# Patient Record
Sex: Female | Born: 1960 | Race: White | Hispanic: No | Marital: Married | Smoking: Never smoker
Health system: Southern US, Community
[De-identification: ages and names within clinical notes are randomized; demographics above are authoritative.]

## PROBLEM LIST (undated history)

## (undated) DIAGNOSIS — R51 Headache: Secondary | ICD-10-CM

## (undated) DIAGNOSIS — R112 Nausea with vomiting, unspecified: Secondary | ICD-10-CM

## (undated) DIAGNOSIS — I951 Orthostatic hypotension: Secondary | ICD-10-CM

## (undated) DIAGNOSIS — M359 Systemic involvement of connective tissue, unspecified: Secondary | ICD-10-CM

## (undated) DIAGNOSIS — R000 Tachycardia, unspecified: Secondary | ICD-10-CM

## (undated) DIAGNOSIS — Z85528 Personal history of other malignant neoplasm of kidney: Secondary | ICD-10-CM

## (undated) DIAGNOSIS — C801 Malignant (primary) neoplasm, unspecified: Secondary | ICD-10-CM

## (undated) DIAGNOSIS — I10 Essential (primary) hypertension: Secondary | ICD-10-CM

## (undated) DIAGNOSIS — R5382 Chronic fatigue, unspecified: Secondary | ICD-10-CM

## (undated) DIAGNOSIS — R519 Headache, unspecified: Secondary | ICD-10-CM

## (undated) DIAGNOSIS — J45909 Unspecified asthma, uncomplicated: Secondary | ICD-10-CM

## (undated) DIAGNOSIS — J069 Acute upper respiratory infection, unspecified: Secondary | ICD-10-CM

## (undated) DIAGNOSIS — M797 Fibromyalgia: Secondary | ICD-10-CM

## (undated) DIAGNOSIS — N3010 Interstitial cystitis (chronic) without hematuria: Secondary | ICD-10-CM

## (undated) DIAGNOSIS — G90A Postural orthostatic tachycardia syndrome (POTS): Secondary | ICD-10-CM

## (undated) DIAGNOSIS — A0811 Acute gastroenteropathy due to Norwalk agent: Secondary | ICD-10-CM

## (undated) DIAGNOSIS — J189 Pneumonia, unspecified organism: Secondary | ICD-10-CM

## (undated) DIAGNOSIS — E039 Hypothyroidism, unspecified: Secondary | ICD-10-CM

## (undated) DIAGNOSIS — M1990 Unspecified osteoarthritis, unspecified site: Secondary | ICD-10-CM

## (undated) DIAGNOSIS — L509 Urticaria, unspecified: Secondary | ICD-10-CM

## (undated) DIAGNOSIS — G709 Myoneural disorder, unspecified: Secondary | ICD-10-CM

## (undated) DIAGNOSIS — K219 Gastro-esophageal reflux disease without esophagitis: Secondary | ICD-10-CM

## (undated) DIAGNOSIS — Z9889 Other specified postprocedural states: Secondary | ICD-10-CM

## (undated) DIAGNOSIS — E079 Disorder of thyroid, unspecified: Secondary | ICD-10-CM

## (undated) DIAGNOSIS — I498 Other specified cardiac arrhythmias: Secondary | ICD-10-CM

## (undated) HISTORY — PX: TONSILLECTOMY: SUR1361

## (undated) HISTORY — PX: BREAST BIOPSY: SHX20

## (undated) HISTORY — DX: Unspecified osteoarthritis, unspecified site: M19.90

## (undated) HISTORY — PX: CYSTOSCOPY: SUR368

## (undated) HISTORY — DX: Disorder of thyroid, unspecified: E07.9

## (undated) HISTORY — DX: Acute upper respiratory infection, unspecified: J06.9

## (undated) HISTORY — DX: Urticaria, unspecified: L50.9

## (undated) HISTORY — DX: Malignant (primary) neoplasm, unspecified: C80.1

## (undated) HISTORY — PX: OTHER SURGICAL HISTORY: SHX169

## (undated) HISTORY — PX: COLONOSCOPY: SHX174

## (undated) HISTORY — PX: GASTRECTOMY: SHX58

## (undated) HISTORY — DX: Systemic involvement of connective tissue, unspecified: M35.9

## (undated) HISTORY — DX: Unspecified asthma, uncomplicated: J45.909

---

## 1997-02-14 HISTORY — PX: ABDOMINAL HYSTERECTOMY: SHX81

## 1997-04-21 ENCOUNTER — Inpatient Hospital Stay (HOSPITAL_COMMUNITY): Admission: RE | Admit: 1997-04-21 | Discharge: 1997-04-23 | Payer: Self-pay | Admitting: Obstetrics and Gynecology

## 1998-09-18 ENCOUNTER — Other Ambulatory Visit: Admission: RE | Admit: 1998-09-18 | Discharge: 1998-09-18 | Payer: Self-pay | Admitting: Obstetrics and Gynecology

## 2000-01-21 ENCOUNTER — Encounter: Payer: Self-pay | Admitting: Internal Medicine

## 2000-01-21 ENCOUNTER — Encounter: Admission: RE | Admit: 2000-01-21 | Discharge: 2000-01-21 | Payer: Self-pay | Admitting: Internal Medicine

## 2001-06-11 ENCOUNTER — Encounter: Payer: Self-pay | Admitting: Emergency Medicine

## 2001-06-11 ENCOUNTER — Emergency Department (HOSPITAL_COMMUNITY): Admission: EM | Admit: 2001-06-11 | Discharge: 2001-06-11 | Payer: Self-pay | Admitting: *Deleted

## 2002-05-06 ENCOUNTER — Emergency Department (HOSPITAL_COMMUNITY): Admission: EM | Admit: 2002-05-06 | Discharge: 2002-05-06 | Payer: Self-pay | Admitting: Emergency Medicine

## 2002-06-13 ENCOUNTER — Other Ambulatory Visit: Admission: RE | Admit: 2002-06-13 | Discharge: 2002-06-13 | Payer: Self-pay | Admitting: Obstetrics and Gynecology

## 2003-03-19 ENCOUNTER — Encounter: Admission: RE | Admit: 2003-03-19 | Discharge: 2003-03-19 | Payer: Self-pay | Admitting: Internal Medicine

## 2003-07-10 ENCOUNTER — Other Ambulatory Visit: Admission: RE | Admit: 2003-07-10 | Discharge: 2003-07-10 | Payer: Self-pay | Admitting: Obstetrics and Gynecology

## 2004-05-27 ENCOUNTER — Other Ambulatory Visit: Admission: RE | Admit: 2004-05-27 | Discharge: 2004-05-27 | Payer: Self-pay | Admitting: Obstetrics and Gynecology

## 2004-05-31 ENCOUNTER — Encounter: Admission: RE | Admit: 2004-05-31 | Discharge: 2004-05-31 | Payer: Self-pay | Admitting: Internal Medicine

## 2004-10-11 ENCOUNTER — Encounter: Admission: RE | Admit: 2004-10-11 | Discharge: 2004-10-11 | Payer: Self-pay | Admitting: Internal Medicine

## 2005-03-31 ENCOUNTER — Other Ambulatory Visit: Admission: RE | Admit: 2005-03-31 | Discharge: 2005-03-31 | Payer: Self-pay | Admitting: Obstetrics and Gynecology

## 2005-04-22 ENCOUNTER — Encounter: Admission: RE | Admit: 2005-04-22 | Discharge: 2005-04-22 | Payer: Self-pay | Admitting: Internal Medicine

## 2005-08-30 ENCOUNTER — Encounter: Admission: RE | Admit: 2005-08-30 | Discharge: 2005-08-30 | Payer: Self-pay | Admitting: Otolaryngology

## 2006-05-10 ENCOUNTER — Emergency Department (HOSPITAL_COMMUNITY): Admission: EM | Admit: 2006-05-10 | Discharge: 2006-05-11 | Payer: Self-pay | Admitting: Emergency Medicine

## 2006-11-13 ENCOUNTER — Encounter: Admission: RE | Admit: 2006-11-13 | Discharge: 2006-11-13 | Payer: Self-pay | Admitting: Gastroenterology

## 2008-03-18 ENCOUNTER — Ambulatory Visit: Payer: Self-pay | Admitting: Pulmonary Disease

## 2008-03-18 DIAGNOSIS — G43909 Migraine, unspecified, not intractable, without status migrainosus: Secondary | ICD-10-CM | POA: Insufficient documentation

## 2008-03-18 DIAGNOSIS — R059 Cough, unspecified: Secondary | ICD-10-CM | POA: Insufficient documentation

## 2008-03-18 DIAGNOSIS — R05 Cough: Secondary | ICD-10-CM

## 2008-03-18 DIAGNOSIS — I10 Essential (primary) hypertension: Secondary | ICD-10-CM | POA: Insufficient documentation

## 2008-03-28 ENCOUNTER — Ambulatory Visit (HOSPITAL_BASED_OUTPATIENT_CLINIC_OR_DEPARTMENT_OTHER): Admission: RE | Admit: 2008-03-28 | Discharge: 2008-03-28 | Payer: Self-pay | Admitting: Pulmonary Disease

## 2008-03-28 ENCOUNTER — Ambulatory Visit: Payer: Self-pay | Admitting: Diagnostic Radiology

## 2008-03-28 ENCOUNTER — Telehealth (INDEPENDENT_AMBULATORY_CARE_PROVIDER_SITE_OTHER): Payer: Self-pay | Admitting: *Deleted

## 2009-01-21 ENCOUNTER — Encounter: Admission: RE | Admit: 2009-01-21 | Discharge: 2009-01-21 | Payer: Self-pay | Admitting: Internal Medicine

## 2009-02-14 HISTORY — PX: KIDNEY SURGERY: SHX687

## 2009-03-05 ENCOUNTER — Ambulatory Visit: Payer: Self-pay | Admitting: Oncology

## 2009-03-16 ENCOUNTER — Emergency Department (HOSPITAL_BASED_OUTPATIENT_CLINIC_OR_DEPARTMENT_OTHER): Admission: EM | Admit: 2009-03-16 | Discharge: 2009-03-16 | Payer: Self-pay | Admitting: Emergency Medicine

## 2009-03-20 LAB — BASIC METABOLIC PANEL
CO2: 27 mEq/L (ref 19–32)
Calcium: 9 mg/dL (ref 8.4–10.5)
Glucose, Bld: 120 mg/dL — ABNORMAL HIGH (ref 70–99)
Potassium: 3.3 mEq/L — ABNORMAL LOW (ref 3.5–5.3)
Sodium: 139 mEq/L (ref 135–145)

## 2009-03-23 ENCOUNTER — Ambulatory Visit (HOSPITAL_COMMUNITY): Admission: RE | Admit: 2009-03-23 | Discharge: 2009-03-23 | Payer: Self-pay | Admitting: Oncology

## 2009-03-26 LAB — BASIC METABOLIC PANEL
BUN: 21 mg/dL (ref 6–23)
CO2: 20 mEq/L (ref 19–32)
Calcium: 9.3 mg/dL (ref 8.4–10.5)
Creatinine, Ser: 1.09 mg/dL (ref 0.40–1.20)
Glucose, Bld: 94 mg/dL (ref 70–99)
Potassium: 4.2 mEq/L (ref 3.5–5.3)

## 2009-08-12 ENCOUNTER — Ambulatory Visit: Payer: Self-pay | Admitting: Oncology

## 2009-09-15 ENCOUNTER — Ambulatory Visit: Payer: Self-pay | Admitting: Oncology

## 2009-11-09 ENCOUNTER — Encounter: Admission: RE | Admit: 2009-11-09 | Discharge: 2009-11-09 | Payer: Self-pay | Admitting: Orthopedic Surgery

## 2010-03-07 ENCOUNTER — Encounter: Payer: Self-pay | Admitting: Otolaryngology

## 2010-03-07 ENCOUNTER — Encounter: Payer: Self-pay | Admitting: Oncology

## 2010-05-02 LAB — BASIC METABOLIC PANEL
BUN: 12 mg/dL (ref 6–23)
Calcium: 9 mg/dL (ref 8.4–10.5)
Creatinine, Ser: 0.8 mg/dL (ref 0.4–1.2)
GFR calc non Af Amer: >60 mL/min (ref >60)
Glucose, Bld: 90 mg/dL (ref 70–99)

## 2010-05-02 LAB — CBC
HCT: 39.6 % (ref 36.0–46.0)
MCHC: 34.5 g/dL (ref 30.0–36.0)
MCV: 86.4 fL (ref 78.0–100.0)
RBC: 4.58 MIL/uL (ref 3.87–5.11)

## 2010-05-02 LAB — DIFFERENTIAL
Basophils Absolute: 0 10*3/uL (ref 0.0–0.1)
Eosinophils Absolute: 0.2 10*3/uL (ref 0.0–0.7)
Lymphs Abs: 2.7 10*3/uL (ref 0.7–4.0)
Monocytes Relative: 10 % (ref 3–12)
Neutro Abs: 4.3 10*3/uL (ref 1.7–7.7)

## 2010-05-02 LAB — WOUND CULTURE: Gram Stain: NONE SEEN

## 2010-08-30 ENCOUNTER — Ambulatory Visit (HOSPITAL_BASED_OUTPATIENT_CLINIC_OR_DEPARTMENT_OTHER): Payer: 59 | Attending: Rheumatology

## 2010-08-30 DIAGNOSIS — G4710 Hypersomnia, unspecified: Secondary | ICD-10-CM | POA: Insufficient documentation

## 2010-09-04 DIAGNOSIS — G4730 Sleep apnea, unspecified: Secondary | ICD-10-CM

## 2010-09-04 DIAGNOSIS — G4710 Hypersomnia, unspecified: Secondary | ICD-10-CM

## 2010-09-04 NOTE — Procedures (Signed)
Angel Sellers, Angel Sellers                ACCOUNT NO.:  [id number]  MEDICAL RECORD NO.:  [id number]          PATIENT TYPE:  OUT  LOCATION:  SLEEP CENTER                 FACILITY:  [facility]  PHYSICIAN:  Dillon Mcreynolds D. Maple Hudson, MD, FCCP, FACPDATE OF BIRTH:  07-28-60  DATE OF STUDY:  08/30/2010                           NOCTURNAL POLYSOMNOGRAM  REFERRING PHYSICIAN:  Pollyann Savoy, M.D.  INDICATION FOR STUDY:  Hypersomnia with sleep apnea.  EPWORTH SLEEPINESS SCORE:  18/24.  BMI of 24.5.  Weight 147 pounds, height 65 inches.  Neck 13 inches.  MEDICATIONS:  Home medications are charted and reviewed.  SLEEP ARCHITECTURE:  Total sleep time 359.5 minutes with sleep efficiency 89.5%.  Stage I was 3.6%, stage II 70.2%, stage III 20.9%, REM 5.3% of total sleep time.  Sleep latency 19.5 minutes, REM latency 246 minutes, awake after sleep onset 22.5 minutes, arousal index 18.5.  BEDTIME MEDICATION:  None.  RESPIRATORY DATA:  Apnea/hypopnea index (AHI) 0.7 per hour.  A total of four events was scored including two central apneas and two hypopneas. Events were not positional.  OXYGEN DATA:  Moderate snoring with oxygen desaturation to a nadir of 90% and a mean oxygen saturation through the study of 95.1% on room air.  CARDIAC DATA:  Normal sinus rhythm.  MOVEMENT-PARASOMNIA:  No significant movement disturbance.  Bathroom x1.  IMPRESSIONS-RECOMMENDATIONS: 1. Sleep architecture was noteworthy for relatively high percentage of     time spent in stage III and low percentage of time spent in REM;     however, recognized this is a single night in an unfamiliar     environment, so these proportions may be unrepresentative. 2. Occasional respiratory event with sleep disturbance, within normal     limits, AHI 0.7 per hour (normal range 0-5 per hour).  Moderate     snoring with oxygen desaturation to a nadir of 90% and a mean     oxygen saturation     through the study of 95.1% on room air. 3. No specific  sleep disorder or therapeutic intervention is indicated     by the study.     Jmichael Gille D. Maple Hudson, MD, FCCP, FACP Diplomate, Biomedical engineer of Sleep Medicine Electronically Signed    CDY/MEDQ  D:  09/04/2010 13:50:00  T:  09/04/2010 22:45:18  Job:  [id number]

## 2011-05-04 ENCOUNTER — Other Ambulatory Visit: Payer: Self-pay

## 2011-05-04 ENCOUNTER — Emergency Department (HOSPITAL_BASED_OUTPATIENT_CLINIC_OR_DEPARTMENT_OTHER)
Admission: EM | Admit: 2011-05-04 | Discharge: 2011-05-04 | Disposition: A | Discharge status: Home / Self Care | Payer: 59 | Attending: Emergency Medicine | Admitting: Emergency Medicine

## 2011-05-04 ENCOUNTER — Encounter (HOSPITAL_BASED_OUTPATIENT_CLINIC_OR_DEPARTMENT_OTHER): Payer: Self-pay | Admitting: Student

## 2011-05-04 DIAGNOSIS — R55 Syncope and collapse: Secondary | ICD-10-CM | POA: Insufficient documentation

## 2011-05-04 DIAGNOSIS — Z85528 Personal history of other malignant neoplasm of kidney: Secondary | ICD-10-CM | POA: Insufficient documentation

## 2011-05-04 DIAGNOSIS — Z79899 Other long term (current) drug therapy: Secondary | ICD-10-CM | POA: Insufficient documentation

## 2011-05-04 DIAGNOSIS — R197 Diarrhea, unspecified: Secondary | ICD-10-CM | POA: Insufficient documentation

## 2011-05-04 DIAGNOSIS — R110 Nausea: Secondary | ICD-10-CM | POA: Insufficient documentation

## 2011-05-04 HISTORY — DX: Fibromyalgia: M79.7

## 2011-05-04 HISTORY — DX: Other specified cardiac arrhythmias: I49.8

## 2011-05-04 HISTORY — DX: Personal history of other malignant neoplasm of kidney: Z85.528

## 2011-05-04 HISTORY — DX: Orthostatic hypotension: I95.1

## 2011-05-04 HISTORY — DX: Postural orthostatic tachycardia syndrome (POTS): G90.A

## 2011-05-04 HISTORY — DX: Tachycardia, unspecified: R00.0

## 2011-05-04 LAB — COMPREHENSIVE METABOLIC PANEL
ALT: 14 U/L (ref 0–35)
AST: 18 U/L (ref 0–37)
Albumin: 4.5 g/dL (ref 3.5–5.2)
Alkaline Phosphatase: 57 U/L (ref 39–117)
BUN: 16 mg/dL (ref 6–23)
Creatinine, Ser: 0.9 mg/dL (ref 0.50–1.10)
GFR calc Af Amer: 85 mL/min — ABNORMAL LOW (ref >90)
Sodium: 137 mEq/L (ref 135–145)
Total Protein: 7.9 g/dL (ref 6.0–8.3)

## 2011-05-04 LAB — URINALYSIS, ROUTINE W REFLEX MICROSCOPIC
Hgb urine dipstick: NEGATIVE
Ketones, ur: NEGATIVE mg/dL
Nitrite: NEGATIVE
Protein, ur: NEGATIVE mg/dL

## 2011-05-04 LAB — CBC
MCV: 84 fL (ref 78.0–100.0)
Platelets: 177 10*3/uL (ref 150–400)
RBC: 5.01 MIL/uL (ref 3.87–5.11)
RDW: 13.3 % (ref 11.5–15.5)

## 2011-05-04 MED ORDER — DIPHENOXYLATE-ATROPINE 2.5-0.025 MG PO TABS
2.0000 | ORAL_TABLET | Freq: Once | ORAL | Status: AC
  Administered 2011-05-04: 2 via ORAL
  Filled 2011-05-04: qty 2

## 2011-05-04 MED ORDER — SODIUM CHLORIDE 0.9 % IV BOLUS (SEPSIS)
1000.0000 mL | Freq: Once | INTRAVENOUS | Status: AC
  Administered 2011-05-04: 1000 mL via INTRAVENOUS

## 2011-05-04 MED ORDER — ONDANSETRON HCL 4 MG/2ML IJ SOLN
4.0000 mg | Freq: Once | INTRAMUSCULAR | Status: AC
  Administered 2011-05-04: 4 mg via INTRAVENOUS
  Filled 2011-05-04: qty 2

## 2011-05-04 MED ORDER — SODIUM CHLORIDE 0.9 % IV BOLUS (SEPSIS)
2000.0000 mL | Freq: Once | INTRAVENOUS | Status: AC
  Administered 2011-05-04: 1000 mL via INTRAVENOUS

## 2011-05-04 NOTE — ED Provider Notes (Signed)
History     CSN: [id number]  Arrival date & time 05/04/11  1654   First MD Initiated Contact with Patient 05/04/11 1716      Chief Complaint  Patient presents with  . Loss of Consciousness  . Diarrhea    (Consider location/radiation/quality/duration/timing/severity/associated sxs/prior treatment) HPI Comments: Pt states that for the last 2 days she has had watery diarrhea to many episodes to count:pt states some discomfort prior to each diarrhea:pt states that she was scheduled for a tilt test today because of her history of orthostatic vitals and she passed out while in the office:pt states that she stood up here after diarrhea and had another syncopal episode  Patient is a 51 y.o. female presenting with diarrhea and syncope. The history is provided by the patient. No language interpreter was used.  Diarrhea The primary symptoms include nausea and diarrhea. Primary symptoms do not include fever or vomiting. The illness began 2 days ago. The onset was sudden. The problem has not changed since onset. The illness does not include constipation or back pain.  Loss of Consciousness This is a new problem. The current episode started today. The problem occurs 2 to 4 times per day. The problem has been unchanged. Associated symptoms include nausea. Pertinent negatives include no fever or vomiting. The symptoms are aggravated by standing. She has tried nothing for the symptoms.    Past Medical History  Diagnosis Date  . History of kidney cancer   . Fibromyalgia   . POTS (postural orthostatic tachycardia syndrome)     Past Surgical History  Procedure Date  . Kidney surgery     History reviewed. No pertinent family history.  History  Substance Use Topics  . Smoking status: Never Smoker   . Smokeless tobacco: Not on file  . Alcohol Use: No    OB History    Grav Para Term Preterm Abortions TAB SAB Ect Mult Living                  Review of Systems  Constitutional: Negative  for fever.  Cardiovascular: Positive for syncope.  Gastrointestinal: Positive for nausea and diarrhea. Negative for vomiting and constipation.  Musculoskeletal: Negative for back pain.  All other systems reviewed and are negative.    Allergies  Morphine  Home Medications   Current Outpatient Rx  Name Route Sig Dispense Refill  . VITAMIN B COMPLEX IJ Injection Inject 1 mL as directed 4 (four) times a week.    . DULOXETINE HCL 60 MG PO CPEP Oral Take 60 mg by mouth daily.    Marland Kitchen LEVOTHYROXINE SODIUM 75 MCG PO TABS Oral Take 75 mcg by mouth daily.    Marland Kitchen METHOCARBAMOL 500 MG PO TABS Oral Take 500 mg by mouth 4 (four) times daily. Patient uses this medication for pain.  She also states that she takes this medication prn.    . OMEPRAZOLE 40 MG PO CPDR Oral Take 40 mg by mouth daily.    Marland Kitchen PENTOSAN POLYSULFATE SODIUM 100 MG PO CAPS Oral Take 100 mg by mouth 3 (three) times daily before meals.    . STUDY MEDICATION Oral Take 1.5 mg by mouth daily.    . TOPIRAMATE 100 MG PO TABS Oral Take 100 mg by mouth 2 (two) times daily.      BP 142/75  Pulse 113  Temp(Src) 98.4 F (36.9 C) (Oral)  Resp 20  Ht 5\' 5"  (1.651 m)  Wt 165 lb (74.844 kg)  BMI 27.46 kg/m2  SpO2 100%  Physical Exam  Nursing note and vitals reviewed. Constitutional: She is oriented to person, place, and time. She appears well-developed and well-nourished.  HENT:  Head: Atraumatic.  Eyes: Conjunctivae and EOM are normal.  Neck: Neck supple.  Cardiovascular: Normal rate and regular rhythm.   Pulmonary/Chest: Effort normal and breath sounds normal.  Abdominal: Soft. There is no tenderness.  Musculoskeletal: Normal range of motion.  Neurological: She is alert and oriented to person, place, and time.  Skin: Skin is warm and dry.  Psychiatric: She has a normal mood and affect.    ED Course  Procedures (including critical care time)  Labs Reviewed  COMPREHENSIVE METABOLIC PANEL - Abnormal; Notable for the following:     Glucose, Bld 129 (*)    GFR calc non Af Amer 73 (*)    GFR calc Af Amer 85 (*)    All other components within normal limits  URINALYSIS, ROUTINE W REFLEX MICROSCOPIC  CBC   No results found.  Date: 05/04/2011  Rate: 94  Rhythm: normal sinus rhythm  QRS Axis: normal  Intervals: normal  ST/T Wave abnormalities: nonspecific T wave changes  Conduction Disutrbances:none  Narrative Interpretation:   Old EKG Reviewed: none available    1. Diarrhea   2. Syncope       MDM  Pt is feeling better at this time:pt has not had diarrhea since being here today:pt is able to ambulate without any symptoms      Teressa Lower, NP 05/04/11 2030

## 2011-05-04 NOTE — ED Notes (Signed)
Pt. Still reporting some nausea but no vomiting noted.

## 2011-05-04 NOTE — ED Notes (Signed)
Pt. Has had no diarrhea in room.

## 2011-05-04 NOTE — ED Notes (Signed)
Family at bedside. 

## 2011-05-04 NOTE — ED Notes (Signed)
Attempted In and Out cath on Pt. With no success due to Pt. Is dehydrated.  Pt. Is being hydrated via IV NS

## 2011-05-04 NOTE — ED Notes (Signed)
Pt. Asking for crackers and soda

## 2011-05-04 NOTE — ED Notes (Signed)
Pt to restroom, advised to not ambulate and to pull alert string when finished. Pt non compliant and tried ambulating and had syncopal episode. Pt placed in bed and taken to room 8. Pt able to ambulate from floor to bed with assist x 1.

## 2011-05-04 NOTE — Discharge Instructions (Signed)
B.R.A.T. Diet Your doctor has recommended the B.R.A.T. diet for you or your child until the condition improves. This is often used to help control diarrhea and vomiting symptoms. If you or your child can tolerate clear liquids, you may have:  Bananas.   Rice.   Applesauce.   Toast (and other simple starches such as crackers, potatoes, noodles).  Be sure to avoid dairy products, meats, and fatty foods until symptoms are better. Fruit juices such as apple, grape, and prune juice can make diarrhea worse. Avoid these. Continue this diet for 2 days or as instructed by your caregiver. Document Released: 01/31/2005 Document Revised: 01/20/2011 Document Reviewed: 07/20/2006 [facility] Patient Information 2012 [place], [place].Diarrhea Infections caused by germs (bacterial) or a virus commonly cause diarrhea. Your caregiver has determined that with time, rest and fluids, the diarrhea should improve. In general, eat normally while drinking more water than usual. Although water may prevent dehydration, it does not contain salt and minerals (electrolytes). Broths, weak tea without caffeine and oral rehydration solutions (ORS) replace fluids and electrolytes. Small amounts of fluids should be taken frequently. Large amounts at one time may not be tolerated. Plain water may be harmful in infants and the elderly. Oral rehydrating solutions (ORS) are available at pharmacies and grocery stores. ORS replace water and important electrolytes in proper proportions. Sports drinks are not as effective as ORS and may be harmful due to sugars worsening diarrhea.  ORS is especially recommended for use in children with diarrhea. As a general guideline for children, replace any new fluid losses from diarrhea and/or vomiting with ORS as follows:   If your child weighs 22 pounds or under (10 kg or less), give 60-120 mL ( -  cup or 2 - 4 ounces) of ORS for each episode of diarrheal stool or vomiting episode.   If your child  weighs more than 22 pounds (more than 10 kgs), give 120-240 mL ( - 1 cup or 4 - 8 ounces) of ORS for each diarrheal stool or episode of vomiting.   While correcting for dehydration, children should eat normally. However, foods high in sugar should be avoided because this may worsen diarrhea. Large amounts of carbonated soft drinks, juice, gelatin desserts and other highly sugared drinks should be avoided.   After correction of dehydration, other liquids that are appealing to the child may be added. Children should drink small amounts of fluids frequently and fluids should be increased as tolerated. Children should drink enough fluids to keep urine clear or pale yellow.   Adults should eat normally while drinking more fluids than usual. Drink small amounts of fluids frequently and increase as tolerated. Drink enough fluids to keep urine clear or pale yellow. Broths, weak decaffeinated tea, lemon lime soft drinks (allowed to go flat) and ORS replace fluids and electrolytes.   Avoid:   Carbonated drinks.   Juice.   Extremely hot or cold fluids.   Caffeine drinks.   Fatty, greasy foods.   Alcohol.   Tobacco.   Too much intake of anything at one time.   Gelatin desserts.   Probiotics are active cultures of beneficial bacteria. They may lessen the amount and number of diarrheal stools in adults. Probiotics can be found in yogurt with active cultures and in supplements.   Wash hands well to avoid spreading bacteria and virus.   Anti-diarrheal medications are not recommended for infants and children.   Only take over-the-counter or prescription medicines for pain, discomfort or fever as directed by your  caregiver. Do not give aspirin to children because it may cause Reye's Syndrome.   For adults, ask your caregiver if you should continue all prescribed and over-the-counter medicines.   If your caregiver has given you a follow-up appointment, it is very important to keep that  appointment. Not keeping the appointment could result in a chronic or permanent injury, and disability. If there is any problem keeping the appointment, you must call back to this facility for assistance.  SEEK IMMEDIATE MEDICAL CARE IF:   You or your child is unable to keep fluids down or other symptoms or problems become worse in spite of treatment.   Vomiting or diarrhea develops and becomes persistent.   There is vomiting of blood or bile (green material).   There is blood in the stool or the stools are black and tarry.   There is no urine output in 6-8 hours or there is only a small amount of very dark urine.   Abdominal pain develops, increases or localizes.   You have a fever.   Your baby is older than 3 months with a rectal temperature of 102 F (38.9 C) or higher.   Your baby is 13 months old or younger with a rectal temperature of 100.4 F (38 C) or higher.   You or your child develops excessive weakness, dizziness, fainting or extreme thirst.   You or your child develops a rash, stiff neck, severe headache or become irritable or sleepy and difficult to awaken.  MAKE SURE YOU:   Understand these instructions.   Will watch your condition.   Will get help right away if you are not doing well or get worse.  Document Released: 01/21/2002 Document Revised: 01/20/2011 Document Reviewed: 12/08/2008 [facility] Patient Information 2012 [place], [place].Syncope You have had a fainting (syncopal) spell. A fainting episode is a sudden, short-lived loss of consciousness. It results in complete recovery. It occurs because there has been a temporary shortage of oxygen and/or sugar (glucose) to the brain. CAUSES   Blood pressure pills and other medications that may lower blood pressure below normal. Sudden changes in posture (sudden standing).   Over-medication. Take your medications as directed.   Standing too long. This can cause blood to pool in the legs.   Seizure disorders.    Low blood sugar (hypoglycemia) of diabetes. This more commonly causes coma.   Bearing down to go to the bathroom. This can cause your blood pressure to rise suddenly. Your body compensates by making the blood pressure too low when you stop bearing down.   Hardening of the arteries where the brain temporarily does not receive enough blood.   Irregular heart beat and circulatory problems.   Fear, emotional distress, injury, sight of blood, or illness.  Your caregiver will send you home if the syncope was from non-worrisome causes (benign). Depending on your age and health, you may stay to be monitored and observed. If you return home, have someone stay with you if your caregiver feels that is desirable. It is very important to keep all follow-up referrals and appointments in order to properly manage this condition. This is a serious problem which can lead to serious illness and death if not carefully managed.  WARNING: Do not drive or operate machinery until your caregiver feels that it is safe for you to do so. SEEK IMMEDIATE MEDICAL CARE IF:   You have another fainting episode or faint while lying or sitting down. DO NOT DRIVE YOURSELF. Call 911 if no other  help is available.   You have chest pain, are feeling sick to your stomach (nausea), vomiting or abdominal pain.   You have an irregular heartbeat or one that is very fast (pulse over 120 beats per minute).   You have a loss of feeling in some part of your body or lose movement in your arms or legs.   You have difficulty with speech, confusion, severe weakness, or visual problems.   You become sweaty and/or feel light headed.  Make sure you are rechecked as instructed. Document Released: 01/31/2005 Document Revised: 01/20/2011 Document Reviewed: 09/21/2006 [facility] Patient Information 2012 [place], [place].

## 2011-05-04 NOTE — ED Notes (Signed)
Pt in with c/o N D x 48 hrs with syncopal episode at PCP office.

## 2011-05-05 NOTE — ED Provider Notes (Signed)
Medical screening examination/treatment/procedure(s) were performed by non-physician practitioner and as supervising physician I was immediately available for consultation/collaboration.   Keilynn Marano, MD 05/05/11 1537 

## 2011-05-10 ENCOUNTER — Other Ambulatory Visit: Payer: Self-pay | Admitting: Neurology

## 2011-05-10 DIAGNOSIS — R413 Other amnesia: Secondary | ICD-10-CM

## 2011-05-13 ENCOUNTER — Ambulatory Visit
Admission: RE | Admit: 2011-05-13 | Discharge: 2011-05-13 | Disposition: A | Discharge status: Home / Self Care | Payer: 59 | Source: Ambulatory Visit | Attending: Neurology | Admitting: Neurology

## 2011-05-13 DIAGNOSIS — R413 Other amnesia: Secondary | ICD-10-CM

## 2011-09-07 DIAGNOSIS — R5382 Chronic fatigue, unspecified: Secondary | ICD-10-CM | POA: Insufficient documentation

## 2011-09-07 DIAGNOSIS — C641 Malignant neoplasm of right kidney, except renal pelvis: Secondary | ICD-10-CM | POA: Insufficient documentation

## 2011-09-07 DIAGNOSIS — IMO0002 Reserved for concepts with insufficient information to code with codable children: Secondary | ICD-10-CM | POA: Insufficient documentation

## 2011-09-07 DIAGNOSIS — G43709 Chronic migraine without aura, not intractable, without status migrainosus: Secondary | ICD-10-CM | POA: Insufficient documentation

## 2011-09-07 DIAGNOSIS — N3010 Interstitial cystitis (chronic) without hematuria: Secondary | ICD-10-CM | POA: Insufficient documentation

## 2011-09-07 DIAGNOSIS — K589 Irritable bowel syndrome without diarrhea: Secondary | ICD-10-CM | POA: Insufficient documentation

## 2012-01-23 ENCOUNTER — Other Ambulatory Visit: Payer: Self-pay | Admitting: Family Medicine

## 2012-01-23 DIAGNOSIS — M549 Dorsalgia, unspecified: Secondary | ICD-10-CM

## 2012-01-24 ENCOUNTER — Ambulatory Visit
Admission: RE | Admit: 2012-01-24 | Discharge: 2012-01-24 | Disposition: A | Discharge status: Home / Self Care | Payer: 59 | Source: Ambulatory Visit | Attending: Family Medicine | Admitting: Family Medicine

## 2012-01-24 DIAGNOSIS — M549 Dorsalgia, unspecified: Secondary | ICD-10-CM

## 2012-05-28 ENCOUNTER — Emergency Department (HOSPITAL_COMMUNITY)
Admission: EM | Admit: 2012-05-28 | Discharge: 2012-05-28 | Discharge status: Against Medical Advice | Payer: 59 | Attending: Emergency Medicine | Admitting: Emergency Medicine

## 2012-05-28 ENCOUNTER — Encounter (HOSPITAL_COMMUNITY): Payer: Self-pay | Admitting: *Deleted

## 2012-05-28 DIAGNOSIS — M797 Fibromyalgia: Secondary | ICD-10-CM | POA: Insufficient documentation

## 2012-05-28 DIAGNOSIS — R000 Tachycardia, unspecified: Secondary | ICD-10-CM | POA: Insufficient documentation

## 2012-05-28 DIAGNOSIS — I498 Other specified cardiac arrhythmias: Secondary | ICD-10-CM | POA: Insufficient documentation

## 2012-05-28 DIAGNOSIS — G8929 Other chronic pain: Secondary | ICD-10-CM | POA: Insufficient documentation

## 2012-05-28 NOTE — ED Notes (Signed)
Pt had injection in elbow this morning by Dr Lajoyce Corners; went to [facility] for fibromyalgia appointment; had heartrate up to 136 at appt; had episodes of dizziness; states has been between 120-135; denies chest pain

## 2012-11-18 ENCOUNTER — Encounter (HOSPITAL_COMMUNITY): Payer: Self-pay | Admitting: *Deleted

## 2012-11-18 ENCOUNTER — Emergency Department (HOSPITAL_COMMUNITY): Payer: PRIVATE HEALTH INSURANCE

## 2012-11-18 ENCOUNTER — Emergency Department (HOSPITAL_COMMUNITY)
Admission: EM | Admit: 2012-11-18 | Discharge: 2012-11-18 | Disposition: A | Discharge status: Home / Self Care | Payer: PRIVATE HEALTH INSURANCE | Attending: Emergency Medicine | Admitting: Emergency Medicine

## 2012-11-18 DIAGNOSIS — I10 Essential (primary) hypertension: Secondary | ICD-10-CM | POA: Insufficient documentation

## 2012-11-18 DIAGNOSIS — K219 Gastro-esophageal reflux disease without esophagitis: Secondary | ICD-10-CM | POA: Insufficient documentation

## 2012-11-18 DIAGNOSIS — Y9289 Other specified places as the place of occurrence of the external cause: Secondary | ICD-10-CM | POA: Insufficient documentation

## 2012-11-18 DIAGNOSIS — S0993XA Unspecified injury of face, initial encounter: Secondary | ICD-10-CM | POA: Insufficient documentation

## 2012-11-18 DIAGNOSIS — X500XXA Overexertion from strenuous movement or load, initial encounter: Secondary | ICD-10-CM | POA: Insufficient documentation

## 2012-11-18 DIAGNOSIS — M542 Cervicalgia: Secondary | ICD-10-CM

## 2012-11-18 DIAGNOSIS — Z8679 Personal history of other diseases of the circulatory system: Secondary | ICD-10-CM | POA: Insufficient documentation

## 2012-11-18 DIAGNOSIS — Z85528 Personal history of other malignant neoplasm of kidney: Secondary | ICD-10-CM | POA: Insufficient documentation

## 2012-11-18 DIAGNOSIS — Y9301 Activity, walking, marching and hiking: Secondary | ICD-10-CM | POA: Insufficient documentation

## 2012-11-18 DIAGNOSIS — Z79899 Other long term (current) drug therapy: Secondary | ICD-10-CM | POA: Insufficient documentation

## 2012-11-18 DIAGNOSIS — IMO0001 Reserved for inherently not codable concepts without codable children: Secondary | ICD-10-CM | POA: Insufficient documentation

## 2012-11-18 HISTORY — DX: Gastro-esophageal reflux disease without esophagitis: K21.9

## 2012-11-18 HISTORY — DX: Essential (primary) hypertension: I10

## 2012-11-18 MED ORDER — FENTANYL CITRATE 0.05 MG/ML IJ SOLN
100.0000 ug | Freq: Once | INTRAMUSCULAR | Status: AC
  Administered 2012-11-18: 100 ug via INTRAVENOUS
  Filled 2012-11-18: qty 2

## 2012-11-18 MED ORDER — ONDANSETRON HCL 4 MG/2ML IJ SOLN
4.0000 mg | Freq: Once | INTRAMUSCULAR | Status: AC
  Administered 2012-11-18: 4 mg via INTRAVENOUS
  Filled 2012-11-18: qty 2

## 2012-11-18 MED ORDER — KETOROLAC TROMETHAMINE 30 MG/ML IJ SOLN
30.0000 mg | Freq: Once | INTRAMUSCULAR | Status: AC
  Administered 2012-11-18: 30 mg via INTRAVENOUS
  Filled 2012-11-18: qty 1

## 2012-11-18 NOTE — ED Notes (Signed)
Pt was ambulating and felt a pop in her neck. She was given Fentanyl 53mcg/zofran 4 mg given en route.

## 2012-11-18 NOTE — ED Notes (Signed)
Patient refused to change into a gown. Patient stated that she did not want to move.

## 2012-11-18 NOTE — ED Provider Notes (Signed)
CSN: [id number]     Arrival date & time 11/18/12  1917 History   First MD Initiated Contact with Patient 11/18/12 1927     Chief Complaint  Patient presents with  . Neck Pain   (Consider location/radiation/quality/duration/timing/severity/associated sxs/prior Treatment) HPI Comments: Patient is a 52 year old female with history of kidney cancer, fibromyalgia, POTS, GERD, hypertension who presents today with sudden onset of neck pain. She reports she was simply walking out her door when all of a sudden she had an excruciating, sharp pain in her "hump". The pain was so bad it brought her to her knees. There was no trauma to her neck. She denies any radiation upward down her spine, into her arms, into her legs. She has no numbness, weakness, paresthesias. She reports only pain "her hump". She denies any recent illness, fever, cough, nausea, vomiting, abdominal pain. She has never had pain like this in the past. The pain is worse with movement and palpation. The pain is better when she lays flat in the bed. Currently her pain is improved with her knees bent in the bed, but she states there is no change in her pain with any movement of her legs. Her pain also improved after receiving fentanyl given by EMS.   The history is provided by the patient. No language interpreter was used.    Past Medical History  Diagnosis Date  . History of kidney cancer   . Fibromyalgia   . POTS (postural orthostatic tachycardia syndrome)   . GERD (gastroesophageal reflux disease)   . Hypertension    Past Surgical History  Procedure Laterality Date  . Kidney surgery    . Cesarean section    . Abdominal hysterectomy     No family history on file. History  Substance Use Topics  . Smoking status: Never Smoker   . Smokeless tobacco: Not on file  . Alcohol Use: No   OB History   Grav Para Term Preterm Abortions TAB SAB Ect Mult Living                 Review of Systems  Constitutional: Negative for fever and  chills.  HENT: Positive for neck pain.   Respiratory: Negative for shortness of breath.   Cardiovascular: Negative for chest pain.  Gastrointestinal: Negative for nausea, vomiting and abdominal pain.  Neurological: Negative for dizziness, light-headedness and headaches.  All other systems reviewed and are negative.    Allergies  Morphine  Home Medications   Current Outpatient Rx  Name  Route  Sig  Dispense  Refill  . amphetamine-dextroamphetamine (ADDERALL XR) 15 MG 24 hr capsule   Oral   Take 15 mg by mouth every morning.         . B Complex Vitamins (VITAMIN B COMPLEX IJ)   Injection   Inject 1 mL as directed 4 (four) times a week.         . DULoxetine (CYMBALTA) 60 MG capsule   Oral   Take 60 mg by mouth daily.         Marland Kitchen eletriptan (RELPAX) 40 MG tablet   Oral   One tablet by mouth at onset of headache. May repeat in 2 hours if headache persists or recurs. may repeat in 2 hours if necessary         . levothyroxine (SYNTHROID, LEVOTHROID) 75 MCG tablet   Oral   Take 75 mcg by mouth daily.         . methocarbamol (ROBAXIN) 500 MG tablet  Oral   Take 500 mg by mouth 4 (four) times daily. Patient uses this medication for pain.  She also states that she takes this medication prn.         . omeprazole (PRILOSEC) 40 MG capsule   Oral   Take 40 mg by mouth daily.         . pentosan polysulfate (ELMIRON) 100 MG capsule   Oral   Take 100 mg by mouth 3 (three) times daily before meals.         . topiramate (TOPAMAX) 100 MG tablet   Oral   Take 100 mg by mouth 2 (two) times daily.          BP 129/88  Pulse 96  Temp(Src) 98.7 F (37.1 C) (Oral)  Resp 16  SpO2 96% Physical Exam  Nursing note and vitals reviewed. Constitutional: She is oriented to person, place, and time. She appears well-developed and well-nourished. No distress.  HENT:  Head: Normocephalic and atraumatic.  Right Ear: External ear normal.  Left Ear: External ear normal.   Nose: Nose normal.  Mouth/Throat: Oropharynx is clear and moist.  Eyes: Conjunctivae and EOM are normal. Pupils are equal, round, and reactive to light.  Neck: Trachea normal, normal range of motion and phonation normal. No Brudzinski's sign and no Kernig's sign noted.  Tender to palpation over C7. No pain with knee flexion, straight leg raise bilaterally.   Cardiovascular: Normal rate, regular rhythm and normal heart sounds.   Pulmonary/Chest: Effort normal and breath sounds normal. No stridor. No respiratory distress. She has no wheezes. She has no rales.  Abdominal: Soft. She exhibits no distension.  Musculoskeletal: Normal range of motion.  Neurological: She is alert and oriented to person, place, and time. She has normal strength. No sensory deficit. She exhibits normal muscle tone. Coordination normal.  Skin: Skin is warm and dry. She is not diaphoretic. No erythema.  Psychiatric: She has a normal mood and affect. Her behavior is normal.    ED Course  Procedures (including critical care time) Labs Review Labs Reviewed - No data to display Imaging Review Ct Cervical Spine Wo Contrast  11/18/2012   CLINICAL DATA:  Low grade fever and neck pain.  EXAM: CT CERVICAL SPINE WITHOUT CONTRAST  TECHNIQUE: Multidetector CT imaging of the cervical spine was performed without intravenous contrast. Multiplanar CT image reconstructions were also generated.  COMPARISON:  None.  FINDINGS: Reversal of the normal cervical lordosis center around C5-C6. There is no cervical spine fracture. Atlantodental degenerative disease is present. Craniocervical junction appears normal. Shallow disc osteophyte complex is present at C5-C6 which produces a very mild central stenosis. Paraspinal soft tissues are normal. Lung apices appear within normal limits.  IMPRESSION: No acute abnormality. Mild cervical spondylosis with shallow disc osteophyte complex at C5-C6 and very mild central stenosis.   Electronically Signed    By: Andreas Newport M.D.   On: 11/18/2012 20:27    MDM   1. Neck pain    Patient presents with neck pain. Pain is isolated to C7. CT of cervical spine show no acute abnormality. Vital signs are all within normal limits. Initially in triage patient was mildly tachycardic. This is likely due to pain. Her pain was controlled in the emergency department with fentanyl and Toradol. Upon discharge her her rate was 96. No meningeal signs. Patient denies any sort of headache. No radiation of her neck pain. No numbness, weakness, paresthesias in any extremity. Strength 5 out of 5 in all  extremities. Strict return instructions given. She will follow up with her PCP. Vital sign stable for discharge. Discussed case with Dr. Fonnie Jarvis who agrees with plan. Patient / Family / Caregiver informed of clinical course, understand medical decision-making process, and agree with plan.     Mora Bellman, PA-C 11/18/12 2314

## 2012-11-18 NOTE — ED Notes (Signed)
Bed: WA02 Expected date:  Expected time:  Means of arrival:  Comments: Neck pain (fentanyl given)

## 2012-11-22 NOTE — ED Provider Notes (Signed)
Medical screening examination/treatment/procedure(s) were performed by non-physician practitioner and as supervising physician I was immediately available for consultation/collaboration.  Cambri Plourde M Shereese Bonnie, MD 11/22/12 2105 

## 2013-01-17 ENCOUNTER — Other Ambulatory Visit: Payer: Self-pay | Admitting: Obstetrics and Gynecology

## 2013-01-17 DIAGNOSIS — C649 Malignant neoplasm of unspecified kidney, except renal pelvis: Secondary | ICD-10-CM

## 2013-01-21 ENCOUNTER — Inpatient Hospital Stay
Admission: RE | Admit: 2013-01-21 | Discharge: 2013-01-21 | Disposition: A | Discharge status: Home / Self Care | Payer: 59 | Source: Ambulatory Visit | Attending: Obstetrics and Gynecology | Admitting: Obstetrics and Gynecology

## 2013-04-10 ENCOUNTER — Ambulatory Visit (INDEPENDENT_AMBULATORY_CARE_PROVIDER_SITE_OTHER): Payer: PRIVATE HEALTH INSURANCE | Admitting: General Surgery

## 2013-04-10 ENCOUNTER — Encounter (INDEPENDENT_AMBULATORY_CARE_PROVIDER_SITE_OTHER): Payer: Self-pay | Admitting: General Surgery

## 2013-04-10 VITALS — BP 124/88 | HR 88 | Temp 98.0°F | Resp 18 | Ht 65.0 in

## 2013-04-10 DIAGNOSIS — S6990XA Unspecified injury of unspecified wrist, hand and finger(s), initial encounter: Secondary | ICD-10-CM

## 2013-04-10 DIAGNOSIS — S6980XA Other specified injuries of unspecified wrist, hand and finger(s), initial encounter: Secondary | ICD-10-CM

## 2013-04-10 NOTE — Patient Instructions (Signed)
Dr. Lucie Leather office will call you regarding the timing of your appointment on Friday

## 2013-04-10 NOTE — Progress Notes (Signed)
Patient ID: Angel Sellers, female   DOB: 17-Mar-1960, 53 y.o.   MRN: [id number]  No chief complaint on file.   HPI Angel Sellers is a 53 y.o. female.  Chief complaint: Right thumbnail abnormality HPI Angel Sellers is the daughter of one of my long-term patients, Angel Sellers. Don noticed a line, dark in color, appear on her right thumbnail several weeks ago. She does not associate any trauma. She has no pain in the area. When she noticed it, it extended the length of her nail. She denies pain along the nail bed or elsewhere. She does have fibromyalgia and has significant pain in her hands at the baseline. Her mother is a melanoma patient and Angel Sellers is very concerned that this might represent a subungual melanoma. Past Medical History  Diagnosis Date  . History of kidney cancer   . Fibromyalgia   . POTS (postural orthostatic tachycardia syndrome)   . GERD (gastroesophageal reflux disease)   . Hypertension   . Arthritis   . Cancer   . Thyroid disease     Past Surgical History  Procedure Laterality Date  . Kidney surgery    . Cesarean section    . Abdominal hysterectomy      Family History  Problem Relation Age of Onset  . Melanoma Mother     Social History History  Substance Use Topics  . Smoking status: Never Smoker   . Smokeless tobacco: Not on file  . Alcohol Use: No    Allergies  Allergen Reactions  . Morphine Hives and Rash    Current Outpatient Prescriptions  Medication Sig Dispense Refill  . amphetamine-dextroamphetamine (ADDERALL) 10 MG tablet Take 10 mg by mouth daily with breakfast.      . DULoxetine (CYMBALTA) 60 MG capsule Take 60 mg by mouth daily.      Marland Kitchen eletriptan (RELPAX) 40 MG tablet One tablet by mouth at onset of headache. May repeat in 2 hours if headache persists or recurs. may repeat in 2 hours if necessary      . levothyroxine (SYNTHROID, LEVOTHROID) 100 MCG tablet Take 100 mcg by mouth daily before breakfast.      . methocarbamol (ROBAXIN) 500 MG tablet  Take 500 mg by mouth 4 (four) times daily. Patient uses this medication for pain.  She also states that she takes this medication prn.      . omeprazole (PRILOSEC) 40 MG capsule Take 40 mg by mouth daily.      Marland Kitchen OVER THE COUNTER MEDICATION       . tapentadol (NUCYNTA) 50 MG TABS tablet Take 50 mg by mouth daily as needed (as needed for flares).       No current facility-administered medications for this visit.    Review of Systems Review of Systems  Constitutional: Negative.   HENT: Negative.   Eyes: Negative.   Respiratory: Negative.   Cardiovascular: Negative.   Gastrointestinal: Negative.   Endocrine: Positive for cold intolerance.  Genitourinary:       Kidney cancer  Musculoskeletal:       Severe fibromyalgia with limitations to gait and muscular function  Allergic/Immunologic: Negative.   Neurological: Negative.   Hematological: Negative.   Psychiatric/Behavioral: Negative.     Blood pressure 124/88, pulse 88, temperature 98 F (36.7 C), resp. rate 18, height 5\' 5"  (1.651 m).  Physical Exam Physical Exam  Constitutional: She is oriented to person, place, and time. She appears well-developed and well-nourished. No distress.  HENT:  Head: Normocephalic and atraumatic.  Mouth/Throat: Oropharynx is clear and moist. No oropharyngeal exudate.  Eyes: EOM are normal. Pupils are equal, round, and reactive to light. No scleral icterus.  Neck: Normal range of motion. Neck supple. No tracheal deviation present.  Cardiovascular: Normal rate and normal heart sounds.   Pulmonary/Chest: Effort normal and breath sounds normal. No stridor. No respiratory distress. She has no wheezes. She has no rales. She exhibits no tenderness.  Abdominal: Soft. Bowel sounds are normal. She exhibits no distension. There is no tenderness. There is no rebound and no guarding.  Musculoskeletal:       Arms: Right thumb with dark brown to black linear line down the center, extends from the nailbed out to  tip, no swelling or mass at the nail bed, no visible growth beneath the nail, good range of motion and light touch sensation. Generalized tenderness to both hands is chronic due to her fibromyalgia according to her.  Neurological: She is alert and oriented to person, place, and time.  Gait limited by pain  Skin: Skin is warm.  Psychiatric: She has a normal mood and affect.  Lymphatic: No palpable bilateral axillary, bilateral cervical, nor bilateral supraclavicular adenopathy   Assessment & Plan    Linear right thumbnail abnormality. No obvious mass. Does not appear like a classic subungual melanoma, however, I will ask Dr. Daryll Brod from hand surgery to evaluate. I have arranged for an appointment this Friday. Followup plans will be pending his evaluation.          Angel Sellers E 04/10/2013, 12:36 PM

## 2013-04-12 ENCOUNTER — Other Ambulatory Visit: Payer: Self-pay | Admitting: Orthopedic Surgery

## 2013-04-19 ENCOUNTER — Other Ambulatory Visit: Payer: Self-pay | Admitting: Orthopedic Surgery

## 2013-04-26 ENCOUNTER — Encounter (HOSPITAL_BASED_OUTPATIENT_CLINIC_OR_DEPARTMENT_OTHER): Payer: Self-pay | Admitting: *Deleted

## 2013-04-26 NOTE — Progress Notes (Signed)
No labs needed

## 2013-05-01 ENCOUNTER — Ambulatory Visit (HOSPITAL_BASED_OUTPATIENT_CLINIC_OR_DEPARTMENT_OTHER)
Admission: RE | Admit: 2013-05-01 | Discharge: 2013-05-01 | Disposition: A | Discharge status: Home / Self Care | Payer: 59 | Source: Ambulatory Visit | Attending: Orthopedic Surgery | Admitting: Orthopedic Surgery

## 2013-05-01 ENCOUNTER — Ambulatory Visit (HOSPITAL_BASED_OUTPATIENT_CLINIC_OR_DEPARTMENT_OTHER): Payer: 59 | Admitting: Certified Registered"

## 2013-05-01 ENCOUNTER — Encounter (HOSPITAL_BASED_OUTPATIENT_CLINIC_OR_DEPARTMENT_OTHER): Payer: Self-pay | Admitting: Certified Registered"

## 2013-05-01 ENCOUNTER — Encounter (HOSPITAL_BASED_OUTPATIENT_CLINIC_OR_DEPARTMENT_OTHER)
Admission: RE | Disposition: A | Discharge status: Home / Self Care | Payer: Self-pay | Source: Ambulatory Visit | Attending: Orthopedic Surgery

## 2013-05-01 ENCOUNTER — Encounter (HOSPITAL_BASED_OUTPATIENT_CLINIC_OR_DEPARTMENT_OTHER): Payer: 59 | Admitting: Certified Registered"

## 2013-05-01 DIAGNOSIS — F3289 Other specified depressive episodes: Secondary | ICD-10-CM | POA: Insufficient documentation

## 2013-05-01 DIAGNOSIS — IMO0001 Reserved for inherently not codable concepts without codable children: Secondary | ICD-10-CM | POA: Insufficient documentation

## 2013-05-01 DIAGNOSIS — I498 Other specified cardiac arrhythmias: Secondary | ICD-10-CM | POA: Insufficient documentation

## 2013-05-01 DIAGNOSIS — F329 Major depressive disorder, single episode, unspecified: Secondary | ICD-10-CM | POA: Insufficient documentation

## 2013-05-01 DIAGNOSIS — I10 Essential (primary) hypertension: Secondary | ICD-10-CM | POA: Insufficient documentation

## 2013-05-01 DIAGNOSIS — E079 Disorder of thyroid, unspecified: Secondary | ICD-10-CM | POA: Insufficient documentation

## 2013-05-01 DIAGNOSIS — S6990XA Unspecified injury of unspecified wrist, hand and finger(s), initial encounter: Secondary | ICD-10-CM

## 2013-05-01 DIAGNOSIS — Z8553 Personal history of malignant neoplasm of renal pelvis: Secondary | ICD-10-CM | POA: Insufficient documentation

## 2013-05-01 DIAGNOSIS — Z808 Family history of malignant neoplasm of other organs or systems: Secondary | ICD-10-CM | POA: Insufficient documentation

## 2013-05-01 DIAGNOSIS — K219 Gastro-esophageal reflux disease without esophagitis: Secondary | ICD-10-CM | POA: Insufficient documentation

## 2013-05-01 DIAGNOSIS — L819 Disorder of pigmentation, unspecified: Secondary | ICD-10-CM | POA: Insufficient documentation

## 2013-05-01 HISTORY — DX: Interstitial cystitis (chronic) without hematuria: N30.10

## 2013-05-01 HISTORY — PX: BONE BIOPSY: SHX375

## 2013-05-01 HISTORY — DX: Other specified postprocedural states: Z98.890

## 2013-05-01 HISTORY — DX: Nausea with vomiting, unspecified: R11.2

## 2013-05-01 LAB — POCT HEMOGLOBIN-HEMACUE: HEMOGLOBIN: 13.9 g/dL (ref 12.0–15.0)

## 2013-05-01 SURGERY — BIOPSY, BONE
Anesthesia: General | Site: Finger | Laterality: Right

## 2013-05-01 MED ORDER — LIDOCAINE HCL (CARDIAC) 20 MG/ML IV SOLN
INTRAVENOUS | Status: DC | PRN
  Administered 2013-05-01: 50 mg via INTRAVENOUS

## 2013-05-01 MED ORDER — SCOPOLAMINE 1 MG/3DAYS TD PT72
MEDICATED_PATCH | TRANSDERMAL | Status: DC | PRN
  Administered 2013-05-01: 1 via TRANSDERMAL

## 2013-05-01 MED ORDER — CHLORHEXIDINE GLUCONATE 4 % EX LIQD: 60.0000 mL | Freq: Once | CUTANEOUS | Status: DC

## 2013-05-01 MED ORDER — BUPIVACAINE HCL (PF) 0.25 % IJ SOLN
INTRAMUSCULAR | Status: DC | PRN
Start: 2013-05-01 — End: 2013-05-01
  Administered 2013-05-01: 10 mL

## 2013-05-01 MED ORDER — FENTANYL CITRATE 0.05 MG/ML IJ SOLN
INTRAMUSCULAR | Status: AC
  Filled 2013-05-01: qty 2

## 2013-05-01 MED ORDER — MEPERIDINE HCL 25 MG/ML IJ SOLN: 6.2500 mg | INTRAMUSCULAR | Status: DC | PRN

## 2013-05-01 MED ORDER — CEFAZOLIN SODIUM-DEXTROSE 2-3 GM-% IV SOLR
INTRAVENOUS | Status: AC
  Filled 2013-05-01: qty 50

## 2013-05-01 MED ORDER — FENTANYL CITRATE 0.05 MG/ML IJ SOLN
INTRAMUSCULAR | Status: AC
  Filled 2013-05-01: qty 6

## 2013-05-01 MED ORDER — MIDAZOLAM HCL 5 MG/5ML IJ SOLN
INTRAMUSCULAR | Status: DC | PRN
  Administered 2013-05-01: 2 mg via INTRAVENOUS

## 2013-05-01 MED ORDER — CEFAZOLIN SODIUM-DEXTROSE 2-3 GM-% IV SOLR
2.0000 g | INTRAVENOUS | Status: AC
Start: 2013-05-01 — End: 2013-05-01
  Administered 2013-05-01: 2 g via INTRAVENOUS

## 2013-05-01 MED ORDER — HYDROMORPHONE HCL PF 1 MG/ML IJ SOLN: 0.2500 mg | INTRAMUSCULAR | Status: DC | PRN

## 2013-05-01 MED ORDER — CHLORHEXIDINE GLUCONATE 4 % EX LIQD
60.0000 mL | Freq: Once | CUTANEOUS | Status: DC
Start: 2013-05-01 — End: 2013-05-01

## 2013-05-01 MED ORDER — DEXAMETHASONE SODIUM PHOSPHATE 10 MG/ML IJ SOLN
INTRAMUSCULAR | Status: DC | PRN
  Administered 2013-05-01: 10 mg via INTRAVENOUS

## 2013-05-01 MED ORDER — OXYCODONE HCL 5 MG PO TABS: 5.0000 mg | ORAL_TABLET | Freq: Once | ORAL | Status: DC | PRN

## 2013-05-01 MED ORDER — BUPIVACAINE HCL (PF) 0.25 % IJ SOLN
INTRAMUSCULAR | Status: AC
  Filled 2013-05-01: qty 60

## 2013-05-01 MED ORDER — OXYCODONE HCL 5 MG/5ML PO SOLN: 5.0000 mg | Freq: Once | ORAL | Status: DC | PRN

## 2013-05-01 MED ORDER — PROPOFOL 10 MG/ML IV BOLUS
INTRAVENOUS | Status: DC | PRN
  Administered 2013-05-01: 150 mg via INTRAVENOUS

## 2013-05-01 MED ORDER — PROPOFOL 10 MG/ML IV EMUL
INTRAVENOUS | Status: AC
  Filled 2013-05-01: qty 100

## 2013-05-01 MED ORDER — FENTANYL CITRATE 0.05 MG/ML IJ SOLN
100.0000 ug | Freq: Once | INTRAMUSCULAR | Status: AC
  Administered 2013-05-01: 50 ug via INTRAVENOUS

## 2013-05-01 MED ORDER — MIDAZOLAM HCL 2 MG/2ML IJ SOLN: 1.0000 mg | INTRAMUSCULAR | Status: DC | PRN

## 2013-05-01 MED ORDER — FENTANYL CITRATE 0.05 MG/ML IJ SOLN
INTRAMUSCULAR | Status: DC | PRN
  Administered 2013-05-01: 100 ug via INTRAVENOUS

## 2013-05-01 MED ORDER — MIDAZOLAM HCL 2 MG/2ML IJ SOLN
INTRAMUSCULAR | Status: AC
  Filled 2013-05-01: qty 2

## 2013-05-01 MED ORDER — SCOPOLAMINE 1 MG/3DAYS TD PT72
MEDICATED_PATCH | TRANSDERMAL | Status: AC
  Filled 2013-05-01: qty 1

## 2013-05-01 MED ORDER — CEFAZOLIN SODIUM-DEXTROSE 2-3 GM-% IV SOLR: 2.0000 g | INTRAVENOUS | Status: DC

## 2013-05-01 MED ORDER — ONDANSETRON HCL 4 MG/2ML IJ SOLN: 4.0000 mg | Freq: Once | INTRAMUSCULAR | Status: DC | PRN

## 2013-05-01 MED ORDER — LACTATED RINGERS IV SOLN
INTRAVENOUS | Status: DC
  Administered 2013-05-01 (×2): via INTRAVENOUS

## 2013-05-01 MED ORDER — ONDANSETRON HCL 4 MG/2ML IJ SOLN
INTRAMUSCULAR | Status: DC | PRN
  Administered 2013-05-01: 4 mg via INTRAVENOUS

## 2013-05-01 MED ORDER — FENTANYL CITRATE 0.05 MG/ML IJ SOLN
50.0000 ug | INTRAMUSCULAR | Status: DC | PRN
  Administered 2013-05-01 (×2): 25 ug via INTRAVENOUS

## 2013-05-01 SURGICAL SUPPLY — 50 items
BLADE MINI RND TIP GREEN BEAV (BLADE) ×4 IMPLANT
BLADE SURG 15 STRL LF DISP TIS (BLADE) ×1 IMPLANT
BLADE SURG 15 STRL SS (BLADE) ×1
BNDG COHESIVE 3X5 TAN STRL LF (GAUZE/BANDAGES/DRESSINGS) ×2 IMPLANT
BNDG ESMARK 4X9 LF (GAUZE/BANDAGES/DRESSINGS) ×2 IMPLANT
BNDG GAUZE ELAST 4 BULKY (GAUZE/BANDAGES/DRESSINGS) IMPLANT
CHLORAPREP W/TINT 26ML (MISCELLANEOUS) ×4 IMPLANT
CORDS BIPOLAR (ELECTRODE) ×2 IMPLANT
COVER MAYO STAND STRL (DRAPES) ×4 IMPLANT
COVER TABLE BACK 60X90 (DRAPES) ×2 IMPLANT
CUFF TOURNIQUET SINGLE 18IN (TOURNIQUET CUFF) ×2 IMPLANT
DECANTER SPIKE VIAL GLASS SM (MISCELLANEOUS) IMPLANT
DRAPE EXTREMITY T 121X128X90 (DRAPE) ×4 IMPLANT
DRAPE OEC MINIVIEW 54X84 (DRAPES) IMPLANT
DRAPE SURG 17X23 STRL (DRAPES) ×2 IMPLANT
DRSG KUZMA FLUFF (GAUZE/BANDAGES/DRESSINGS) ×2 IMPLANT
GAUZE XEROFORM 1X8 LF (GAUZE/BANDAGES/DRESSINGS) ×2 IMPLANT
GLOVE BIOGEL PI IND STRL 7.0 (GLOVE) ×2 IMPLANT
GLOVE BIOGEL PI IND STRL 8.5 (GLOVE) ×1 IMPLANT
GLOVE BIOGEL PI INDICATOR 7.0 (GLOVE) ×2
GLOVE BIOGEL PI INDICATOR 8.5 (GLOVE) ×1
GLOVE ECLIPSE 6.5 STRL STRAW (GLOVE) ×4 IMPLANT
GLOVE SURG ORTHO 8.0 STRL STRW (GLOVE) ×2 IMPLANT
GOWN STRL REUS W/ TWL LRG LVL3 (GOWN DISPOSABLE) ×1 IMPLANT
GOWN STRL REUS W/TWL LRG LVL3 (GOWN DISPOSABLE) ×1
GOWN STRL REUS W/TWL XL LVL3 (GOWN DISPOSABLE) ×2 IMPLANT
NS IRRIG 1000ML POUR BTL (IV SOLUTION) ×2 IMPLANT
PACK BASIN DAY SURGERY FS (CUSTOM PROCEDURE TRAY) ×2 IMPLANT
PAD CAST 3X4 CTTN HI CHSV (CAST SUPPLIES) IMPLANT
PADDING CAST ABS 3INX4YD NS (CAST SUPPLIES)
PADDING CAST ABS 4INX4YD NS (CAST SUPPLIES)
PADDING CAST ABS COTTON 3X4 (CAST SUPPLIES) IMPLANT
PADDING CAST ABS COTTON 4X4 ST (CAST SUPPLIES) IMPLANT
PADDING CAST COTTON 3X4 STRL (CAST SUPPLIES)
SPLINT PLASTER CAST XFAST 3X15 (CAST SUPPLIES) IMPLANT
SPLINT PLASTER XTRA FASTSET 3X (CAST SUPPLIES)
SPONGE GAUZE 4X4 12PLY (GAUZE/BANDAGES/DRESSINGS) ×2 IMPLANT
STOCKINETTE 4X48 STRL (DRAPES) ×2 IMPLANT
SUT CHROMIC 5 0 P 3 (SUTURE) ×4 IMPLANT
SUT VIC AB 0 CT1 27 (SUTURE)
SUT VIC AB 0 CT1 27XBRD ANBCTR (SUTURE) IMPLANT
SUT VIC AB 2-0 SH 27 (SUTURE)
SUT VIC AB 2-0 SH 27XBRD (SUTURE) IMPLANT
SUT VIC AB 4-0 P2 18 (SUTURE) IMPLANT
SUT VICRYL 4-0 PS2 18IN ABS (SUTURE) IMPLANT
SUT VICRYL RAPID 5 0 P 3 (SUTURE) IMPLANT
SUT VICRYL RAPIDE 4/0 PS 2 (SUTURE) ×2 IMPLANT
SYR CONTROL 10ML LL (SYRINGE) IMPLANT
TOWEL OR 17X24 6PK STRL BLUE (TOWEL DISPOSABLE) ×2 IMPLANT
UNDERPAD 30X30 INCONTINENT (UNDERPADS AND DIAPERS) ×2 IMPLANT

## 2013-05-01 NOTE — Transfer of Care (Signed)
Immediate Anesthesia Transfer of Care Note  Patient: Angel Sellers  Procedure(s) Performed: Procedure(s): BIOPSY NAIL BED RIGHT THUMB, BIOPSY RIGHT GREAT TOE  (Right)  Patient Location: PACU  Anesthesia Type:General  Level of Consciousness: awake and patient cooperative  Airway & Oxygen Therapy: Patient Spontanous Breathing and Patient connected to face mask oxygen  Post-op Assessment: Report given to PACU RN and Post -op Vital signs reviewed and stable  Post vital signs: Reviewed and stable  Complications: No apparent anesthesia complications

## 2013-05-01 NOTE — Anesthesia Procedure Notes (Signed)
Procedure Name: LMA Insertion Date/Time: 05/01/2013 8:40 AM Performed by: Arlow Spiers Pre-anesthesia Checklist: Patient identified, Emergency Drugs available, Suction available and Patient being monitored Patient Re-evaluated:Patient Re-evaluated prior to inductionOxygen Delivery Method: Circle System Utilized Preoxygenation: Pre-oxygenation with 100% oxygen Intubation Type: IV induction Ventilation: Mask ventilation without difficulty LMA: LMA inserted LMA Size: 4.0 Number of attempts: 1 Airway Equipment and Method: bite block Placement Confirmation: positive ETCO2 Tube secured with: Tape Dental Injury: Teeth and Oropharynx as per pre-operative assessment

## 2013-05-01 NOTE — Brief Op Note (Signed)
05/01/2013  9:24 AM  PATIENT:  Angel Sellers  53 y.o. female  PRE-OPERATIVE DIAGNOSIS:  PIGMENTED LESION RIGHT THUMB  POST-OPERATIVE DIAGNOSIS:  PIGMENTED LESION RIGHTTHUMB  PROCEDURE:  Procedure(s): BIOPSY NAIL BED RIGHT THUMB, BIOPSY RIGHT GREAT TOE  (Right)  SURGEON:  Surgeon(s) and Role:    * Wynonia Sours, MD - Primary  PHYSICIAN ASSISTANT:   ASSISTANTS: none   ANESTHESIA:   local and general  EBL:  Total I/O In: 1000 [I.V.:1000] Out: -   BLOOD ADMINISTERED:none  DRAINS: none   LOCAL MEDICATIONS USED:  BUPIVICAINE   SPECIMEN:  Excision  DISPOSITION OF SPECIMEN:  PATHOLOGY  COUNTS:  YES  TOURNIQUET:   Total Tourniquet Time Documented: Upper Arm (Right) - 9 minutes Upper Arm (Right) - 9 minutes Total: Upper Arm (Right) - 18 minutes   DICTATION: .Other Dictation: Dictation Number [id number]  PLAN OF CARE: Discharge to home after PACU  PATIENT DISPOSITION:  PACU - hemodynamically stable.

## 2013-05-01 NOTE — H&P (Signed)
Angel Sellers is a 53 year old right hand dominant female referred by Dr. Georganna Skeans for a consultation with respect to a melanotic streak in her right thumb nail. This has been present for approximately 6 weeks. She is not complaining of pain or discomfort. She has a strong family history of melanoma in her mother. She has a history of a renal carcinoma with partial kidney resection. She has no history of injury to her hand. She has a history of high BP, migraines, GERD, irritable colon, myalgia and chronic fatigue. She has no history of diabetes. She is not complaining of any pain or discomfort but is very concerned about the possibility of this being a melanoma. She has noted one on herright  great toe and would like it to be biopsied also  PAST MEDICAL HISTORY: She is allergic to Morphine. She is on Levothyroxine, Robaxin, Cymbalta, Adderall, omeprazole, Cepta Q, RetroX and Phenergan. She has had C-sections, partial robotic nephrectomy, and abdominal hysterectomy.   FAMILY H ISTORY: Positive for heart disease and high BP.  SOCIAL HISTORY: She does not smoke or drink. She is married.  REVIEW OF SYSTEMS: Positive for renal cell carcinoma, glasses, ringing in her ears, high BP, migraine headaches, and depression otherwise negative. Angel Sellers is an 53 y.o. female.   Chief Complaint: melanotic streaks rt thumb rt great toe HPI: see above  Past Medical History  Diagnosis Date  . History of kidney cancer   . Fibromyalgia   . POTS (postural orthostatic tachycardia syndrome)   . GERD (gastroesophageal reflux disease)   . Hypertension   . Arthritis   . Thyroid disease   . PONV (postoperative nausea and vomiting)     need scope patch  . Interstitial cystitis   . Cancer     renal  . Depression     Past Surgical History  Procedure Laterality Date  . Kidney surgery  2011    rt partial nephrectomy-[facility]  . Cesarean section    . Abdominal hysterectomy  1999  . Tonsillectomy    .  Colonoscopy    . Cystoscopy      Family History  Problem Relation Age of Onset  . Melanoma Mother    Social History:  reports that she has never smoked. She does not have any smokeless tobacco history on file. She reports that she does not drink alcohol. Her drug history is not on file.  Allergies:  Allergies  Allergen Reactions  . Morphine Hives and Rash    Medications Prior to Admission  Medication Sig Dispense Refill  . amphetamine-dextroamphetamine (ADDERALL) 10 MG tablet Take 10 mg by mouth daily with breakfast.      . DULoxetine (CYMBALTA) 60 MG capsule Take 60 mg by mouth daily.      Marland Kitchen eletriptan (RELPAX) 40 MG tablet One tablet by mouth at onset of headache. May repeat in 2 hours if headache persists or recurs. may repeat in 2 hours if necessary      . levothyroxine (SYNTHROID, LEVOTHROID) 100 MCG tablet Take 100 mcg by mouth daily before breakfast.      . methocarbamol (ROBAXIN) 500 MG tablet Take 500 mg by mouth 4 (four) times daily. Patient uses this medication for pain.  She also states that she takes this medication prn.      . omeprazole (PRILOSEC) 40 MG capsule Take 40 mg by mouth daily.      . tapentadol (NUCYNTA) 50 MG TABS tablet Take 50 mg by mouth daily as  needed (as needed for flares).      Marland Kitchen OVER THE COUNTER MEDICATION [id number]        No results found for this or any previous visit (from the past 48 hour(s)).  No results found.   Pertinent items are noted in HPI.  Height 5\' 5"  (1.651 m), weight 190 lb (86.183 kg).  General appearance: alert, cooperative and appears stated age Head: Normocephalic, without obvious abnormality Neck: no JVD Resp: clear to auscultation bilaterally Cardio: regular rate and rhythm, S1, S2 normal, no murmur, click, rub or gallop GI: soft, non-tender; bowel sounds normal; no masses,  no organomegaly Extremities: melanotic streaks rt thumb and great toe Pulses: 2+ and symmetric Skin: Skin color, texture, turgor normal. No  rashes or lesions Neurologic: Alert and oriented X 3, normal strength and tone. Normal symmetric reflexes. Normal coordination and gait Incisnaion/Wound: na  Assessment/Plan Diagnosis: Melanotic streak  We would recommend biopsy of this with her especially with family history of melanoma. She is in agreement with this. This will be scheduled as an outpatient under regional anesthesia. She is aware of the potential for nail abnormalities following this, possibility of further intervention should this be positive for melanoma.  Ranay Ketter R 05/01/2013, 7:42 AM

## 2013-05-01 NOTE — Anesthesia Postprocedure Evaluation (Signed)
Anesthesia Post Note  Patient: Angel Sellers  Procedure(s) Performed: Procedure(s) (LRB): BIOPSY NAIL BED RIGHT THUMB, BIOPSY RIGHT GREAT TOE  (Right)  Anesthesia type: general  Patient location: PACU  Post pain: Pain level controlled  Post assessment: Patient's Cardiovascular Status Stable  Last Vitals:  Filed Vitals:   05/01/13 1100  BP: 142/88  Pulse:   Temp: 36.8 C  Resp: 16    Post vital signs: Reviewed and stable  Level of consciousness: sedated  Complications: No apparent anesthesia complications

## 2013-05-01 NOTE — Op Note (Signed)
Other Dictation: Dictation Number [id number]

## 2013-05-01 NOTE — Anesthesia Preprocedure Evaluation (Addendum)
Anesthesia Evaluation  Patient identified by MRN, date of birth, ID band Patient awake    Reviewed: Allergy & Precautions, H&P , NPO status   History of Anesthesia Complications (+) PONV  Airway Mallampati: I TM Distance: >3 FB Neck ROM: Full    Dental   Pulmonary          Cardiovascular hypertension, Pt. on medications     Neuro/Psych  Headaches, PSYCHIATRIC DISORDERS Depression  Neuromuscular disease    GI/Hepatic GERD-  Medicated and Controlled,  Endo/Other    Renal/GU      Musculoskeletal  (+) Fibromyalgia -  Abdominal   Peds  Hematology   Anesthesia Other Findings   Reproductive/Obstetrics                         Anesthesia Physical Anesthesia Plan  ASA: II  Anesthesia Plan: General   Post-op Pain Management:    Induction: Intravenous  Airway Management Planned: LMA  Additional Equipment:   Intra-op Plan:   Post-operative Plan: Extubation in OR  Informed Consent: I have reviewed the patients History and Physical, chart, labs and discussed the procedure including the risks, benefits and alternatives for the proposed anesthesia with the patient or authorized representative who has indicated his/her understanding and acceptance.     Plan Discussed with: CRNA and Surgeon  Anesthesia Plan Comments:         Anesthesia Quick Evaluation

## 2013-05-01 NOTE — Discharge Instructions (Addendum)

## 2013-05-02 ENCOUNTER — Encounter (HOSPITAL_BASED_OUTPATIENT_CLINIC_OR_DEPARTMENT_OTHER): Payer: Self-pay | Admitting: Orthopedic Surgery

## 2013-05-02 NOTE — Op Note (Signed)
Angel Sellers, Angel Sellers                ACCOUNT NO.:  [id number]  MEDICAL RECORD NO.:  [id number]  LOCATION:                                 FACILITY:  PHYSICIAN:  Daryll Brod, M.D.       DATE OF BIRTH:  1960-06-18  DATE OF PROCEDURE:  05/01/2013 DATE OF DISCHARGE:  11/18/2012                              OPERATIVE REPORT   PREOPERATIVE DIAGNOSIS:  Melanotic strip right thumb, right great toe.  POSTOPERATIVE DIAGNOSIS:  Melanotic strip right thumb, right great toe.  OPERATION:  Nail bed biopsy, right thumb; nail bed biopsy, right great toe.  SURGEON:  Daryll Brod, M.D.  ANESTHESIA:  General with metacarpal and metatarsal block right thumb, right great toe.  ANESTHESIOLOGIST:  Crissie Sickles. Conrad [place], M.D.  HISTORY:  The patient is a 53 year old female with a history of melanotic strips to the right thumb, right great toe.  She is very concerned with this and that she has a family history of melanoma.  She is desirous having these biopsied, be certain that these are not melanoma.  Pre, peri, and postoperative course has been discussed along with risks and complications.  She is aware that there is no guarantee with surgery, possibility of infection, recurrence of injury to arteries, nerves, tendons, incomplete relief of symptoms, dystrophy, the possibility of further surgery being necessary should these be positive. She is aware of nail deformities.  In the preoperative area, the patient is seen, the extremity marked by both the patient and surgeon. Antibiotic given.  PROCEDURE IN DETAIL:  The patient was brought to the operating room, where general anesthetic was carried out without difficulty.  She was prepped using ChloraPrep, supine position with the right arm, right leg free.  A 3 minute dry time was allowed each.  Time-out taken confirming the patient and procedure.  The arm was attended to first.  The limb exsanguinated with an Esmarch bandage.  Tourniquet placed on the upper arm  inflated to 250 mmHg.  A periosteal elevator was then used to elevate the nail plate off the nail bed.  The area of darkened strep was immediately apparent in the nail bed.  This was excised and elliptical incision undermined and closed after irrigation with interrupted 5-0 chromic sutures.  Specimen was sent to Pathology and nonadherent gauze was placed in the nail fold. A metacarpal block was given with 0.25% marcaine plain . 5cc was used. Sterile compressive dressing applied.  On deflation of the tourniquet, remaining fingers pinked.  The foot was attended to next after being prepped and draped.  This was exsanguinated with an Esmarch bandage.  Tourniquet placed on the upper arm and leg was inflated 300 mmHg.  The nail plate was removed with new instruments. The nail bed was then biopsied.  This did not have a darkened discoloration as was seen on the thumb.  The specimen was sent to Pathology.  The area undermined and closed with interrupted 5-0 chromic sutures.  Nonadherent gauze was  placed in the nail fold.  A sterile compressive dressing was applied. A metatarsal block was given with 0.25% marcaine. On deflation of the tourniquet, the remaining toes pinked.  She was  taken to the recovery room for observation in satisfactory condition. She will be discharged home to return to the [facility] in 1 week on Nucynta.          ______________________________ Daryll Brod, M.D.     GK/MEDQ  D:  05/01/2013  T:  05/01/2013  Job:  [id number]

## 2013-08-09 DIAGNOSIS — M542 Cervicalgia: Secondary | ICD-10-CM | POA: Insufficient documentation

## 2013-09-27 DIAGNOSIS — N62 Hypertrophy of breast: Secondary | ICD-10-CM | POA: Diagnosis not present

## 2013-10-03 DIAGNOSIS — D485 Neoplasm of uncertain behavior of skin: Secondary | ICD-10-CM | POA: Diagnosis not present

## 2013-10-03 DIAGNOSIS — D235 Other benign neoplasm of skin of trunk: Secondary | ICD-10-CM | POA: Diagnosis not present

## 2013-10-09 DIAGNOSIS — M76899 Other specified enthesopathies of unspecified lower limb, excluding foot: Secondary | ICD-10-CM | POA: Diagnosis not present

## 2013-10-19 DIAGNOSIS — M25559 Pain in unspecified hip: Secondary | ICD-10-CM | POA: Diagnosis not present

## 2013-10-23 DIAGNOSIS — M76899 Other specified enthesopathies of unspecified lower limb, excluding foot: Secondary | ICD-10-CM | POA: Diagnosis not present

## 2013-11-21 DIAGNOSIS — M797 Fibromyalgia: Secondary | ICD-10-CM | POA: Diagnosis not present

## 2013-11-21 DIAGNOSIS — G894 Chronic pain syndrome: Secondary | ICD-10-CM | POA: Diagnosis not present

## 2013-11-21 DIAGNOSIS — R5382 Chronic fatigue, unspecified: Secondary | ICD-10-CM | POA: Diagnosis not present

## 2013-11-21 DIAGNOSIS — Z79899 Other long term (current) drug therapy: Secondary | ICD-10-CM | POA: Diagnosis not present

## 2013-12-23 DIAGNOSIS — N390 Urinary tract infection, site not specified: Secondary | ICD-10-CM | POA: Diagnosis not present

## 2014-01-03 DIAGNOSIS — M797 Fibromyalgia: Secondary | ICD-10-CM | POA: Diagnosis not present

## 2014-01-03 DIAGNOSIS — E039 Hypothyroidism, unspecified: Secondary | ICD-10-CM | POA: Diagnosis not present

## 2014-01-03 DIAGNOSIS — I10 Essential (primary) hypertension: Secondary | ICD-10-CM | POA: Diagnosis not present

## 2014-01-15 DIAGNOSIS — R5381 Other malaise: Secondary | ICD-10-CM | POA: Diagnosis not present

## 2014-01-15 DIAGNOSIS — R000 Tachycardia, unspecified: Secondary | ICD-10-CM | POA: Diagnosis not present

## 2014-01-15 DIAGNOSIS — N3010 Interstitial cystitis (chronic) without hematuria: Secondary | ICD-10-CM | POA: Diagnosis not present

## 2014-01-15 DIAGNOSIS — R5382 Chronic fatigue, unspecified: Secondary | ICD-10-CM | POA: Diagnosis not present

## 2014-01-15 DIAGNOSIS — C641 Malignant neoplasm of right kidney, except renal pelvis: Secondary | ICD-10-CM | POA: Diagnosis not present

## 2014-01-15 DIAGNOSIS — E039 Hypothyroidism, unspecified: Secondary | ICD-10-CM | POA: Diagnosis not present

## 2014-01-15 DIAGNOSIS — I10 Essential (primary) hypertension: Secondary | ICD-10-CM | POA: Diagnosis not present

## 2014-01-15 DIAGNOSIS — I951 Orthostatic hypotension: Secondary | ICD-10-CM | POA: Diagnosis not present

## 2014-01-15 DIAGNOSIS — N62 Hypertrophy of breast: Secondary | ICD-10-CM | POA: Diagnosis not present

## 2014-01-15 DIAGNOSIS — M542 Cervicalgia: Secondary | ICD-10-CM | POA: Diagnosis not present

## 2014-01-15 DIAGNOSIS — M797 Fibromyalgia: Secondary | ICD-10-CM | POA: Diagnosis not present

## 2014-01-27 DIAGNOSIS — R110 Nausea: Secondary | ICD-10-CM | POA: Diagnosis not present

## 2014-01-27 DIAGNOSIS — N939 Abnormal uterine and vaginal bleeding, unspecified: Secondary | ICD-10-CM | POA: Diagnosis present

## 2014-01-27 DIAGNOSIS — N62 Hypertrophy of breast: Secondary | ICD-10-CM | POA: Diagnosis not present

## 2014-01-27 DIAGNOSIS — G8929 Other chronic pain: Secondary | ICD-10-CM | POA: Diagnosis present

## 2014-01-27 DIAGNOSIS — G8918 Other acute postprocedural pain: Secondary | ICD-10-CM | POA: Diagnosis not present

## 2014-01-27 DIAGNOSIS — N6032 Fibrosclerosis of left breast: Secondary | ICD-10-CM | POA: Diagnosis not present

## 2014-01-27 DIAGNOSIS — I10 Essential (primary) hypertension: Secondary | ICD-10-CM | POA: Diagnosis present

## 2014-01-27 DIAGNOSIS — Z6828 Body mass index (BMI) 28.0-28.9, adult: Secondary | ICD-10-CM | POA: Diagnosis not present

## 2014-01-27 DIAGNOSIS — N6031 Fibrosclerosis of right breast: Secondary | ICD-10-CM | POA: Diagnosis not present

## 2014-01-27 DIAGNOSIS — M797 Fibromyalgia: Secondary | ICD-10-CM | POA: Diagnosis not present

## 2014-01-27 DIAGNOSIS — Z905 Acquired absence of kidney: Secondary | ICD-10-CM | POA: Diagnosis present

## 2014-01-27 DIAGNOSIS — K219 Gastro-esophageal reflux disease without esophagitis: Secondary | ICD-10-CM | POA: Diagnosis present

## 2014-01-27 DIAGNOSIS — G43909 Migraine, unspecified, not intractable, without status migrainosus: Secondary | ICD-10-CM | POA: Diagnosis present

## 2014-01-27 DIAGNOSIS — Z9089 Acquired absence of other organs: Secondary | ICD-10-CM | POA: Diagnosis present

## 2014-01-27 DIAGNOSIS — G894 Chronic pain syndrome: Secondary | ICD-10-CM | POA: Diagnosis not present

## 2014-01-27 DIAGNOSIS — Z79899 Other long term (current) drug therapy: Secondary | ICD-10-CM | POA: Diagnosis not present

## 2014-01-27 DIAGNOSIS — K589 Irritable bowel syndrome without diarrhea: Secondary | ICD-10-CM | POA: Diagnosis present

## 2014-01-27 DIAGNOSIS — F329 Major depressive disorder, single episode, unspecified: Secondary | ICD-10-CM | POA: Diagnosis present

## 2014-01-27 DIAGNOSIS — Z90710 Acquired absence of both cervix and uterus: Secondary | ICD-10-CM | POA: Diagnosis not present

## 2014-01-27 DIAGNOSIS — E669 Obesity, unspecified: Secondary | ICD-10-CM | POA: Diagnosis present

## 2014-01-27 DIAGNOSIS — Z85528 Personal history of other malignant neoplasm of kidney: Secondary | ICD-10-CM | POA: Diagnosis not present

## 2014-02-04 DIAGNOSIS — N62 Hypertrophy of breast: Secondary | ICD-10-CM | POA: Diagnosis not present

## 2014-02-10 DIAGNOSIS — D225 Melanocytic nevi of trunk: Secondary | ICD-10-CM | POA: Diagnosis not present

## 2014-02-14 HISTORY — PX: REDUCTION MAMMAPLASTY: SUR839

## 2014-03-14 DIAGNOSIS — E039 Hypothyroidism, unspecified: Secondary | ICD-10-CM | POA: Diagnosis not present

## 2014-03-19 DIAGNOSIS — N62 Hypertrophy of breast: Secondary | ICD-10-CM | POA: Diagnosis not present

## 2014-04-16 DIAGNOSIS — Z79899 Other long term (current) drug therapy: Secondary | ICD-10-CM | POA: Diagnosis not present

## 2014-04-16 DIAGNOSIS — R5382 Chronic fatigue, unspecified: Secondary | ICD-10-CM | POA: Diagnosis not present

## 2014-04-16 DIAGNOSIS — Z9889 Other specified postprocedural states: Secondary | ICD-10-CM | POA: Insufficient documentation

## 2014-04-16 DIAGNOSIS — M2550 Pain in unspecified joint: Secondary | ICD-10-CM | POA: Diagnosis not present

## 2014-04-22 DIAGNOSIS — E559 Vitamin D deficiency, unspecified: Secondary | ICD-10-CM | POA: Diagnosis not present

## 2014-04-22 DIAGNOSIS — I10 Essential (primary) hypertension: Secondary | ICD-10-CM | POA: Diagnosis not present

## 2014-04-22 DIAGNOSIS — R000 Tachycardia, unspecified: Secondary | ICD-10-CM | POA: Diagnosis not present

## 2014-04-22 DIAGNOSIS — E039 Hypothyroidism, unspecified: Secondary | ICD-10-CM | POA: Diagnosis not present

## 2014-06-25 DIAGNOSIS — I781 Nevus, non-neoplastic: Secondary | ICD-10-CM | POA: Diagnosis not present

## 2014-06-30 DIAGNOSIS — M542 Cervicalgia: Secondary | ICD-10-CM | POA: Diagnosis not present

## 2014-06-30 DIAGNOSIS — M25511 Pain in right shoulder: Secondary | ICD-10-CM | POA: Diagnosis not present

## 2014-07-10 DIAGNOSIS — E611 Iron deficiency: Secondary | ICD-10-CM | POA: Diagnosis not present

## 2014-07-10 DIAGNOSIS — I10 Essential (primary) hypertension: Secondary | ICD-10-CM | POA: Diagnosis not present

## 2014-07-10 DIAGNOSIS — M797 Fibromyalgia: Secondary | ICD-10-CM | POA: Diagnosis not present

## 2014-07-10 DIAGNOSIS — K219 Gastro-esophageal reflux disease without esophagitis: Secondary | ICD-10-CM | POA: Diagnosis not present

## 2014-07-10 DIAGNOSIS — G2581 Restless legs syndrome: Secondary | ICD-10-CM | POA: Diagnosis not present

## 2014-09-16 DIAGNOSIS — L739 Follicular disorder, unspecified: Secondary | ICD-10-CM | POA: Diagnosis not present

## 2014-09-16 DIAGNOSIS — L723 Sebaceous cyst: Secondary | ICD-10-CM | POA: Diagnosis not present

## 2014-09-26 ENCOUNTER — Other Ambulatory Visit: Payer: Self-pay | Admitting: Physician Assistant

## 2014-09-26 DIAGNOSIS — M25511 Pain in right shoulder: Secondary | ICD-10-CM

## 2014-09-29 ENCOUNTER — Other Ambulatory Visit: Payer: Self-pay | Admitting: Internal Medicine

## 2014-09-29 ENCOUNTER — Ambulatory Visit
Admission: RE | Admit: 2014-09-29 | Discharge: 2014-09-29 | Disposition: A | Discharge status: Home / Self Care | Payer: 59 | Source: Ambulatory Visit | Attending: Internal Medicine | Admitting: Internal Medicine

## 2014-09-29 DIAGNOSIS — R109 Unspecified abdominal pain: Secondary | ICD-10-CM

## 2014-09-29 DIAGNOSIS — R140 Abdominal distension (gaseous): Secondary | ICD-10-CM | POA: Diagnosis not present

## 2014-09-29 DIAGNOSIS — R1013 Epigastric pain: Secondary | ICD-10-CM | POA: Diagnosis not present

## 2014-09-29 DIAGNOSIS — R110 Nausea: Secondary | ICD-10-CM | POA: Diagnosis not present

## 2014-09-29 DIAGNOSIS — R1032 Left lower quadrant pain: Secondary | ICD-10-CM | POA: Diagnosis not present

## 2014-09-29 MED ORDER — IOPAMIDOL (ISOVUE-300) INJECTION 61%
100.0000 mL | Freq: Once | INTRAVENOUS | Status: AC | PRN
  Administered 2014-09-29: 100 mL via INTRAVENOUS

## 2014-10-02 ENCOUNTER — Other Ambulatory Visit: Payer: 59

## 2014-10-10 DIAGNOSIS — E611 Iron deficiency: Secondary | ICD-10-CM | POA: Diagnosis not present

## 2014-10-28 DIAGNOSIS — L739 Follicular disorder, unspecified: Secondary | ICD-10-CM | POA: Diagnosis not present

## 2014-10-28 DIAGNOSIS — L309 Dermatitis, unspecified: Secondary | ICD-10-CM | POA: Diagnosis not present

## 2014-11-06 DIAGNOSIS — N289 Disorder of kidney and ureter, unspecified: Secondary | ICD-10-CM | POA: Diagnosis not present

## 2014-11-27 DIAGNOSIS — R5382 Chronic fatigue, unspecified: Secondary | ICD-10-CM | POA: Diagnosis not present

## 2014-11-27 DIAGNOSIS — G894 Chronic pain syndrome: Secondary | ICD-10-CM | POA: Diagnosis not present

## 2014-11-27 DIAGNOSIS — Z79899 Other long term (current) drug therapy: Secondary | ICD-10-CM | POA: Diagnosis not present

## 2015-01-06 DIAGNOSIS — Z23 Encounter for immunization: Secondary | ICD-10-CM | POA: Diagnosis not present

## 2015-01-06 DIAGNOSIS — R739 Hyperglycemia, unspecified: Secondary | ICD-10-CM | POA: Diagnosis not present

## 2015-01-06 DIAGNOSIS — E559 Vitamin D deficiency, unspecified: Secondary | ICD-10-CM | POA: Diagnosis not present

## 2015-01-06 DIAGNOSIS — E039 Hypothyroidism, unspecified: Secondary | ICD-10-CM | POA: Diagnosis not present

## 2015-01-06 DIAGNOSIS — Z0000 Encounter for general adult medical examination without abnormal findings: Secondary | ICD-10-CM | POA: Diagnosis not present

## 2015-01-06 DIAGNOSIS — M797 Fibromyalgia: Secondary | ICD-10-CM | POA: Diagnosis not present

## 2015-01-06 DIAGNOSIS — I10 Essential (primary) hypertension: Secondary | ICD-10-CM | POA: Diagnosis not present

## 2015-02-11 DIAGNOSIS — R509 Fever, unspecified: Secondary | ICD-10-CM | POA: Diagnosis not present

## 2015-02-11 DIAGNOSIS — M549 Dorsalgia, unspecified: Secondary | ICD-10-CM | POA: Diagnosis not present

## 2015-02-11 DIAGNOSIS — J069 Acute upper respiratory infection, unspecified: Secondary | ICD-10-CM | POA: Diagnosis not present

## 2015-02-14 ENCOUNTER — Emergency Department (HOSPITAL_COMMUNITY)
Admission: EM | Admit: 2015-02-14 | Discharge: 2015-02-14 | Disposition: A | Discharge status: Home / Self Care | Payer: 59 | Attending: Emergency Medicine | Admitting: Emergency Medicine

## 2015-02-14 ENCOUNTER — Emergency Department (HOSPITAL_COMMUNITY): Payer: 59

## 2015-02-14 ENCOUNTER — Encounter (HOSPITAL_COMMUNITY): Payer: Self-pay | Admitting: Nurse Practitioner

## 2015-02-14 DIAGNOSIS — Y9289 Other specified places as the place of occurrence of the external cause: Secondary | ICD-10-CM | POA: Insufficient documentation

## 2015-02-14 DIAGNOSIS — W01198A Fall on same level from slipping, tripping and stumbling with subsequent striking against other object, initial encounter: Secondary | ICD-10-CM | POA: Insufficient documentation

## 2015-02-14 DIAGNOSIS — Z87448 Personal history of other diseases of urinary system: Secondary | ICD-10-CM | POA: Insufficient documentation

## 2015-02-14 DIAGNOSIS — Z792 Long term (current) use of antibiotics: Secondary | ICD-10-CM | POA: Diagnosis not present

## 2015-02-14 DIAGNOSIS — Y9389 Activity, other specified: Secondary | ICD-10-CM | POA: Insufficient documentation

## 2015-02-14 DIAGNOSIS — Z79899 Other long term (current) drug therapy: Secondary | ICD-10-CM | POA: Diagnosis not present

## 2015-02-14 DIAGNOSIS — I10 Essential (primary) hypertension: Secondary | ICD-10-CM | POA: Insufficient documentation

## 2015-02-14 DIAGNOSIS — Y998 Other external cause status: Secondary | ICD-10-CM | POA: Insufficient documentation

## 2015-02-14 DIAGNOSIS — S62612A Displaced fracture of proximal phalanx of right middle finger, initial encounter for closed fracture: Secondary | ICD-10-CM | POA: Insufficient documentation

## 2015-02-14 DIAGNOSIS — Z85528 Personal history of other malignant neoplasm of kidney: Secondary | ICD-10-CM | POA: Insufficient documentation

## 2015-02-14 DIAGNOSIS — K219 Gastro-esophageal reflux disease without esophagitis: Secondary | ICD-10-CM | POA: Insufficient documentation

## 2015-02-14 DIAGNOSIS — M1990 Unspecified osteoarthritis, unspecified site: Secondary | ICD-10-CM | POA: Insufficient documentation

## 2015-02-14 DIAGNOSIS — S62629A Displaced fracture of medial phalanx of unspecified finger, initial encounter for closed fracture: Secondary | ICD-10-CM

## 2015-02-14 DIAGNOSIS — T148 Other injury of unspecified body region: Secondary | ICD-10-CM | POA: Diagnosis not present

## 2015-02-14 DIAGNOSIS — F329 Major depressive disorder, single episode, unspecified: Secondary | ICD-10-CM | POA: Insufficient documentation

## 2015-02-14 DIAGNOSIS — E079 Disorder of thyroid, unspecified: Secondary | ICD-10-CM | POA: Insufficient documentation

## 2015-02-14 DIAGNOSIS — M79644 Pain in right finger(s): Secondary | ICD-10-CM | POA: Diagnosis not present

## 2015-02-14 DIAGNOSIS — R0781 Pleurodynia: Secondary | ICD-10-CM | POA: Diagnosis not present

## 2015-02-14 DIAGNOSIS — S299XXA Unspecified injury of thorax, initial encounter: Secondary | ICD-10-CM | POA: Diagnosis present

## 2015-02-14 DIAGNOSIS — S20211A Contusion of right front wall of thorax, initial encounter: Secondary | ICD-10-CM | POA: Insufficient documentation

## 2015-02-14 MED ORDER — KETOROLAC TROMETHAMINE 30 MG/ML IJ SOLN
30.0000 mg | Freq: Once | INTRAMUSCULAR | Status: AC
  Administered 2015-02-14: 30 mg via INTRAVENOUS
  Filled 2015-02-14: qty 1

## 2015-02-14 MED ORDER — METHOCARBAMOL 500 MG PO TABS
500.0000 mg | ORAL_TABLET | Freq: Once | ORAL | Status: AC
Start: 2015-02-14 — End: 2015-02-14
  Administered 2015-02-14: 500 mg via ORAL
  Filled 2015-02-14: qty 1

## 2015-02-14 NOTE — ED Notes (Signed)
Patient suffered a mechanical fall this morning at 10:30 while ambulating in the bathroom. She struck the toilet with her back, slightly right of spine, and complains of right posterior rib pain. Denies spinal pain. Patient wearing fentanyl patch upon arrival.

## 2015-02-14 NOTE — Discharge Instructions (Signed)
Chest Contusion A chest contusion is a deep bruise on your chest area. Contusions are the result of an injury that caused bleeding under the skin. A chest contusion may involve bruising of the skin, muscles, or ribs. The contusion may turn blue, purple, or yellow. Minor injuries will give you a painless contusion, but more severe contusions may stay painful and swollen for a few weeks. CAUSES  A contusion is usually caused by a blow, trauma, or direct force to an area of the body. SYMPTOMS   Swelling and redness of the injured area.  Discoloration of the injured area.  Tenderness and soreness of the injured area.  Pain. DIAGNOSIS  The diagnosis can be made by taking a history and performing a physical exam. An X-ray, CT scan, or MRI may be needed to determine if there were any associated injuries, such as broken bones (fractures) or internal injuries. TREATMENT  Often, the best treatment for a chest contusion is resting, icing, and applying cold compresses to the injured area. Deep breathing exercises may be recommended to reduce the risk of pneumonia. Over-the-counter medicines may also be recommended for pain control. HOME CARE INSTRUCTIONS   Put ice on the injured area.  Put ice in a plastic bag.  Place a towel between your skin and the bag.  Leave the ice on for 15-20 minutes, 03-04 times a day.  Only take over-the-counter or prescription medicines as directed by your caregiver. Your caregiver may recommend avoiding anti-inflammatory medicines (aspirin, ibuprofen, and naproxen) for 48 hours because these medicines may increase bruising.  Rest the injured area.  Perform deep-breathing exercises as directed by your caregiver.  Stop smoking if you smoke.  Do not lift objects over 5 pounds (2.3 kg) for 3 days or longer if recommended by your caregiver. SEEK IMMEDIATE MEDICAL CARE IF:   You have increased bruising or swelling.  You have pain that is getting worse.  You have  difficulty breathing.  You have dizziness, weakness, or fainting.  You have blood in your urine or stool.  You cough up or vomit blood.  Your swelling or pain is not relieved with medicines. MAKE SURE YOU:   Understand these instructions.  Will watch your condition.  Will get help right away if you are not doing well or get worse.   This information is not intended to replace advice given to you by your health care provider. Make sure you discuss any questions you have with your health care provider.   Document Released: 10/26/2000 Document Revised: 10/26/2011 Document Reviewed: 07/25/2011 Elsevier Interactive Patient Education 2016 [employer]  

## 2015-02-14 NOTE — ED Notes (Signed)
Patient transported to X-ray 

## 2015-02-14 NOTE — ED Provider Notes (Signed)
CSN: [id number]     Arrival date & time 02/14/15  1122 History   First MD Initiated Contact with Patient 02/14/15 1144     Chief Complaint  Patient presents with  . Fall    right posterior rib     (Consider location/radiation/quality/duration/timing/severity/associated sxs/prior Treatment) HPI Comments: 54 year old female with past medical history including fibromyalgia, GERD, hypertension, renal cancer who presents with pain in her right back after a fall. At 10:30 this morning, the patient was in her bathroom when she slipped and fell backwards, striking her right posterior ribs on the toilet. She bumped her head but did not lose consciousness. She has had severe, constant pain in one focal area of her right posterior ribs. She also jammed her right middle finger and it is mildly painful. She denies any difficulty breathing, abdominal pain, or other injuries. She had a fentanyl patch at home for chest pain related to pneumonia and this has been helping the pain since the fall.  Patient is a 54 y.o. female presenting with fall. The history is provided by the patient.  Fall    Past Medical History  Diagnosis Date  . History of kidney cancer   . Fibromyalgia   . POTS (postural orthostatic tachycardia syndrome)   . GERD (gastroesophageal reflux disease)   . Hypertension   . Arthritis   . Thyroid disease   . PONV (postoperative nausea and vomiting)     need scope patch  . Interstitial cystitis   . Cancer [facility])     renal  . Depression    Past Surgical History  Procedure Laterality Date  . Kidney surgery  2011    rt partial nephrectomy-[facility]  . Cesarean section    . Abdominal hysterectomy  1999  . Tonsillectomy    . Colonoscopy    . Cystoscopy    . Bone biopsy Right 05/01/2013    Procedure: BIOPSY NAIL BED RIGHT THUMB, BIOPSY RIGHT GREAT TOE ;  Surgeon: Wynonia Sours, MD;  Location: [place];  Service: Orthopedics;  Laterality: Right;   Family History  Problem  Relation Age of Onset  . Melanoma Mother    Social History  Substance Use Topics  . Smoking status: Never Smoker   . Smokeless tobacco: None  . Alcohol Use: No   OB History    No data available     Review of Systems  10 Systems reviewed and are negative for acute change except as noted in the HPI.   Allergies  Hydromorphone; Meperidine; and Morphine  Home Medications   Prior to Admission medications   Medication Sig Start Date End Date Taking? Authorizing Provider  amphetamine-dextroamphetamine (ADDERALL XR) 20 MG 24 hr capsule Take 20 mg by mouth daily.   Yes Historical Provider, MD  azithromycin (ZITHROMAX) 250 MG tablet Take 250-500 mg by mouth daily.   Yes Historical Provider, MD  Cholecalciferol (VITAMIN D PO) Take 2,000 Units by mouth daily.   Yes Historical Provider, MD  DULoxetine (CYMBALTA) 30 MG capsule Take 30 mg by mouth daily.   Yes Historical Provider, MD  DULoxetine (CYMBALTA) 60 MG capsule Take 60 mg by mouth daily.   Yes Historical Provider, MD  eletriptan (RELPAX) 40 MG tablet One tablet by mouth at onset of headache. May repeat in 2 hours if headache persists or recurs. may repeat in 2 hours if necessary   Yes Historical Provider, MD  ferrous sulfate 325 (65 FE) MG tablet Take 325 mg by mouth daily with  breakfast.   Yes Historical Provider, MD  levothyroxine (SYNTHROID, LEVOTHROID) 100 MCG tablet Take 100 mcg by mouth daily before breakfast.   Yes Historical Provider, MD  losartan-hydrochlorothiazide (HYZAAR) 50-12.5 MG tablet Take 1 tablet by mouth daily.   Yes Historical Provider, MD  methocarbamol (ROBAXIN) 500 MG tablet Take 500 mg by mouth 4 (four) times daily as needed for muscle spasms (mostly takes three times daily, may take a fourth time if needed for pain.spasms). Patient uses this medication for pain.  She also states that she takes this medication prn.   Yes Historical Provider, MD  omeprazole (PRILOSEC) 40 MG capsule Take 40 mg by mouth daily.   Yes  Historical Provider, MD  OVER THE COUNTER MEDICATION Take 1 tablet by mouth daily. [id number]   Yes Historical Provider, MD  promethazine (PHENERGAN) 25 MG tablet Take 25 mg by mouth every 6 (six) hours as needed. n/v 02/06/12  Yes Historical Provider, MD  tapentadol (NUCYNTA) 50 MG TABS tablet Take 50 mg by mouth daily as needed (as needed for flares).   Yes Historical Provider, MD   BP 126/68 mmHg  Pulse 75  Temp(Src) 99 F (37.2 C) (Oral)  Resp 22  SpO2 98% Physical Exam  Constitutional: She is oriented to person, place, and time. She appears well-developed and well-nourished.  In mild distress due to pain  HENT:  Head: Normocephalic and atraumatic.  Moist mucous membranes  Eyes: Conjunctivae are normal. Pupils are equal, round, and reactive to light.  Neck: Neck supple.  Cardiovascular: Normal rate, regular rhythm and normal heart sounds.   No murmur heard. Pulmonary/Chest: Effort normal and breath sounds normal.  Abdominal: Soft. Bowel sounds are normal. She exhibits no distension. There is no tenderness.  Musculoskeletal: She exhibits no edema.  TTP R mid thoracic lateral back on ribs w/ no crepitus, no midline spinal tenderness; 5/5 strength and normal sensation throughout  Neurological: She is alert and oriented to person, place, and time.  Fluent speech  Skin: Skin is warm and dry.     Psychiatric: Judgment normal.  Distressed  Nursing note and vitals reviewed.   ED Course  Procedures (including critical care time) Labs Review Labs Reviewed - No data to display  Imaging Review Dg Ribs Unilateral W/chest Right  02/14/2015  CLINICAL DATA:  Acute right rib pain after fall today. EXAM: RIGHT RIBS AND CHEST - 3+ VIEW COMPARISON:  August 31, 2010. FINDINGS: No fracture or other bone lesions are seen involving the ribs. There is no evidence of pneumothorax or pleural effusion. Both lungs are clear. Heart size and mediastinal contours are within normal limits. IMPRESSION:  Normal right ribs.  No acute cardiopulmonary abnormality seen. Electronically Signed   By: Marijo Conception, M.D.   On: 02/14/2015 12:54   Dg Finger Middle Right  02/14/2015  CLINICAL DATA:  Acute right middle finger pain after fall today. EXAM: RIGHT MIDDLE FINGER 2+V COMPARISON:  None. FINDINGS: Possible minimally displaced fracture is seen involving the volar aspect of the proximal base of third middle phalanx. This appears to be closed and posttraumatic. No soft tissue abnormalities noted. IMPRESSION: Possible minimally displaced fracture involving the proximal base of the third middle phalanx. Electronically Signed   By: Marijo Conception, M.D.   On: 02/14/2015 12:59     EKG Interpretation None     Medications  ketorolac (TORADOL) 30 MG/ML injection 30 mg (30 mg Intravenous Given 02/14/15 1307)  methocarbamol (ROBAXIN) tablet 500 mg (500 mg Oral Given  02/14/15 1252)  \ MDM   Final diagnoses:  Chest wall contusion, right, initial encounter  Fracture of finger, middle phalanx, right, closed, initial encounter    Patient with right lateral thoracic back pain over her ribs after a fall from standing this morning. Patient was in moderate distress on exam and stated that she has a lot of back spasms chronically. She was neurologically intact. No loss of consciousness. Obtained plain films of ribs and right finger and gave the patient her home dose of Robaxin as well as Toradol; patient states her kidney function is normal and she denies chronic NSAID use.  No evidence of rib fracture or pneumothorax on chest x-ray. Possible proximal base of third middle phalanx fracture. Placed pt in finger splint w/ instructions on supportive care. Regarding posterior chest wall contusion, the patient has improved with the above medications and is breathing comfortably on room air. She already has pain management medications to use at home. Return precautions reviewed and patient instructed to follow-up with PCP  as needed. Patient discharged in satisfactory condition.  Sharlett Iles, MD 02/14/15 [phone]

## 2015-02-14 NOTE — ED Notes (Signed)
Bed: WA03 Expected date: 02/14/15 Expected time: 11:28 AM Means of arrival: Ambulance Comments: Fall, rib pain

## 2015-04-24 DIAGNOSIS — Z6834 Body mass index (BMI) 34.0-34.9, adult: Secondary | ICD-10-CM | POA: Diagnosis not present

## 2015-04-24 DIAGNOSIS — E039 Hypothyroidism, unspecified: Secondary | ICD-10-CM | POA: Diagnosis not present

## 2015-04-24 DIAGNOSIS — I10 Essential (primary) hypertension: Secondary | ICD-10-CM | POA: Diagnosis not present

## 2015-04-24 DIAGNOSIS — E669 Obesity, unspecified: Secondary | ICD-10-CM | POA: Diagnosis not present

## 2015-04-24 DIAGNOSIS — Z79899 Other long term (current) drug therapy: Secondary | ICD-10-CM | POA: Diagnosis not present

## 2015-05-12 ENCOUNTER — Other Ambulatory Visit: Payer: Self-pay | Admitting: Physician Assistant

## 2015-05-12 DIAGNOSIS — L280 Lichen simplex chronicus: Secondary | ICD-10-CM | POA: Diagnosis not present

## 2015-05-12 DIAGNOSIS — D239 Other benign neoplasm of skin, unspecified: Secondary | ICD-10-CM | POA: Diagnosis not present

## 2015-05-12 DIAGNOSIS — L821 Other seborrheic keratosis: Secondary | ICD-10-CM | POA: Diagnosis not present

## 2015-05-12 DIAGNOSIS — D485 Neoplasm of uncertain behavior of skin: Secondary | ICD-10-CM | POA: Diagnosis not present

## 2015-05-14 DIAGNOSIS — Z6834 Body mass index (BMI) 34.0-34.9, adult: Secondary | ICD-10-CM | POA: Diagnosis not present

## 2015-05-14 DIAGNOSIS — R7301 Impaired fasting glucose: Secondary | ICD-10-CM | POA: Diagnosis not present

## 2015-06-10 DIAGNOSIS — E039 Hypothyroidism, unspecified: Secondary | ICD-10-CM | POA: Diagnosis not present

## 2015-08-10 DIAGNOSIS — E039 Hypothyroidism, unspecified: Secondary | ICD-10-CM | POA: Diagnosis not present

## 2015-08-17 DIAGNOSIS — G43009 Migraine without aura, not intractable, without status migrainosus: Secondary | ICD-10-CM | POA: Diagnosis not present

## 2015-08-17 DIAGNOSIS — R509 Fever, unspecified: Secondary | ICD-10-CM | POA: Diagnosis not present

## 2015-08-31 DIAGNOSIS — M26629 Arthralgia of temporomandibular joint, unspecified side: Secondary | ICD-10-CM | POA: Diagnosis not present

## 2015-08-31 DIAGNOSIS — G43709 Chronic migraine without aura, not intractable, without status migrainosus: Secondary | ICD-10-CM | POA: Diagnosis not present

## 2015-08-31 DIAGNOSIS — N3010 Interstitial cystitis (chronic) without hematuria: Secondary | ICD-10-CM | POA: Diagnosis not present

## 2015-08-31 DIAGNOSIS — M797 Fibromyalgia: Secondary | ICD-10-CM | POA: Diagnosis not present

## 2015-09-01 DIAGNOSIS — G44201 Tension-type headache, unspecified, intractable: Secondary | ICD-10-CM | POA: Diagnosis not present

## 2015-09-01 DIAGNOSIS — I10 Essential (primary) hypertension: Secondary | ICD-10-CM | POA: Diagnosis not present

## 2015-09-17 DIAGNOSIS — B028 Zoster with other complications: Secondary | ICD-10-CM | POA: Diagnosis not present

## 2015-09-17 DIAGNOSIS — R590 Localized enlarged lymph nodes: Secondary | ICD-10-CM | POA: Diagnosis not present

## 2015-09-30 DIAGNOSIS — E669 Obesity, unspecified: Secondary | ICD-10-CM | POA: Diagnosis not present

## 2015-09-30 DIAGNOSIS — Z6831 Body mass index (BMI) 31.0-31.9, adult: Secondary | ICD-10-CM | POA: Diagnosis not present

## 2015-09-30 DIAGNOSIS — I10 Essential (primary) hypertension: Secondary | ICD-10-CM | POA: Diagnosis not present

## 2015-10-05 DIAGNOSIS — Z1231 Encounter for screening mammogram for malignant neoplasm of breast: Secondary | ICD-10-CM | POA: Diagnosis not present

## 2015-11-25 ENCOUNTER — Other Ambulatory Visit: Payer: Self-pay | Admitting: Physician Assistant

## 2015-11-25 DIAGNOSIS — B078 Other viral warts: Secondary | ICD-10-CM | POA: Diagnosis not present

## 2015-11-26 DIAGNOSIS — D485 Neoplasm of uncertain behavior of skin: Secondary | ICD-10-CM | POA: Diagnosis not present

## 2015-12-03 ENCOUNTER — Ambulatory Visit (INDEPENDENT_AMBULATORY_CARE_PROVIDER_SITE_OTHER): Payer: 59 | Admitting: Sports Medicine

## 2015-12-03 DIAGNOSIS — M797 Fibromyalgia: Secondary | ICD-10-CM | POA: Diagnosis not present

## 2015-12-03 DIAGNOSIS — S92502A Displaced unspecified fracture of left lesser toe(s), initial encounter for closed fracture: Secondary | ICD-10-CM

## 2015-12-03 DIAGNOSIS — S92902S Unspecified fracture of left foot, sequela: Secondary | ICD-10-CM | POA: Diagnosis not present

## 2015-12-03 DIAGNOSIS — Z23 Encounter for immunization: Secondary | ICD-10-CM | POA: Diagnosis not present

## 2015-12-03 DIAGNOSIS — G894 Chronic pain syndrome: Secondary | ICD-10-CM | POA: Diagnosis not present

## 2015-12-03 DIAGNOSIS — M8589 Other specified disorders of bone density and structure, multiple sites: Secondary | ICD-10-CM | POA: Diagnosis not present

## 2015-12-29 ENCOUNTER — Other Ambulatory Visit: Payer: Self-pay | Admitting: Internal Medicine

## 2015-12-29 DIAGNOSIS — S92902A Unspecified fracture of left foot, initial encounter for closed fracture: Secondary | ICD-10-CM

## 2015-12-29 DIAGNOSIS — M8580 Other specified disorders of bone density and structure, unspecified site: Secondary | ICD-10-CM

## 2015-12-31 ENCOUNTER — Ambulatory Visit (INDEPENDENT_AMBULATORY_CARE_PROVIDER_SITE_OTHER): Payer: 59 | Admitting: Sports Medicine

## 2015-12-31 ENCOUNTER — Ambulatory Visit (INDEPENDENT_AMBULATORY_CARE_PROVIDER_SITE_OTHER): Payer: 59

## 2015-12-31 ENCOUNTER — Encounter (INDEPENDENT_AMBULATORY_CARE_PROVIDER_SITE_OTHER): Payer: Self-pay | Admitting: Sports Medicine

## 2015-12-31 VITALS — BP 137/84 | HR 106 | Temp 100.2°F | Ht 65.0 in | Wt 185.0 lb

## 2015-12-31 DIAGNOSIS — M79672 Pain in left foot: Secondary | ICD-10-CM

## 2015-12-31 DIAGNOSIS — S92502G Displaced unspecified fracture of left lesser toe(s), subsequent encounter for fracture with delayed healing: Secondary | ICD-10-CM | POA: Diagnosis not present

## 2015-12-31 MED ORDER — VITAMIN D3 1.25 MG (50000 UT) PO CAPS
1.0000 | ORAL_CAPSULE | ORAL | 0 refills | Status: DC
Start: 2015-12-31 — End: 2016-02-27

## 2015-12-31 NOTE — Patient Instructions (Signed)
Viactiv calcium supplement Take 1 twice daily

## 2015-12-31 NOTE — Progress Notes (Signed)
Angel Sellers - 55 y.o. female MRN [id number]  Date of birth: 1960/10/31  Office Visit Note: Visit Date: 12/31/2015 PCP: Irven Shelling, MD Referred by: Lavone Orn, MD  Subjective: Chief Complaint  Patient presents with  . Left Foot - Follow-up  . left pinky toe    Patient states left foot still hurts and she can't wear any shoes, and can't walk.  States swelling in left pinky toe.   HPI: Having consistent pain. Unable to walk with regular shoes on but day-to-day activities & Croc sandals has been tolerable. She is having worsening somatic complaints secondary to her fibromyalgia given the inability to walk for exercise. She has not coming DEXA scan for osteoporosis evaluation given prior fractures that she has had. She has had a long-term issue with thyroid dysfunction & is currently on fluctuating doses of Synthroid area reportedly physiologic TSH at last visit. ROS: Otherwise per HPI.   Clinical History: No specialty comments available.  She reports that she has never smoked. She does not have any smokeless tobacco history on file.  No results for input(s): HGBA1C, LABURIC in the last 8760 hours.  Assessment & Plan: Visit Diagnoses:  1. Closed fracture of phalanx of left fifth toe with delayed healing, subsequent encounter   2. Pain in left foot    Plan: Slow healing fracture. Will try to optimize vitamin D & calcium intake. She has not coming DEXA scan next week. Until this is healed, I would recommend against acute treatment with bisphosphonates. Could consider Forteo.  If any persistent symptoms for additional weeks could consider fusion of the joint however this obviously has complications & exhausting nonoperative interventions as appropriate.  Follow-up: Return in about 4 weeks (around 01/28/2016).  Meds:  Meds ordered this encounter  Medications  . Cholecalciferol (VITAMIN D3) 50000 units CAPS    Sig: Take 1 capsule by mouth once a week.    Dispense:  8 capsule      Refill:  0    Do not substitute for D2   Procedures: No notes on file   Objective:  VS:  HT:5\' 5"  (165.1 cm)   WT:185 lb (83.9 kg)  BMI:30.8    BP:137/84  HR:(!) 106bpm  TEMP:100.2 F (37.9 C)( )  RESP:  Physical Exam: Adult female. No acute distress. Alert & appropriate.  Left fifth toe overall well aligned with marked tenderness to palpation in hyperesthesia over the IP joint. Nail is dystrophic on all 5 toes. DP & PT pulses 2+/4. No significant pain straight leg raise although she does have a cramp that quickly resolves in doing so that is localized to her lateral thigh. Sensation is otherwise intact to light touch. Imaging: Xr Toe 5th Left  Result Date: 12/31/2015 Findings: 2V left fifth toe. Transverse fracture involving approximately 50% of the articular surface of the IP joint. There is only mild displacement that is stable from prior films, overall well aligned Impression: Mildly displaced intra-articular fracture of the left fifth distal proximal phalanx     Past Medical/Family/Surgical/Social History: Medications & Allergies reviewed per EMR Patient Active Problem List   Diagnosis Date Noted  . Thumbnail injury 04/10/2013  . MIGRAINE, CHRONIC 03/18/2008  . HYPERTENSION 03/18/2008  . COUGH 03/18/2008   Past Medical History:  Diagnosis Date  . Arthritis   . Cancer ([place])    renal  . Depression   . Fibromyalgia   . GERD (gastroesophageal reflux disease)   . History of kidney cancer   . Hypertension   .  Interstitial cystitis   . PONV (postoperative nausea and vomiting)    need scope patch  . POTS (postural orthostatic tachycardia syndrome)   . Thyroid disease    Family History  Problem Relation Age of Onset  . Melanoma Mother    Past Surgical History:  Procedure Laterality Date  . ABDOMINAL HYSTERECTOMY  1999  . BONE BIOPSY Right 05/01/2013   Procedure: BIOPSY NAIL BED RIGHT THUMB, BIOPSY RIGHT GREAT TOE ;  Surgeon: Wynonia Sours, MD;  Location: [place];  Service: Orthopedics;  Laterality: Right;  . CESAREAN SECTION    . COLONOSCOPY    . CYSTOSCOPY    . KIDNEY SURGERY  2011   rt partial nephrectomy-[facility]  . TONSILLECTOMY     Social History   Occupational History  . Not on file.   Social History Main Topics  . Smoking status: Never Smoker  . Smokeless tobacco: Not on file  . Alcohol use No  . Drug use: Unknown  . Sexual activity: Not on file

## 2016-01-04 ENCOUNTER — Ambulatory Visit
Admission: RE | Admit: 2016-01-04 | Discharge: 2016-01-04 | Disposition: A | Discharge status: Home / Self Care | Payer: 59 | Source: Ambulatory Visit | Attending: Internal Medicine | Admitting: Internal Medicine

## 2016-01-04 DIAGNOSIS — M8589 Other specified disorders of bone density and structure, multiple sites: Secondary | ICD-10-CM | POA: Diagnosis not present

## 2016-01-04 DIAGNOSIS — S92902A Unspecified fracture of left foot, initial encounter for closed fracture: Secondary | ICD-10-CM

## 2016-01-04 DIAGNOSIS — M8580 Other specified disorders of bone density and structure, unspecified site: Secondary | ICD-10-CM

## 2016-01-04 DIAGNOSIS — Z780 Asymptomatic menopausal state: Secondary | ICD-10-CM | POA: Diagnosis not present

## 2016-01-13 DIAGNOSIS — I10 Essential (primary) hypertension: Secondary | ICD-10-CM | POA: Diagnosis not present

## 2016-01-13 DIAGNOSIS — K219 Gastro-esophageal reflux disease without esophagitis: Secondary | ICD-10-CM | POA: Diagnosis not present

## 2016-01-13 DIAGNOSIS — R7301 Impaired fasting glucose: Secondary | ICD-10-CM | POA: Diagnosis not present

## 2016-01-13 DIAGNOSIS — Z0000 Encounter for general adult medical examination without abnormal findings: Secondary | ICD-10-CM | POA: Diagnosis not present

## 2016-01-20 DIAGNOSIS — K432 Incisional hernia without obstruction or gangrene: Secondary | ICD-10-CM | POA: Diagnosis not present

## 2016-01-25 ENCOUNTER — Ambulatory Visit: Payer: Self-pay | Admitting: General Surgery

## 2016-01-28 ENCOUNTER — Encounter (INDEPENDENT_AMBULATORY_CARE_PROVIDER_SITE_OTHER): Payer: Self-pay | Admitting: Sports Medicine

## 2016-01-28 ENCOUNTER — Ambulatory Visit (INDEPENDENT_AMBULATORY_CARE_PROVIDER_SITE_OTHER): Payer: 59

## 2016-01-28 ENCOUNTER — Ambulatory Visit (INDEPENDENT_AMBULATORY_CARE_PROVIDER_SITE_OTHER): Payer: 59 | Admitting: Sports Medicine

## 2016-01-28 VITALS — BP 137/90 | HR 109 | Temp 100.3°F | Ht 65.0 in | Wt 185.0 lb

## 2016-01-28 DIAGNOSIS — S92502G Displaced unspecified fracture of left lesser toe(s), subsequent encounter for fracture with delayed healing: Secondary | ICD-10-CM | POA: Diagnosis not present

## 2016-01-28 DIAGNOSIS — M79672 Pain in left foot: Secondary | ICD-10-CM

## 2016-01-28 NOTE — Progress Notes (Signed)
Angel Sellers - 55 y.o. female MRN [id number]  Date of birth: [birth year]  Office Visit Note: Visit Date: 01/28/2016 PCP: Irven Shelling, MD Referred by: Lavone Orn, MD  Subjective: Chief Complaint  Patient presents with  . Left Foot - Follow-up  . Left Pinky Toe    Patient states left pinky toe is the same.  States still swollen, pink, and hurts.   HPI: Patient continues to have redness, swelling & pain of her left fifth toe in spite of shoewear that she uses. She has previously tried buddy taping without significant improvement.  She is otherwise feeling well although her fibromyalgia does give her a moderate amount of pain & limitation throughout the day on a daily basis. This does seem to be significantly different than her apical fibromyalgia pain. ROS: Otherwise per HPI.   Clinical History: No specialty comments available.  She reports that she has never smoked. She does not have any smokeless tobacco history on file.  No results for input(s): HGBA1C, LABURIC in the last 8760 hours.  Assessment & Plan: Visit Diagnoses:    ICD-9-CM ICD-10-CM   1. Pain in left foot 729.5 M79.672 XR Toe 5th Left  2. Closed fracture of phalanx of left fifth toe with delayed healing, subsequent encounter V54.16 S92.502G XR Toe 5th Left    Plan: Given the findings on x-ray today & persistent symptoms of nonhealing while optimized on vitamin D & calcium intake surgical fusion is indicated.  She has an upcoming abdominal hernia repair scheduled & she would like to try to coordinate surgery so that both can be performed in proximity to each other so that her overall downtime is lessened. She is tachycardic & hyperthermic at baseline but afebrile. Her recent bone density test was reviewed today as well & did show osteopenia without evidence of osteoporosis. No further medical therapy indicated although she should continue with weightbearing activities & appropriate calcium & vitamin D intake. Dr.  Sharol Given did see the patient today in will plan to schedule her for a fusion of the left fifth toe. Risks benefits & expected outcomes were discussed with the patient & she voices understanding.  Follow-up: Return in about 2 weeks (around 02/11/2016) for Postop.  Meds: No orders of the defined types were placed in this encounter.  Procedures: No notes on file   Objective:  VS:  HT:5\' 5"  (165.1 cm)   WT:185 lb (83.9 kg)  BMI:30.8    BP:137/90  HR:(!) 109bpm  TEMP:100.3 F (37.9 C)( )  RESP:  Physical Exam:  Adult female. No acute distress. Alert & appropriate.  Left fifth toe overall well aligned with marked tenderness to palpation in hyperesthesia over the IP joint. Nail is dystrophic on all 5 toes. DP & PT pulses 2+/4.  Slight erythema is present. Sensation is otherwise intact to light touch. Imaging: Dg Bone Density (dxa)  Result Date: 01/04/2016 EXAM: DUAL X-RAY ABSORPTIOMETRY (DXA) FOR BONE MINERAL DENSITY IMPRESSION: Referring Physician:  Candee Furbish PATIENT: Name: Angel, Sellers Patient ID: [id number] Birth Date: [birth year] Height: 65.0 in. Sex: Female Measured: 01/04/2016 Weight: 190.0 lbs. Indications: Caucasian, Estrogen Deficient, Hypothyroid, Hysterectomy, Low Calcium Intake (269.3), Postmenopausal, Synthroid, History of Fracture (Adult) (V15.51) Fractures: Finger, Rib, Toe Treatments: Vitamin D (E933.5) ASSESSMENT: The BMD measured at Femur Neck Right is 0.890 g/cm2 with a T-score of -1.1. This patient is considered osteopenic according to [facility]) criteria. Lumbar spine was not utilized due to advanced degenerative changes. Site Region Measured Date Measured  Age YA BMD Significant CHANGE T-score DualFemur Neck Right 01/04/2016 55.3 -1.1 0.890 g/cm2 Left Forearm Radius 33% 01/04/2016 55.3 -1.1 0.789 g/cm2 World Health Organization [facility]) criteria for post-menopausal, Caucasian Women: Normal       T-score at or above -1 SD Osteopenia   T-score between -1 and -2.5  SD Osteoporosis T-score at or below -2.5 SD RECOMMENDATION: [place] recommends that FDA-approved medical therapies be considered in postmenopausal women and men age 31 or older with a: 1. Hip or vertebral (clinical or morphometric) fracture. 2. T-score of <-2.5 at the spine or hip. 3. Ten-year fracture probability by FRAX of 3% or greater for hip fracture or 20% or greater for major osteoporotic fracture. All treatment decisions require clinical judgment and consideration of individual patient factors, including patient preferences, co-morbidities, previous drug use, risk factors not captured in the FRAX model (e.g. falls, vitamin D deficiency, increased bone turnover, interval significant decline in bone density) and possible under - or over-estimation of fracture risk by FRAX. All patients should ensure an adequate intake of dietary calcium (1200 mg/d) and vitamin D (800 IU daily) unless contraindicated. FOLLOW-UP: People with diagnosed cases of osteoporosis or at high risk for fracture should have regular bone mineral density tests. For patients eligible for Medicare, routine testing is allowed once every 2 years. The testing frequency can be increased to one year for patients who have rapidly progressing disease, those who are receiving or discontinuing medical therapy to restore bone mass, or have additional risk factors. I have reviewed this report, and agree with the above findings. [facility] FRAX* 10-year Probability of Fracture Based on femoral neck BMD: DualFemur (Right) Major Osteoporotic Fracture: 10.1% Hip Fracture:                0.6% Population:                  [place] (Caucasian) Risk Factors:                History of Fracture (Adult) (V15.51) *FRAX is a Materials engineer of the [employer] of [employer] for Metabolic Bone Disease, a World Pharmacologist (WHO) [facility]. ASSESSMENT: The probability of a major osteoporotic fracture is  10.1 % within the next ten years. The probability of a hip fracture is 0.6 % within the next ten years. Electronically Signed   By: Rolm Baptise M.D.   On: 01/04/2016 12:46   Xr Toe 5th Left  Result Date: 01/28/2016 Findings: 3V left fifth toe.  Oblique fracture involving approximately 50% of the articular surface of the IP joint. 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 Transverse fracture involving approximately 50% of the articular surface of the IP joint. There is only mild displacement that is stable from prior films, overall well aligned Impression: Mildly displaced intra-articular fracture of the left fifth distal proximal phalanx    Past Medical/Family/Surgical/Social History: Medications & Allergies reviewed per EMR Patient Active Problem List   Diagnosis Date Noted  . Thumbnail injury 04/10/2013  . MIGRAINE, CHRONIC 03/18/2008  . HYPERTENSION 03/18/2008  . COUGH 03/18/2008   Past Medical History:  Diagnosis Date  . Arthritis   . Cancer ([place])    renal  . Depression   . Fibromyalgia   .  GERD (gastroesophageal reflux disease)   . History of kidney cancer   . Hypertension   . Interstitial cystitis   . PONV (postoperative nausea and vomiting)    need scope patch  . POTS (postural orthostatic tachycardia syndrome)   . Thyroid disease    Family History  Problem Relation Age of Onset  . Melanoma Mother    Past Surgical History:  Procedure Laterality Date  . ABDOMINAL HYSTERECTOMY  1999  . BONE BIOPSY Right 05/01/2013   Procedure: BIOPSY NAIL BED RIGHT THUMB, BIOPSY RIGHT GREAT TOE ;  Surgeon: Wynonia Sours, MD;  Location: [place];  Service: Orthopedics;  Laterality: Right;  . CESAREAN SECTION    .  COLONOSCOPY    . CYSTOSCOPY    . KIDNEY SURGERY  2011   rt partial nephrectomy-[facility]  . TONSILLECTOMY     Social History   Occupational History  . Not on file.   Social History Main Topics  . Smoking status: Never Smoker  . Smokeless tobacco: Not on file  . Alcohol use No  . Drug use: Unknown  . Sexual activity: Not on file

## 2016-02-18 NOTE — Pre-Procedure Instructions (Signed)
    Angel Sellers  02/18/2016      [id number] - [place], [address] [place] [address] Phone: [phone] Fax: [place] [address] [place] - [id number] [place] [place] [address] Phone: [phone] Fax: [phone]    Your procedure is scheduled on Friday, January 12th.   Report to [facility] Admitting at 10:15 AM .  Call this number if you have problems the MORNING of surgery:  [phone]   Remember:  Do not eat food or drink liquids after midnight Thursday.  Take these medicines the morning of surgery with A SIP OF WATER : Cymbalta, Levothyroxine, Methocarbamol, Omeprazole              4-5 days prior to surgery, STOP taking any herbal supplements, Vitamins, Anti-inflammatories.   Do not wear jewelry, make-up or nail polish.  Do not wear lotions, powders, or perfumes, or deoderant.   Do not shave underarms & legs 48 hours prior to surgery.    Do not bring valuables to the hospital.  [facility] is not responsible for any belongings or valuables.  Contacts, dentures or bridgework may not be worn into surgery.  Leave your suitcase in the car.  After surgery it may be brought to your room.  For patients admitted to the hospital, discharge time will be determined by your treatment team.  Please read over the following fact sheets that you were given. Pain Booklet and Surgical Site Infection Prevention

## 2016-02-19 ENCOUNTER — Encounter (HOSPITAL_COMMUNITY)
Admission: RE | Admit: 2016-02-19 | Discharge: 2016-02-19 | Disposition: A | Discharge status: Home / Self Care | Payer: 59 | Source: Ambulatory Visit | Attending: Orthopedic Surgery | Admitting: Orthopedic Surgery

## 2016-02-19 ENCOUNTER — Encounter (HOSPITAL_COMMUNITY): Payer: Self-pay

## 2016-02-19 DIAGNOSIS — K469 Unspecified abdominal hernia without obstruction or gangrene: Secondary | ICD-10-CM | POA: Insufficient documentation

## 2016-02-19 DIAGNOSIS — Z01812 Encounter for preprocedural laboratory examination: Secondary | ICD-10-CM | POA: Insufficient documentation

## 2016-02-19 DIAGNOSIS — M79672 Pain in left foot: Secondary | ICD-10-CM | POA: Insufficient documentation

## 2016-02-19 DIAGNOSIS — S92502G Displaced unspecified fracture of left lesser toe(s), subsequent encounter for fracture with delayed healing: Secondary | ICD-10-CM | POA: Diagnosis not present

## 2016-02-19 HISTORY — DX: Headache: R51

## 2016-02-19 HISTORY — DX: Headache, unspecified: R51.9

## 2016-02-19 HISTORY — DX: Chronic fatigue, unspecified: R53.82

## 2016-02-19 HISTORY — DX: Hypothyroidism, unspecified: E03.9

## 2016-02-19 LAB — COMPREHENSIVE METABOLIC PANEL
ALT: 24 U/L (ref 14–54)
AST: 23 U/L (ref 15–41)
Albumin: 4 g/dL (ref 3.5–5.0)
Alkaline Phosphatase: 65 U/L (ref 38–126)
Anion gap: 9 (ref 5–15)
BUN: 12 mg/dL (ref 6–20)
CHLORIDE: 105 mmol/L (ref 101–111)
CO2: 22 mmol/L (ref 22–32)
CREATININE: 0.92 mg/dL (ref 0.44–1.00)
Calcium: 9.1 mg/dL (ref 8.9–10.3)
GFR calc non Af Amer: >60 mL/min (ref >60)
Glucose, Bld: 135 mg/dL — ABNORMAL HIGH (ref 65–99)
POTASSIUM: 3.9 mmol/L (ref 3.5–5.1)
SODIUM: 136 mmol/L (ref 135–145)
Total Bilirubin: 0.4 mg/dL (ref 0.3–1.2)
Total Protein: 6.9 g/dL (ref 6.5–8.1)

## 2016-02-19 LAB — CBC
HCT: 40.7 % (ref 36.0–46.0)
Hemoglobin: 13.5 g/dL (ref 12.0–15.0)
MCH: 28.3 pg (ref 26.0–34.0)
MCHC: 33.2 g/dL (ref 30.0–36.0)
MCV: 85.3 fL (ref 78.0–100.0)
PLATELETS: 246 10*3/uL (ref 150–400)
RBC: 4.77 MIL/uL (ref 3.87–5.11)
RDW: 13.4 % (ref 11.5–15.5)
WBC: 6.8 10*3/uL (ref 4.0–10.5)

## 2016-02-19 LAB — SURGICAL PCR SCREEN
MRSA, PCR: NEGATIVE
Staphylococcus aureus: NEGATIVE

## 2016-02-19 NOTE — Progress Notes (Addendum)
PCP is Dr. Electa Sniff  LOV 01/2016 Denies any heart issues. Never has seen a cardio. Been diagosed with POTS States she had sleep study back in 2010  -- negative results. Suffers from fibro fog, so short term memory is affected. No oders at PAT from Dr. Sharol Given.   She is having another surgery in Feb. By Dr. Redmond Pulling - hernia repair.

## 2016-02-22 ENCOUNTER — Other Ambulatory Visit (INDEPENDENT_AMBULATORY_CARE_PROVIDER_SITE_OTHER): Payer: Self-pay | Admitting: Family

## 2016-02-26 ENCOUNTER — Ambulatory Visit (HOSPITAL_COMMUNITY)
Admission: RE | Admit: 2016-02-26 | Discharge: 2016-02-27 | Disposition: A | Discharge status: Home / Self Care | Payer: 59 | Source: Ambulatory Visit | Attending: Orthopedic Surgery | Admitting: Orthopedic Surgery

## 2016-02-26 ENCOUNTER — Encounter (HOSPITAL_COMMUNITY)
Admission: RE | Disposition: A | Discharge status: Home / Self Care | Payer: Self-pay | Source: Ambulatory Visit | Attending: Orthopedic Surgery

## 2016-02-26 ENCOUNTER — Ambulatory Visit (HOSPITAL_COMMUNITY): Payer: 59 | Admitting: Anesthesiology

## 2016-02-26 ENCOUNTER — Encounter (HOSPITAL_COMMUNITY): Payer: Self-pay | Admitting: *Deleted

## 2016-02-26 DIAGNOSIS — X58XXXD Exposure to other specified factors, subsequent encounter: Secondary | ICD-10-CM | POA: Insufficient documentation

## 2016-02-26 DIAGNOSIS — E738 Other lactose intolerance: Secondary | ICD-10-CM | POA: Insufficient documentation

## 2016-02-26 DIAGNOSIS — Z90710 Acquired absence of both cervix and uterus: Secondary | ICD-10-CM | POA: Insufficient documentation

## 2016-02-26 DIAGNOSIS — S92352K Displaced fracture of fifth metatarsal bone, left foot, subsequent encounter for fracture with nonunion: Secondary | ICD-10-CM | POA: Diagnosis not present

## 2016-02-26 DIAGNOSIS — Z888 Allergy status to other drugs, medicaments and biological substances status: Secondary | ICD-10-CM | POA: Insufficient documentation

## 2016-02-26 DIAGNOSIS — S92522S Displaced fracture of medial phalanx of left lesser toe(s), sequela: Secondary | ICD-10-CM

## 2016-02-26 DIAGNOSIS — S92522A Displaced fracture of medial phalanx of left lesser toe(s), initial encounter for closed fracture: Secondary | ICD-10-CM

## 2016-02-26 DIAGNOSIS — F329 Major depressive disorder, single episode, unspecified: Secondary | ICD-10-CM | POA: Insufficient documentation

## 2016-02-26 DIAGNOSIS — S92592K Other fracture of left lesser toe(s), subsequent encounter for fracture with nonunion: Secondary | ICD-10-CM | POA: Insufficient documentation

## 2016-02-26 DIAGNOSIS — Q7022 Fused toes, left foot: Secondary | ICD-10-CM

## 2016-02-26 DIAGNOSIS — M1990 Unspecified osteoarthritis, unspecified site: Secondary | ICD-10-CM | POA: Diagnosis not present

## 2016-02-26 DIAGNOSIS — M797 Fibromyalgia: Secondary | ICD-10-CM | POA: Insufficient documentation

## 2016-02-26 DIAGNOSIS — E039 Hypothyroidism, unspecified: Secondary | ICD-10-CM | POA: Diagnosis not present

## 2016-02-26 DIAGNOSIS — Z885 Allergy status to narcotic agent status: Secondary | ICD-10-CM | POA: Diagnosis not present

## 2016-02-26 DIAGNOSIS — I10 Essential (primary) hypertension: Secondary | ICD-10-CM | POA: Insufficient documentation

## 2016-02-26 DIAGNOSIS — K219 Gastro-esophageal reflux disease without esophagitis: Secondary | ICD-10-CM | POA: Insufficient documentation

## 2016-02-26 DIAGNOSIS — Q7020 Fused toes, unspecified foot: Secondary | ICD-10-CM

## 2016-02-26 DIAGNOSIS — Z85528 Personal history of other malignant neoplasm of kidney: Secondary | ICD-10-CM | POA: Diagnosis not present

## 2016-02-26 HISTORY — PX: DISTAL INTERPHALANGEAL JOINT FUSION: SHX6428

## 2016-02-26 SURGERY — DISTAL INTERPHALANGEAL JOINT FUSION
Anesthesia: Monitor Anesthesia Care | Site: Foot | Laterality: Left

## 2016-02-26 MED ORDER — LEVOTHYROXINE SODIUM 25 MCG PO TABS
125.0000 ug | ORAL_TABLET | ORAL | Status: DC
  Administered 2016-02-27: 125 ug via ORAL
  Filled 2016-02-26: qty 1

## 2016-02-26 MED ORDER — LIDOCAINE HCL (CARDIAC) 20 MG/ML IV SOLN
INTRAVENOUS | Status: DC | PRN
  Administered 2016-02-26: 100 mg via INTRATRACHEAL

## 2016-02-26 MED ORDER — PROMETHAZINE HCL 25 MG PO TABS: 25.0000 mg | ORAL_TABLET | Freq: Four times a day (QID) | ORAL | Status: DC | PRN

## 2016-02-26 MED ORDER — IRBESARTAN 75 MG PO TABS
37.5000 mg | ORAL_TABLET | Freq: Every day | ORAL | Status: DC
  Filled 2016-02-26: qty 0.5

## 2016-02-26 MED ORDER — MIDAZOLAM HCL 5 MG/5ML IJ SOLN
INTRAMUSCULAR | Status: DC | PRN
  Administered 2016-02-26: 2 mg via INTRAVENOUS

## 2016-02-26 MED ORDER — FENTANYL 12 MCG/HR TD PT72
12.5000 ug | MEDICATED_PATCH | TRANSDERMAL | Status: DC | PRN
  Administered 2016-02-26: 12.5 ug via TRANSDERMAL
  Filled 2016-02-26: qty 1

## 2016-02-26 MED ORDER — SODIUM CHLORIDE 0.9 % IV SOLN
INTRAVENOUS | Status: DC
  Administered 2016-02-26: 20:00:00 via INTRAVENOUS

## 2016-02-26 MED ORDER — MIDAZOLAM HCL 2 MG/2ML IJ SOLN
INTRAMUSCULAR | Status: AC
  Filled 2016-02-26: qty 2

## 2016-02-26 MED ORDER — METHOCARBAMOL 500 MG PO TABS: 500.0000 mg | ORAL_TABLET | Freq: Four times a day (QID) | ORAL | Status: DC | PRN

## 2016-02-26 MED ORDER — FENTANYL CITRATE (PF) 100 MCG/2ML IJ SOLN
INTRAMUSCULAR | Status: AC
  Filled 2016-02-26: qty 2

## 2016-02-26 MED ORDER — ONDANSETRON HCL 4 MG PO TABS: 4.0000 mg | ORAL_TABLET | Freq: Four times a day (QID) | ORAL | Status: DC | PRN

## 2016-02-26 MED ORDER — TAPENTADOL HCL 50 MG PO TABS: 25.0000 mg | ORAL_TABLET | Freq: Every day | ORAL | Status: DC | PRN

## 2016-02-26 MED ORDER — DULOXETINE HCL 60 MG PO CPEP: 60.0000 mg | ORAL_CAPSULE | Freq: Every day | ORAL | Status: DC

## 2016-02-26 MED ORDER — METHOCARBAMOL 1000 MG/10ML IJ SOLN: 500.0000 mg | Freq: Four times a day (QID) | INTRAVENOUS | Status: DC | PRN

## 2016-02-26 MED ORDER — LIDOCAINE 2% (20 MG/ML) 5 ML SYRINGE
INTRAMUSCULAR | Status: AC
  Filled 2016-02-26: qty 5

## 2016-02-26 MED ORDER — KETOROLAC TROMETHAMINE 15 MG/ML IJ SOLN
15.0000 mg | Freq: Four times a day (QID) | INTRAMUSCULAR | Status: DC
  Administered 2016-02-27 (×2): 15 mg via INTRAVENOUS
  Filled 2016-02-26 (×2): qty 1

## 2016-02-26 MED ORDER — METOCLOPRAMIDE HCL 5 MG PO TABS: 5.0000 mg | ORAL_TABLET | Freq: Three times a day (TID) | ORAL | Status: DC | PRN

## 2016-02-26 MED ORDER — FENTANYL 12 MCG/HR TD PT72: 12.5000 ug | MEDICATED_PATCH | TRANSDERMAL | 0 refills | Status: DC | PRN

## 2016-02-26 MED ORDER — ACETAMINOPHEN 650 MG RE SUPP: 650.0000 mg | Freq: Four times a day (QID) | RECTAL | Status: DC | PRN

## 2016-02-26 MED ORDER — ACETAMINOPHEN 325 MG PO TABS: 650.0000 mg | ORAL_TABLET | Freq: Four times a day (QID) | ORAL | Status: DC | PRN

## 2016-02-26 MED ORDER — ONDANSETRON HCL 4 MG/2ML IJ SOLN: 4.0000 mg | Freq: Four times a day (QID) | INTRAMUSCULAR | Status: DC | PRN

## 2016-02-26 MED ORDER — DULOXETINE HCL 60 MG PO CPEP: 90.0000 mg | ORAL_CAPSULE | Freq: Every day | ORAL | Status: DC

## 2016-02-26 MED ORDER — ASPIRIN EC 325 MG PO TBEC: 325.0000 mg | DELAYED_RELEASE_TABLET | Freq: Every day | ORAL | Status: DC

## 2016-02-26 MED ORDER — METOCLOPRAMIDE HCL 5 MG/ML IJ SOLN: 5.0000 mg | Freq: Three times a day (TID) | INTRAMUSCULAR | Status: DC | PRN

## 2016-02-26 MED ORDER — PHENYLEPHRINE 40 MCG/ML (10ML) SYRINGE FOR IV PUSH (FOR BLOOD PRESSURE SUPPORT)
PREFILLED_SYRINGE | INTRAVENOUS | Status: AC
  Filled 2016-02-26: qty 10

## 2016-02-26 MED ORDER — MIDAZOLAM HCL 2 MG/2ML IJ SOLN
2.0000 mg | Freq: Once | INTRAMUSCULAR | Status: AC
  Administered 2016-02-26: 2 mg via INTRAVENOUS

## 2016-02-26 MED ORDER — ALBUTEROL SULFATE (2.5 MG/3ML) 0.083% IN NEBU: 3.0000 mL | INHALATION_SOLUTION | Freq: Four times a day (QID) | RESPIRATORY_TRACT | Status: DC | PRN

## 2016-02-26 MED ORDER — SCOPOLAMINE 1 MG/3DAYS TD PT72
1.0000 | MEDICATED_PATCH | TRANSDERMAL | Status: DC
  Administered 2016-02-26: 1.5 mg via TRANSDERMAL

## 2016-02-26 MED ORDER — PANTOPRAZOLE SODIUM 40 MG PO TBEC: 40.0000 mg | DELAYED_RELEASE_TABLET | Freq: Every day | ORAL | Status: DC

## 2016-02-26 MED ORDER — CEFAZOLIN SODIUM-DEXTROSE 2-4 GM/100ML-% IV SOLN
2.0000 g | INTRAVENOUS | Status: AC
  Administered 2016-02-26: 2 g via INTRAVENOUS
  Filled 2016-02-26: qty 100

## 2016-02-26 MED ORDER — SUMATRIPTAN SUCCINATE 50 MG PO TABS: 50.0000 mg | ORAL_TABLET | Freq: Once | ORAL | Status: DC

## 2016-02-26 MED ORDER — FENTANYL CITRATE (PF) 100 MCG/2ML IJ SOLN: 25.0000 ug | INTRAMUSCULAR | Status: DC | PRN

## 2016-02-26 MED ORDER — PROPOFOL 500 MG/50ML IV EMUL
INTRAVENOUS | Status: DC | PRN
  Administered 2016-02-26: 50 ug/kg/min via INTRAVENOUS

## 2016-02-26 MED ORDER — 0.9 % SODIUM CHLORIDE (POUR BTL) OPTIME
TOPICAL | Status: DC | PRN
  Administered 2016-02-26: 1000 mL

## 2016-02-26 MED ORDER — CHLORHEXIDINE GLUCONATE 4 % EX LIQD: 60.0000 mL | Freq: Once | CUTANEOUS | Status: DC

## 2016-02-26 MED ORDER — METOCLOPRAMIDE HCL 5 MG PO TABS
5.0000 mg | ORAL_TABLET | Freq: Three times a day (TID) | ORAL | Status: DC | PRN
Start: 2016-02-26 — End: 2016-02-27

## 2016-02-26 MED ORDER — LACTATED RINGERS IV SOLN
INTRAVENOUS | Status: DC
  Administered 2016-02-26 (×2): via INTRAVENOUS

## 2016-02-26 MED ORDER — LEVOTHYROXINE SODIUM 112 MCG PO TABS: 112.0000 ug | ORAL_TABLET | ORAL | Status: DC

## 2016-02-26 MED ORDER — DULOXETINE HCL 30 MG PO CPEP: 30.0000 mg | ORAL_CAPSULE | Freq: Every day | ORAL | Status: DC

## 2016-02-26 MED ORDER — CEFAZOLIN IN D5W 1 GM/50ML IV SOLN
1.0000 g | Freq: Four times a day (QID) | INTRAVENOUS | Status: AC
  Administered 2016-02-26 – 2016-02-27 (×3): 1 g via INTRAVENOUS
  Filled 2016-02-26 (×3): qty 50

## 2016-02-26 MED ORDER — PROPOFOL 10 MG/ML IV BOLUS
INTRAVENOUS | Status: DC | PRN
  Administered 2016-02-26: 60 mg via INTRAVENOUS

## 2016-02-26 MED ORDER — BUPIVACAINE-EPINEPHRINE (PF) 0.5% -1:200000 IJ SOLN
INTRAMUSCULAR | Status: DC | PRN
  Administered 2016-02-26: 30 mL via PERINEURAL

## 2016-02-26 MED ORDER — SCOPOLAMINE 1 MG/3DAYS TD PT72
MEDICATED_PATCH | TRANSDERMAL | Status: AC
  Filled 2016-02-26: qty 1

## 2016-02-26 MED ORDER — HYDROMORPHONE HCL 2 MG/ML IJ SOLN
1.0000 mg | INTRAMUSCULAR | Status: DC | PRN
  Filled 2016-02-26: qty 1

## 2016-02-26 MED ORDER — FENTANYL 25 MCG/HR TD PT72: 25.0000 ug | MEDICATED_PATCH | TRANSDERMAL | 0 refills | Status: DC

## 2016-02-26 MED ORDER — SODIUM CHLORIDE 0.9 % IV SOLN: INTRAVENOUS | Status: DC

## 2016-02-26 MED ORDER — AMPHETAMINE-DEXTROAMPHET ER 10 MG PO CP24: 20.0000 mg | ORAL_CAPSULE | Freq: Every day | ORAL | Status: DC

## 2016-02-26 MED ORDER — METHOCARBAMOL 500 MG PO TABS: 500.0000 mg | ORAL_TABLET | Freq: Three times a day (TID) | ORAL | Status: DC | PRN

## 2016-02-26 MED ORDER — METHOCARBAMOL 1000 MG/10ML IJ SOLN
500.0000 mg | Freq: Four times a day (QID) | INTRAVENOUS | Status: DC | PRN
  Filled 2016-02-26: qty 5

## 2016-02-26 MED ORDER — SUMATRIPTAN SUCCINATE 50 MG PO TABS
50.0000 mg | ORAL_TABLET | Freq: Every day | ORAL | Status: DC | PRN
  Filled 2016-02-26: qty 1

## 2016-02-26 SURGICAL SUPPLY — 43 items
BNDG COHESIVE 1X5 TAN STRL LF (GAUZE/BANDAGES/DRESSINGS) IMPLANT
BNDG COHESIVE 4X5 TAN STRL (GAUZE/BANDAGES/DRESSINGS) ×2 IMPLANT
BNDG COHESIVE 6X5 TAN STRL LF (GAUZE/BANDAGES/DRESSINGS) IMPLANT
BNDG ESMARK 4X9 LF (GAUZE/BANDAGES/DRESSINGS) ×2 IMPLANT
BNDG GAUZE ELAST 4 BULKY (GAUZE/BANDAGES/DRESSINGS) ×2 IMPLANT
BNDG GAUZE STRTCH 6 (GAUZE/BANDAGES/DRESSINGS) IMPLANT
CORDS BIPOLAR (ELECTRODE) IMPLANT
COTTON STERILE ROLL (GAUZE/BANDAGES/DRESSINGS) IMPLANT
COVER SURGICAL LIGHT HANDLE (MISCELLANEOUS) ×4 IMPLANT
DRAPE OEC MINIVIEW 54X84 (DRAPES) ×2 IMPLANT
DRAPE U-SHAPE 47X51 STRL (DRAPES) ×2 IMPLANT
DRSG ADAPTIC 3X8 NADH LF (GAUZE/BANDAGES/DRESSINGS) IMPLANT
DRSG EMULSION OIL 3X3 NADH (GAUZE/BANDAGES/DRESSINGS) ×2 IMPLANT
DURAPREP 26ML APPLICATOR (WOUND CARE) ×2 IMPLANT
ELECT REM PT RETURN 9FT ADLT (ELECTROSURGICAL) ×2
ELECTRODE REM PT RTRN 9FT ADLT (ELECTROSURGICAL) ×1 IMPLANT
GAUZE SPONGE 2X2 8PLY STRL LF (GAUZE/BANDAGES/DRESSINGS) IMPLANT
GAUZE SPONGE 4X4 12PLY STRL (GAUZE/BANDAGES/DRESSINGS) ×2 IMPLANT
GLOVE BIOGEL PI IND STRL 9 (GLOVE) ×1 IMPLANT
GLOVE BIOGEL PI INDICATOR 9 (GLOVE) ×1
GLOVE SURG ORTHO 9.0 STRL STRW (GLOVE) ×4 IMPLANT
GLOVE SURG SS PI 8.0 STRL IVOR (GLOVE) ×4 IMPLANT
GOWN STRL REUS W/ TWL XL LVL3 (GOWN DISPOSABLE) ×1 IMPLANT
GOWN STRL REUS W/TWL XL LVL3 (GOWN DISPOSABLE) ×1
K-WIRE 1.1 (WIRE) ×1
K-WIRE FX150X1.1XTROC TIP (WIRE) ×1
KIT BASIN OR (CUSTOM PROCEDURE TRAY) ×2 IMPLANT
KIT ROOM TURNOVER OR (KITS) ×2 IMPLANT
KWIRE FX150X1.1XTROC TIP (WIRE) ×1 IMPLANT
MANIFOLD NEPTUNE II (INSTRUMENTS) ×2 IMPLANT
NS IRRIG 1000ML POUR BTL (IV SOLUTION) ×2 IMPLANT
PACK ORTHO EXTREMITY (CUSTOM PROCEDURE TRAY) ×2 IMPLANT
PAD ARMBOARD 7.5X6 YLW CONV (MISCELLANEOUS) ×2 IMPLANT
PAD CAST 4YDX4 CTTN HI CHSV (CAST SUPPLIES) IMPLANT
PADDING CAST COTTON 4X4 STRL (CAST SUPPLIES)
SPONGE GAUZE 2X2 STER 10/PKG (GAUZE/BANDAGES/DRESSINGS)
SUCTION FRAZIER HANDLE 10FR (MISCELLANEOUS)
SUCTION TUBE FRAZIER 10FR DISP (MISCELLANEOUS) IMPLANT
SUT ETHILON 2 0 PSLX (SUTURE) IMPLANT
SUT ETHILON 3 0 PS 1 (SUTURE) ×2 IMPLANT
SUT VIC AB 2-0 CT1 27 (SUTURE)
SUT VIC AB 2-0 CT1 TAPERPNT 27 (SUTURE) IMPLANT
TOWEL OR 17X26 10 PK STRL BLUE (TOWEL DISPOSABLE) ×2 IMPLANT

## 2016-02-26 NOTE — Anesthesia Preprocedure Evaluation (Signed)
Anesthesia Evaluation  Patient identified by MRN, date of birth, ID band Patient awake    Reviewed: Allergy & Precautions, H&P , NPO status , Patient's Chart, lab work & pertinent test results  History of Anesthesia Complications (+) PONV  Airway Mallampati: II  TM Distance: >3 FB Neck ROM: Full    Dental no notable dental hx. (+) Teeth Intact, Dental Advisory Given   Pulmonary neg pulmonary ROS,    Pulmonary exam normal breath sounds clear to auscultation       Cardiovascular hypertension,  Rhythm:Regular Rate:Normal     Neuro/Psych  Headaches, Depression negative psych ROS   GI/Hepatic Neg liver ROS, GERD  Medicated and Controlled,  Endo/Other  Hypothyroidism   Renal/GU negative Renal ROS  negative genitourinary   Musculoskeletal  (+) Arthritis , Osteoarthritis,  Fibromyalgia -  Abdominal   Peds  Hematology negative hematology ROS (+)   Anesthesia Other Findings   Reproductive/Obstetrics negative OB ROS                             Anesthesia Physical Anesthesia Plan  ASA: II  Anesthesia Plan: MAC and Regional   Post-op Pain Management:    Induction: Intravenous  Airway Management Planned: Simple Face Mask  Additional Equipment:   Intra-op Plan:   Post-operative Plan:   Informed Consent: I have reviewed the patients History and Physical, chart, labs and discussed the procedure including the risks, benefits and alternatives for the proposed anesthesia with the patient or authorized representative who has indicated his/her understanding and acceptance.   Dental advisory given  Plan Discussed with: CRNA  Anesthesia Plan Comments:         Anesthesia Quick Evaluation

## 2016-02-26 NOTE — Discharge Summary (Signed)
Discharge Diagnoses:  Active Problems:   Closed displaced fracture of middle phalanx of lesser toe of left foot   Surgeries: Procedure(s): LEFT 5TH TOE DISTAL INTERPHALANGEAL JOINT FUSION on 02/26/2016    Consultants:  none  Discharged Condition: Improved  Hospital Course: Angel Sellers is an 56 y.o. female who was admitted 02/26/2016 with a chief complaint of nonunion DIP fracture left little toe. 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  Patient was brought to the operating room on 02/26/2016 and underwent Procedure(s): LEFT 5TH TOE DISTAL INTERPHALANGEAL JOINT FUSION.    Patient was given perioperative antibiotics: Anti-infectives    Start     Dose/Rate Route Frequency Ordered Stop   02/26/16 0951  ceFAZolin (ANCEF) IVPB 2g/100 mL premix     2 g 200 mL/hr over 30 Minutes Intravenous On call to O.R. 02/26/16 [id number] 02/26/16 1453    .  Patient was given sequential compression devices, early ambulation, and aspirin for DVT prophylaxis.  Recent vital signs: Patient Vitals for the past 24 hrs:  BP Temp Temp src Pulse Resp SpO2  02/26/16 1602 123/85 - - 90 (!) 21 100 %  02/26/16 1530 120/81 98 F (36.7 C) - - - -  02/26/16 1115 140/81 - - 86 12 100 %  02/26/16 1110 (!) 147/108 - - 94 20 100 %  02/26/16 1105 (!) 162/95 - - 89 14 100 %  02/26/16 1100 (!) 161/87 - - 97 12 100 %  02/26/16 0949 (!) 153/91 98.9 F (37.2 C) Oral 95 20 97 %  .  Recent laboratory studies: No results found.  Discharge Medications:   Allergies as of 02/26/2016      Reactions   Dilaudid [hydromorphone Hcl] Other (See Comments)   hypotension   Lactose Intolerance (gi) Other (See Comments)   GI upset   Meperidine Nausea And Vomiting   Morphine Hives, Rash      Medication List    TAKE these medications   albuterol 108 (90 Base) MCG/ACT inhaler Commonly known as:  PROVENTIL  HFA;VENTOLIN HFA Inhale 2 puffs into the lungs every 6 (six) hours as needed for wheezing or shortness of breath.   amphetamine-dextroamphetamine 20 MG 24 hr capsule Commonly known as:  ADDERALL XR Take 20 mg by mouth daily.   DULoxetine 30 MG capsule Commonly known as:  CYMBALTA Take 30 mg by mouth daily.   DULoxetine 60 MG capsule Commonly known as:  CYMBALTA Take 60 mg by mouth daily.   fentaNYL 12 MCG/HR Commonly known as:  DURAGESIC - dosed mcg/hr Place 12.5 mcg onto the skin every three (3) days as needed (for pain resulting from broken bones.). What changed:  Another medication with the same name was added. Make sure you understand how and when to take each.   fentaNYL 25 MCG/HR patch Commonly known as:  DURAGESIC Place 1 patch (25 mcg total) onto the skin every 3 (three) days. What changed:  You were already taking a medication with the same name, and this prescription was added. Make sure you understand how and when to take each.   fentaNYL 12 MCG/HR Commonly known as:  DURAGESIC - dosed mcg/hr Place 1 patch (12.5 mcg total) onto the skin every three (3) days as needed (for pain resulting from broken bones.). What changed:  You were already taking a medication with the same name, and this prescription was  added. Make sure you understand how and when to take each.   levothyroxine 125 MCG tablet Commonly known as:  SYNTHROID, LEVOTHROID Take 125 mcg by mouth every other day.   levothyroxine 112 MCG tablet Commonly known as:  SYNTHROID, LEVOTHROID Take 112 mcg by mouth every other day.   olmesartan 5 MG tablet Commonly known as:  BENICAR Take 5 mg by mouth daily.   omeprazole 40 MG capsule Commonly known as:  PRILOSEC Take 40 mg by mouth daily.   OVER THE COUNTER MEDICATION Take 1 capsule by mouth daily. Cysta-Q (Proprietary Blend)  Quercetin/bromelain/papin/cranberry fruit/passion flower herb/valerian root/wood betony leaf   promethazine 25 MG tablet Commonly  known as:  PHENERGAN Take 25 mg by mouth every 6 (six) hours as needed (for nausea/vomiting with migraines.).   rizatriptan 10 MG tablet Commonly known as:  MAXALT Take 10 mg by mouth daily as needed for migraine. May repeat in 2 hours if needed   ROBAXIN 500 MG tablet Generic drug:  methocarbamol Take 500 mg by mouth 3 (three) times daily as needed (for muscle spasm/pain.).   tapentadol 50 MG tablet Commonly known as:  NUCYNTA Take 25 mg by mouth daily as needed (for pain due to exertion/falls).   Vitamin D 2000 units tablet Take 2,000 Units by mouth daily. What changed:  Another medication with the same name was removed. Continue taking this medication, and follow the directions you see here.       Diagnostic Studies: Xr Toe 5th Left  Result Date: 01/28/2016 Findings: 3V left fifth toe.  Oblique fracture involving approximately 50% of the articular surface of the IP joint. 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     Disposition: 01-Home or Self Care Discharge Instructions    Call MD / Call 911    Complete by:  As directed    If you experience chest pain or shortness of breath, CALL 911 and be transported to the hospital emergency room.  If you develope a fever above 101 F, pus (white drainage) or increased drainage or redness at the wound, or calf pain, call your surgeon's office.   Call MD / Call 911    Complete by:  As directed    If you experience chest pain or shortness of breath, CALL 911 and be transported to the hospital emergency room.  If you develope a fever above 101 F, pus (white drainage) or increased drainage or redness at the wound, or calf pain, call your surgeon's office.   Constipation  Prevention    Complete by:  As directed    Drink plenty of fluids.  Prune juice may be helpful.  You may use a stool softener, such as Colace (over the counter) 100 mg twice a day.  Use MiraLax (over the counter) for constipation as needed.   Constipation Prevention    Complete by:  As directed    Drink plenty of fluids.  Prune juice may be helpful.  You may use a stool softener, such as Colace (over the counter) 100 mg twice a day.  Use MiraLax (over the counter) for constipation as needed.   Diet - low sodium heart healthy    Complete by:  As directed    Diet - low sodium heart healthy  Complete by:  As directed    Elevate operative extremity    Complete by:  As directed    Ice pack    Complete by:  As directed    Increase activity slowly as tolerated    Complete by:  As directed    Increase activity slowly as tolerated    Complete by:  As directed    Post-op shoe    Complete by:  As directed    Touch down weight bearing    Complete by:  As directed    Laterality:  left   Extremity:  Lower     Follow-up Information    Newt Minion, MD In 2 weeks.   Specialty:  Orthopedic Surgery Contact information: [address] [place] [address] [phone]            Signed: Newt Minion 02/26/2016, 4:57 PM

## 2016-02-26 NOTE — Transfer of Care (Signed)
Immediate Anesthesia Transfer of Care Note  Patient: Angel Sellers  Procedure(s) Performed: Procedure(s): LEFT 5TH TOE DISTAL INTERPHALANGEAL JOINT FUSION (Left)  Patient Location: PACU  Anesthesia Type:MAC and MAC combined with regional for post-op pain  Level of Consciousness: awake, alert , oriented and patient cooperative  Airway & Oxygen Therapy: Patient Spontanous Breathing  Post-op Assessment: Report given to RN and Post -op Vital signs reviewed and stable  Post vital signs: Reviewed and stable  Last Vitals:  Vitals:   02/26/16 1110 02/26/16 1115  BP: (!) 147/108 140/81  Pulse: 94 86  Resp: 20 12  Temp:      Last Pain:  Vitals:   02/26/16 1007  TempSrc:   PainSc: 7       Patients Stated Pain Goal: 3 (02/77/41 2878)  Complications: No apparent anesthesia complications

## 2016-02-26 NOTE — Anesthesia Procedure Notes (Signed)
Procedure Name: MAC Date/Time: 02/26/2016 2:51 PM Performed by: Lance Coon Pre-anesthesia Checklist: Patient identified, Emergency Drugs available, Suction available, Patient being monitored and Timeout performed Patient Re-evaluated:Patient Re-evaluated prior to inductionOxygen Delivery Method: Simple face mask

## 2016-02-26 NOTE — Progress Notes (Signed)
Orthopedic Tech Progress Note Patient Details:  Angel Sellers 05/06/1960 [id number]  Ortho Devices Type of Ortho Device: Postop shoe/boot Ortho Device/Splint Location: Applied post op shoe to Left foot. Pt tolerated well.  Ortho Device/Splint Interventions: Application, Adjustment   Kristopher Oppenheim 02/26/2016, 4:14 PM

## 2016-02-26 NOTE — Anesthesia Postprocedure Evaluation (Signed)
Anesthesia Post Note  Patient: Mekenna A Masoud  Procedure(s) Performed: Procedure(s) (LRB): LEFT 5TH TOE DISTAL INTERPHALANGEAL JOINT FUSION (Left)  Patient location during evaluation: PACU Anesthesia Type: Regional and MAC Level of consciousness: awake and alert Pain management: pain level controlled Vital Signs Assessment: post-procedure vital signs reviewed and stable Respiratory status: spontaneous breathing, nonlabored ventilation and respiratory function stable Cardiovascular status: stable and blood pressure returned to baseline Anesthetic complications: no       Last Vitals:  Vitals:   02/26/16 1530 02/26/16 1602  BP: 120/81 123/85  Pulse:  90  Resp:  (!) 21  Temp: 36.7 C     Last Pain:  Vitals:   02/26/16 1007  TempSrc:   PainSc: 7                  Harley Fitzwater,W. EDMOND

## 2016-02-26 NOTE — H&P (Signed)
Angel Sellers is an 56 y.o. female.   Chief Complaint: Left little toe pain. HPI: Patient is status post intra-articular fracture of the left little toe DIP joint. Patient has failed conservative therapy and wished to proceed with fusion of the joint.  Past Medical History:  Diagnosis Date  . Arthritis   . Cancer (HCC)    renal  . Chronic fatigue   . Depression   . Fibromyalgia   . GERD (gastroesophageal reflux disease)   . Headache   . History of kidney cancer   . Hypertension   . Hypothyroidism   . Interstitial cystitis   . PONV (postoperative nausea and vomiting)    need scope patch  . POTS (postural orthostatic tachycardia syndrome)   . Thyroid disease     Past Surgical History:  Procedure Laterality Date  . ABDOMINAL HYSTERECTOMY  1999  . BONE BIOPSY Right 05/01/2013   Procedure: BIOPSY NAIL BED RIGHT THUMB, BIOPSY RIGHT GREAT TOE ;  Surgeon: Wynonia Sours, MD;  Location: [facility];  Service: Orthopedics;  Laterality: Right;  . CESAREAN SECTION    . COLONOSCOPY    . CYSTOSCOPY    . KIDNEY SURGERY  2011   rt partial nephrectomy-[facility]  . TONSILLECTOMY      Family History  Problem Relation Age of Onset  . Melanoma Mother    Social History:  reports that she has never smoked. She has never used smokeless tobacco. She reports that she does not drink alcohol or use drugs.  Allergies:  Allergies  Allergen Reactions  . Dilaudid [Hydromorphone Hcl] Other (See Comments)    hypotension  . Lactose Intolerance (Gi) Other (See Comments)    GI upset  . Meperidine Nausea And Vomiting  . Morphine Hives and Rash    No prescriptions prior to admission.    No results found for this or any previous visit (from the past 48 hour(s)). No results found.  Review of Systems  All other systems reviewed and are negative.   There were no vitals taken for this visit. Physical Exam  On examination patient is alert oriented no adenopathy well-dressed normal affect  normal respiratory effort she does have an antalgic gait she has good pulses she has pain and swelling in the left little toe she has pain to palpation there is no redness no cellulitis no redness orevidence of infection. 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  Plan: We'll plan for fusion of the DIP joint risk and benefits were discussed including infection neurovascular injury pain nonunion nail deformity. Patient states she understands wish to proceed at this time.  Newt Minion, MD 02/26/2016, 7:05 AM

## 2016-02-26 NOTE — Progress Notes (Signed)
After some discussion between spouse/Dr Sharol Given, now being admitted for observation. IV is out / off monitor and awaiting transfer to floor

## 2016-02-26 NOTE — Op Note (Signed)
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    The risks benefits and alternatives were discussed with the patient preoperatively including but not limited to the risks of infection, bleeding, nerve injury, cardiopulmonary complications, the need for revision surgery, among others, and the patient was willing to proceed.  OPERATIVE IMPLANTS: 4.5 mm wire  OPERATIVE FINDINGS: Nonunion DIP joint left little toe  OPERATIVE PROCEDURE: Patient brought the operating room and underwent a regional anesthetic. After adequate levels anesthesia obtained patient's left lower extremity was prepped using DuraPrep draped into a sterile field a timeout was called. A dorsal incision was made over the DIP joint left little toe. The extensor tendon was split and retracted. Patient had a nonunion of the middle phalanx DIP joint fracture. The nonunion of the condyles was removed the base of the distal phalanx was removed the joint was curetted back to bleeding viable subchondral bone. A K wire was then retrograded inserted into the distal phalanx proximal to distal and then reversed into the middle phalanx. C-arm fluoroscopy verified alignment in both AP and lateral planes a pin cap was applied the incision was irrigated and closed using 3-0 nylon. A sterile compressive dressing was applied patient was taken to PACU in stable condition.

## 2016-02-26 NOTE — Anesthesia Procedure Notes (Addendum)
Anesthesia Regional Block:  Popliteal block  Pre-Anesthetic Checklist: ,, timeout performed, Correct Patient, Correct Site, Correct Laterality, Correct Procedure, Correct Position, site marked, Risks and benefits discussed, pre-op evaluation,  At surgeon's request and post-op pain management  Laterality: Left  Prep: Maximum Sterile Barrier Precautions used, chloraprep       Needles:  Injection technique: Single-shot  Needle Type: Echogenic Stimulator Needle     Needle Length: 9cm 9 cm Needle Gauge: 21 and 21 G    Additional Needles:  Procedures: ultrasound guided (picture in chart) and nerve stimulator Popliteal block  Nerve Stimulator or Paresthesia:  Response: Peroneal,  Response: Tibial,   Additional Responses:   Narrative:  Start time: 02/26/2016 11:05 AM End time: 02/26/2016 11:15 AM Injection made incrementally with aspirations every 5 mL. Anesthesiologist: Roderic Palau  Additional Notes: 2% Lidocaine skin wheel.

## 2016-02-27 ENCOUNTER — Encounter (HOSPITAL_COMMUNITY): Payer: Self-pay | Admitting: Orthopedic Surgery

## 2016-02-27 DIAGNOSIS — K219 Gastro-esophageal reflux disease without esophagitis: Secondary | ICD-10-CM | POA: Diagnosis not present

## 2016-02-27 DIAGNOSIS — E738 Other lactose intolerance: Secondary | ICD-10-CM | POA: Diagnosis not present

## 2016-02-27 DIAGNOSIS — F329 Major depressive disorder, single episode, unspecified: Secondary | ICD-10-CM | POA: Diagnosis not present

## 2016-02-27 DIAGNOSIS — M797 Fibromyalgia: Secondary | ICD-10-CM | POA: Diagnosis not present

## 2016-02-27 DIAGNOSIS — I10 Essential (primary) hypertension: Secondary | ICD-10-CM | POA: Diagnosis not present

## 2016-02-27 DIAGNOSIS — S92592K Other fracture of left lesser toe(s), subsequent encounter for fracture with nonunion: Secondary | ICD-10-CM | POA: Diagnosis not present

## 2016-02-27 NOTE — Care Management Note (Signed)
57 yo F with a Non Union Left 5th Toe Fx, s/p L 5th toe distal interphalangeal joint fusion  Received referral to assist pt with HHPT and DME.  Pt declined PT eval. She reports that she is doing well and has DME.  Met with pt at bedside. She plans to return home with the support of her husband. She has a knee scooter, bath chair, walker, and a wheelchair.  She denies any d/c needs.

## 2016-02-27 NOTE — Progress Notes (Signed)
PT Cancellation Note  Patient Details Name: GERILYN LINLEY MRN: [id number] DOB: August 01, 1960   Cancelled Treatment:    Reason Eval/Treat Not Completed: Patient declined, reports that she only has her knee scooter and has been doing well since before surgery. Pt would like to defer PT evaluation. All equipment is in place and questions were answered. Please re-order if needed in the future.    Scheryl Marten PT, DPT  [phone]  02/27/2016, 10:14 AM

## 2016-02-27 NOTE — Progress Notes (Signed)
Subjective: Patient stable.  Pain controlled.  Left foot in hard sole shoe with pin in small toe   Objective: Vital signs in last 24 hours: Temp:  [98 F (36.7 C)-99 F (37.2 C)] 98 F (36.7 C) (01/13 0459) Pulse Rate:  [74-97] 74 (01/13 0459) Resp:  [12-21] 20 (01/12 1700) BP: (110-162)/(59-108) 110/59 (01/13 0459) SpO2:  [96 %-100 %] 96 % (01/13 0459)  Intake/Output from previous day: 01/12 0701 - 01/13 0700 In: 1100 [I.V.:1000; IV Piggyback:100] Out: 10 [Blood:10] Intake/Output this shift: No intake/output data recorded.  Exam:  No cellulitis present  Labs: No results for input(s): HGB in the last 72 hours. No results for input(s): WBC, RBC, HCT, PLT in the last 72 hours. No results for input(s): NA, K, CL, CO2, BUN, CREATININE, GLUCOSE, CALCIUM in the last 72 hours. No results for input(s): LABPT, INR in the last 72 hours.  Assessment/Plan: Plan discharge today   Anderson Malta 02/27/2016, 9:19 AM

## 2016-03-02 ENCOUNTER — Other Ambulatory Visit (INDEPENDENT_AMBULATORY_CARE_PROVIDER_SITE_OTHER): Payer: Self-pay | Admitting: Specialist

## 2016-03-02 MED ORDER — METHYLPREDNISOLONE 4 MG PO TBPK: ORAL_TABLET | ORAL | 0 refills | Status: DC

## 2016-03-02 NOTE — Progress Notes (Unsigned)
Patient called in complaining of LBP with radiation into her leg, she is intolerant of narcotic medications, office is closed due to inclement weather. She has not had sciatica in the past. Underwent recent toe fusion surgery by Dr. Sharol Given 5 days ago with a popliteal block, has a history of partial nephrectomy for RCC. I called in a medrol dose pak to her pharmacy. Advised to go to ER if pain is not tolerable.

## 2016-03-07 NOTE — Progress Notes (Signed)
Pt has an appt on 03/09/16 for follow up.

## 2016-03-09 ENCOUNTER — Ambulatory Visit (INDEPENDENT_AMBULATORY_CARE_PROVIDER_SITE_OTHER): Payer: Self-pay | Admitting: Orthopedic Surgery

## 2016-03-09 VITALS — Ht 65.0 in | Wt 190.0 lb

## 2016-03-09 DIAGNOSIS — S92522P Displaced fracture of medial phalanx of left lesser toe(s), subsequent encounter for fracture with malunion: Secondary | ICD-10-CM

## 2016-03-09 NOTE — Progress Notes (Signed)
Office Visit Note   Patient: Angel Sellers           Date of Birth: 1960-02-18           MRN: [id number] Visit Date: 03/09/2016              Requested by: Lavone Orn, MD [address] [place], [place] [address] PCP: Irven Shelling, MD  Chief Complaint  Patient presents with  . Left Foot - Routine Post Op    02/26/16 LEFT 5TH TOE DISTAL INTERPHALANGEAL JOINT FUSION    HPI: Pt is non weight bearing with a knee walker. She is 1 week and 5 days s/p a left foot 5th toe fusion. She is in a fracture boot. Pamella Pert, RMA    Assessment & Plan: Visit Diagnoses:  1. Closed displaced fracture of middle phalanx of lesser toe of left foot with malunion, subsequent encounter     Plan: The pin was removed without complications the incision is clean and dry I will harvest the sutures. She'll continue with the postoperative shoe for 2 weeks use a Band-Aid to protect the toe follow-up in 2 weeks at which time we'll cancer to regular shoewear such as a wide new balance walking sneaker.  Follow-Up Instructions: Return in about 2 weeks (around 03/23/2016).   Ortho Exam On examination the incision is healed nicely there is no cellulitis no drainage no signs of infection. The toe is straight.  Imaging: No results found.  Orders:  No orders of the defined types were placed in this encounter.  No orders of the defined types were placed in this encounter.    Procedures: No procedures performed  Clinical Data: No additional findings.  Subjective: Review of Systems  Objective: Vital Signs: Ht 5\' 5"  (1.651 m)   Wt 190 lb (86.2 kg)   BMI 31.62 kg/m   Specialty Comments:  No specialty comments available.  PMFS History: Patient Active Problem List   Diagnosis Date Noted  . Fusion of toes 02/26/2016  . Closed displaced fracture of middle phalanx of lesser toe of left foot   . Thumbnail injury 04/10/2013  . MIGRAINE, CHRONIC 03/18/2008  . HYPERTENSION  03/18/2008  . COUGH 03/18/2008   Past Medical History:  Diagnosis Date  . Arthritis   . Cancer (HCC)    renal  . Chronic fatigue   . Depression   . Fibromyalgia   . GERD (gastroesophageal reflux disease)   . Headache   . History of kidney cancer   . Hypertension   . Hypothyroidism   . Interstitial cystitis   . PONV (postoperative nausea and vomiting)    need scope patch  . POTS (postural orthostatic tachycardia syndrome)   . Thyroid disease     Family History  Problem Relation Age of Onset  . Melanoma Mother     Past Surgical History:  Procedure Laterality Date  . ABDOMINAL HYSTERECTOMY  1999  . BONE BIOPSY Right 05/01/2013   Procedure: BIOPSY NAIL BED RIGHT THUMB, BIOPSY RIGHT GREAT TOE ;  Surgeon: Wynonia Sours, MD;  Location: [facility];  Service: Orthopedics;  Laterality: Right;  . CESAREAN SECTION    . COLONOSCOPY    . CYSTOSCOPY    . DISTAL INTERPHALANGEAL JOINT FUSION Left 02/26/2016   Procedure: LEFT 5TH TOE DISTAL INTERPHALANGEAL JOINT FUSION;  Surgeon: Newt Minion, MD;  Location: [facility];  Service: Orthopedics;  Laterality: Left;  . KIDNEY SURGERY  2011  rt partial nephrectomy-[facility]  . TONSILLECTOMY     Social History   Occupational History  . Not on file.   Social History Main Topics  . Smoking status: Never Smoker  . Smokeless tobacco: Never Used  . Alcohol use No  . Drug use: No  . Sexual activity: Not on file

## 2016-03-21 NOTE — Progress Notes (Signed)
CMP/CBC 02-19-16 epic     EKG 02-19-16 epic

## 2016-03-21 NOTE — Patient Instructions (Addendum)
Larrie A Piacente  03/21/2016   Your procedure is scheduled on: 03-31-16  Report to [facility] Main  Entrance take [facility]  elevators to 3rd floor to  [place] at 530AM.  Call this number if you have problems the morning of surgery [phone]   Remember: ONLY 1 PERSON MAY GO WITH YOU TO SHORT STAY TO GET  READY MORNING OF [place].  Do not eat food or drink liquids :After Midnight.     Take these medicines the morning of surgery with A SIP OF WATER: duloxetine(cymbalta). Levothyroxine(synthroid), omeprazole(prilosec),                                  You may not have any metal on your body including hair pins and              piercings  Do not wear jewelry, make-up, lotions, powders or perfumes, deodorant             Do not wear nail polish.  Do not shave  48 hours prior to surgery.              Men may shave face and neck.   Do not bring valuables to the hospital. [place].  Contacts, dentures or bridgework may not be worn into surgery.  Leave suitcase in the car. After surgery it may be brought to your room.              Please read over the following fact sheets you were given: _____________________________________________________________________             [facility] - Preparing for Surgery Before surgery, you can play an important role.  Because skin is not sterile, your skin needs to be as free of germs as possible.  You can reduce the number of germs on your skin by washing with CHG (chlorahexidine gluconate) soap before surgery.  CHG is an antiseptic cleaner which kills germs and bonds with the skin to continue killing germs even after washing. Please DO NOT use if you have an allergy to CHG or antibacterial soaps.  If your skin becomes reddened/irritated stop using the CHG and inform your nurse when you arrive at Short Stay. Do not shave (including legs and underarms) for at least 48 hours  prior to the first CHG shower.  You may shave your face/neck. Please follow these instructions carefully:  1.  Shower with CHG Soap the night before surgery and the  morning of Surgery.  2.  If you choose to wash your hair, wash your hair first as usual with your  normal  shampoo.  3.  After you shampoo, rinse your hair and body thoroughly to remove the  shampoo.                           4.  Use CHG as you would any other liquid soap.  You can apply chg directly  to the skin and wash                       Gently with a scrungie or clean washcloth.  5.  Apply the CHG Soap to  your body ONLY FROM THE NECK DOWN.   Do not use on face/ open                           Wound or open sores. Avoid contact with eyes, ears mouth and genitals (private parts).                       Wash face,  Genitals (private parts) with your normal soap.             6.  Wash thoroughly, paying special attention to the area where your surgery  will be performed.  7.  Thoroughly rinse your body with warm water from the neck down.  8.  DO NOT shower/wash with your normal soap after using and rinsing off  the CHG Soap.                9.  Pat yourself dry with a clean towel.            10.  Wear clean pajamas.            11.  Place clean sheets on your bed the night of your first shower and do not  sleep with pets. Day of Surgery : Do not apply any lotions/deodorants the morning of surgery.  Please wear clean clothes to the hospital/surgery center.  FAILURE TO FOLLOW THESE INSTRUCTIONS MAY RESULT IN THE CANCELLATION OF YOUR SURGERY PATIENT SIGNATURE_________________________________  NURSE SIGNATURE__________________________________  ________________________________________________________________________

## 2016-03-23 ENCOUNTER — Encounter (HOSPITAL_COMMUNITY)
Admission: RE | Admit: 2016-03-23 | Discharge: 2016-03-23 | Disposition: A | Discharge status: Home / Self Care | Payer: 59 | Source: Ambulatory Visit | Attending: General Surgery | Admitting: General Surgery

## 2016-03-23 ENCOUNTER — Encounter (HOSPITAL_COMMUNITY): Payer: Self-pay

## 2016-03-23 DIAGNOSIS — K432 Incisional hernia without obstruction or gangrene: Secondary | ICD-10-CM | POA: Insufficient documentation

## 2016-03-23 DIAGNOSIS — Z01812 Encounter for preprocedural laboratory examination: Secondary | ICD-10-CM | POA: Insufficient documentation

## 2016-03-23 HISTORY — DX: Myoneural disorder, unspecified: G70.9

## 2016-03-23 LAB — COMPREHENSIVE METABOLIC PANEL
ALK PHOS: 65 U/L (ref 38–126)
ALT: 23 U/L (ref 14–54)
AST: 24 U/L (ref 15–41)
Albumin: 4.4 g/dL (ref 3.5–5.0)
Anion gap: 8 (ref 5–15)
BUN: 15 mg/dL (ref 6–20)
CALCIUM: 9.4 mg/dL (ref 8.9–10.3)
CHLORIDE: 102 mmol/L (ref 101–111)
CO2: 28 mmol/L (ref 22–32)
CREATININE: 0.83 mg/dL (ref 0.44–1.00)
GFR calc Af Amer: >60 mL/min (ref >60)
Glucose, Bld: 102 mg/dL — ABNORMAL HIGH (ref 65–99)
Potassium: 3.8 mmol/L (ref 3.5–5.1)
Sodium: 138 mmol/L (ref 135–145)
Total Bilirubin: 0.6 mg/dL (ref 0.3–1.2)
Total Protein: 7.9 g/dL (ref 6.5–8.1)

## 2016-03-23 LAB — CBC WITH DIFFERENTIAL/PLATELET
BASOS ABS: 0.1 10*3/uL (ref 0.0–0.1)
Basophils Relative: 1 %
EOS PCT: 4 %
Eosinophils Absolute: 0.2 10*3/uL (ref 0.0–0.7)
HCT: 40.6 % (ref 36.0–46.0)
HEMOGLOBIN: 13.9 g/dL (ref 12.0–15.0)
LYMPHS ABS: 2.3 10*3/uL (ref 0.7–4.0)
LYMPHS PCT: 35 %
MCH: 29 pg (ref 26.0–34.0)
MCHC: 34.2 g/dL (ref 30.0–36.0)
MCV: 84.6 fL (ref 78.0–100.0)
Monocytes Absolute: 0.6 10*3/uL (ref 0.1–1.0)
Monocytes Relative: 9 %
NEUTROS PCT: 51 %
Neutro Abs: 3.3 10*3/uL (ref 1.7–7.7)
PLATELETS: 249 10*3/uL (ref 150–400)
RBC: 4.8 MIL/uL (ref 3.87–5.11)
RDW: 13.9 % (ref 11.5–15.5)
WBC: 6.4 10*3/uL (ref 4.0–10.5)

## 2016-03-24 ENCOUNTER — Ambulatory Visit (INDEPENDENT_AMBULATORY_CARE_PROVIDER_SITE_OTHER): Payer: Self-pay | Admitting: Orthopedic Surgery

## 2016-03-24 ENCOUNTER — Encounter (INDEPENDENT_AMBULATORY_CARE_PROVIDER_SITE_OTHER): Payer: Self-pay | Admitting: Orthopedic Surgery

## 2016-03-24 VITALS — Ht 65.0 in | Wt 192.0 lb

## 2016-03-24 DIAGNOSIS — Q7020 Fused toes, unspecified foot: Secondary | ICD-10-CM

## 2016-03-24 NOTE — Progress Notes (Signed)
Office Visit Note   Patient: Angel Sellers           Date of Birth: 05/11/1960           MRN: [id number] Visit Date: 03/24/2016              Requested by: Lavone Orn, MD [address] [place], [place] [address] PCP: Irven Shelling, MD  Chief Complaint  Patient presents with  . Left Foot - Routine Post Op    LEFT 5TH TOE DISTAL INTERPHALANGEAL JOINT FUSION 02/26/16 ~4 weeks post op    HPI: Patient is 56 y.o female who presents for follow up left foot 5th distal IP joint fusion. She is approximately 4 weeks post op. She has persistent swelling. She states she is doing well overall. She denies any pain, and feels she is improving daily. She is currently in post operative shoe wear. Maxcine Ham, RT    Assessment & Plan: Visit Diagnoses:  1. Fusion of toes     Plan: Follow-up as needed. She will advance to regular shoewear as. Patient states she feels 100% better and will follow up as needed. She discussed that she has on and off sciatic pain. Recommended that she follow-up with Dr. Ernestina Patches for evaluation for epidural steroid injection when she has a flareup from her sciatic symptoms.  Follow-Up Instructions: Return if symptoms worsen or fail to improve.   Ortho Exam Examination there is some swelling around the little toe incision is well-healed there is no cellulitis no drainage no signs of infection. Patient is asymptomatic at this time.  Imaging: No results found.  Orders:  No orders of the defined types were placed in this encounter.  No orders of the defined types were placed in this encounter.    Procedures: No procedures performed  Clinical Data: No additional findings.  Subjective: Review of Systems  Objective: Vital Signs: Ht 5\' 5"  (1.651 m)   Wt 192 lb (87.1 kg)   BMI 31.95 kg/m   Specialty Comments:  No specialty comments available.  PMFS History: Patient Active Problem List   Diagnosis Date Noted  . Fusion of toes  02/26/2016  . Closed displaced fracture of middle phalanx of lesser toe of left foot   . Thumbnail injury 04/10/2013  . MIGRAINE, CHRONIC 03/18/2008  . HYPERTENSION 03/18/2008  . COUGH 03/18/2008   Past Medical History:  Diagnosis Date  . Arthritis   . Cancer (HCC)    renal  . Chronic fatigue   . Fibromyalgia   . GERD (gastroesophageal reflux disease)   . Headache   . History of kidney cancer   . Hypertension   . Hypothyroidism   . Interstitial cystitis   . Neuromuscular disorder ([place])    nerve  . PONV (postoperative nausea and vomiting)    need scope patch  . POTS (postural orthostatic tachycardia syndrome)   . Thyroid disease     Family History  Problem Relation Age of Onset  . Melanoma Mother     Past Surgical History:  Procedure Laterality Date  . ABDOMINAL HYSTERECTOMY  1999  . BONE BIOPSY Right 05/01/2013   Procedure: BIOPSY NAIL BED RIGHT THUMB, BIOPSY RIGHT GREAT TOE ;  Surgeon: Wynonia Sours, MD;  Location: [place];  Service: Orthopedics;  Laterality: Right;  . CESAREAN SECTION     2 c-cestions  . COLONOSCOPY    . CYSTOSCOPY    . DISTAL INTERPHALANGEAL JOINT FUSION Left 02/26/2016  Procedure: LEFT 5TH TOE DISTAL INTERPHALANGEAL JOINT FUSION;  Surgeon: Newt Minion, MD;  Location: [place];  Service: Orthopedics;  Laterality: Left;  . KIDNEY SURGERY  2011   rt partial nephrectomy-[facility]  . TONSILLECTOMY     Social History   Occupational History  . Not on file.   Social History Main Topics  . Smoking status: Never Smoker  . Smokeless tobacco: Never Used  . Alcohol use No  . Drug use: No  . Sexual activity: Not on file

## 2016-03-30 NOTE — Anesthesia Preprocedure Evaluation (Addendum)
Anesthesia Evaluation  Patient identified by MRN, date of birth, ID band Patient awake    Reviewed: Allergy & Precautions, H&P , NPO status , Patient's Chart, lab work & pertinent test results  History of Anesthesia Complications (+) PONV  Airway Mallampati: II  TM Distance: >3 FB Neck ROM: Full    Dental no notable dental hx. (+) Teeth Intact, Dental Advisory Given   Pulmonary neg pulmonary ROS,    Pulmonary exam normal breath sounds clear to auscultation       Cardiovascular Exercise Tolerance: Good hypertension, Pt. on medications  Rhythm:Regular Rate:Normal     Neuro/Psych  Headaches, negative psych ROS   GI/Hepatic Neg liver ROS, GERD  Medicated and Controlled,  Endo/Other  Hypothyroidism   Renal/GU negative Renal ROS  negative genitourinary   Musculoskeletal  (+) Arthritis , Osteoarthritis,  Fibromyalgia -, narcotic dependent  Abdominal   Peds  Hematology negative hematology ROS (+)   Anesthesia Other Findings   Reproductive/Obstetrics negative OB ROS                           Anesthesia Physical Anesthesia Plan  ASA: II  Anesthesia Plan: General   Post-op Pain Management:    Induction: Intravenous  Airway Management Planned: Oral ETT  Additional Equipment:   Intra-op Plan:   Post-operative Plan: Extubation in OR  Informed Consent: I have reviewed the patients History and Physical, chart, labs and discussed the procedure including the risks, benefits and alternatives for the proposed anesthesia with the patient or authorized representative who has indicated his/her understanding and acceptance.   Dental advisory given  Plan Discussed with: CRNA  Anesthesia Plan Comments:         Anesthesia Quick Evaluation

## 2016-03-31 ENCOUNTER — Encounter (HOSPITAL_COMMUNITY)
Admission: RE | Disposition: A | Discharge status: Home / Self Care | Payer: Self-pay | Source: Ambulatory Visit | Attending: General Surgery

## 2016-03-31 ENCOUNTER — Ambulatory Visit (HOSPITAL_COMMUNITY): Payer: 59 | Admitting: Anesthesiology

## 2016-03-31 ENCOUNTER — Observation Stay (HOSPITAL_COMMUNITY)
Admission: RE | Admit: 2016-03-31 | Discharge: 2016-04-01 | Disposition: A | Discharge status: Home / Self Care | Payer: 59 | Source: Ambulatory Visit | Attending: General Surgery | Admitting: General Surgery

## 2016-03-31 ENCOUNTER — Encounter (HOSPITAL_COMMUNITY): Payer: Self-pay | Admitting: *Deleted

## 2016-03-31 DIAGNOSIS — M1990 Unspecified osteoarthritis, unspecified site: Secondary | ICD-10-CM | POA: Insufficient documentation

## 2016-03-31 DIAGNOSIS — K432 Incisional hernia without obstruction or gangrene: Principal | ICD-10-CM | POA: Insufficient documentation

## 2016-03-31 DIAGNOSIS — Z881 Allergy status to other antibiotic agents status: Secondary | ICD-10-CM | POA: Diagnosis not present

## 2016-03-31 DIAGNOSIS — R05 Cough: Secondary | ICD-10-CM | POA: Diagnosis not present

## 2016-03-31 DIAGNOSIS — K219 Gastro-esophageal reflux disease without esophagitis: Secondary | ICD-10-CM | POA: Insufficient documentation

## 2016-03-31 DIAGNOSIS — Z888 Allergy status to other drugs, medicaments and biological substances status: Secondary | ICD-10-CM | POA: Diagnosis not present

## 2016-03-31 DIAGNOSIS — I10 Essential (primary) hypertension: Secondary | ICD-10-CM | POA: Insufficient documentation

## 2016-03-31 DIAGNOSIS — M797 Fibromyalgia: Secondary | ICD-10-CM | POA: Insufficient documentation

## 2016-03-31 DIAGNOSIS — G709 Myoneural disorder, unspecified: Secondary | ICD-10-CM | POA: Insufficient documentation

## 2016-03-31 DIAGNOSIS — E039 Hypothyroidism, unspecified: Secondary | ICD-10-CM | POA: Insufficient documentation

## 2016-03-31 DIAGNOSIS — G43909 Migraine, unspecified, not intractable, without status migrainosus: Secondary | ICD-10-CM | POA: Diagnosis not present

## 2016-03-31 DIAGNOSIS — Z85528 Personal history of other malignant neoplasm of kidney: Secondary | ICD-10-CM | POA: Diagnosis not present

## 2016-03-31 DIAGNOSIS — G43709 Chronic migraine without aura, not intractable, without status migrainosus: Secondary | ICD-10-CM | POA: Diagnosis not present

## 2016-03-31 DIAGNOSIS — Z885 Allergy status to narcotic agent status: Secondary | ICD-10-CM | POA: Insufficient documentation

## 2016-03-31 DIAGNOSIS — E739 Lactose intolerance, unspecified: Secondary | ICD-10-CM | POA: Diagnosis not present

## 2016-03-31 HISTORY — PX: INCISIONAL HERNIA REPAIR: SHX193

## 2016-03-31 HISTORY — PX: INSERTION OF MESH: SHX5868

## 2016-03-31 SURGERY — REPAIR, HERNIA, INCISIONAL, LAPAROSCOPIC
Anesthesia: General | Site: Abdomen

## 2016-03-31 MED ORDER — KCL IN DEXTROSE-NACL 20-5-0.45 MEQ/L-%-% IV SOLN
INTRAVENOUS | Status: DC
  Administered 2016-03-31: 50 mL/h via INTRAVENOUS
  Filled 2016-03-31 (×2): qty 1000

## 2016-03-31 MED ORDER — ONDANSETRON HCL 4 MG/2ML IJ SOLN
INTRAMUSCULAR | Status: AC
  Filled 2016-03-31: qty 2

## 2016-03-31 MED ORDER — ONDANSETRON 4 MG PO TBDP: 4.0000 mg | ORAL_TABLET | Freq: Four times a day (QID) | ORAL | Status: DC | PRN

## 2016-03-31 MED ORDER — TAPENTADOL HCL 50 MG PO TABS: 25.0000 mg | ORAL_TABLET | Freq: Every day | ORAL | Status: DC | PRN

## 2016-03-31 MED ORDER — KETOROLAC TROMETHAMINE 30 MG/ML IJ SOLN: 30.0000 mg | Freq: Four times a day (QID) | INTRAMUSCULAR | Status: DC | PRN

## 2016-03-31 MED ORDER — 0.9 % SODIUM CHLORIDE (POUR BTL) OPTIME
TOPICAL | Status: DC | PRN
  Administered 2016-03-31: 1000 mL

## 2016-03-31 MED ORDER — SUMATRIPTAN SUCCINATE 100 MG PO TABS
100.0000 mg | ORAL_TABLET | ORAL | Status: DC | PRN
  Filled 2016-03-31: qty 1

## 2016-03-31 MED ORDER — DOCUSATE SODIUM 100 MG PO CAPS
100.0000 mg | ORAL_CAPSULE | Freq: Two times a day (BID) | ORAL | Status: DC
  Filled 2016-03-31: qty 1

## 2016-03-31 MED ORDER — DEXAMETHASONE SODIUM PHOSPHATE 10 MG/ML IJ SOLN
INTRAMUSCULAR | Status: DC | PRN
  Administered 2016-03-31: 10 mg via INTRAVENOUS

## 2016-03-31 MED ORDER — IRBESARTAN 75 MG PO TABS
37.5000 mg | ORAL_TABLET | Freq: Every day | ORAL | Status: DC
  Administered 2016-03-31: 37.5 mg via ORAL
  Filled 2016-03-31: qty 0.5

## 2016-03-31 MED ORDER — ONDANSETRON HCL 4 MG/2ML IJ SOLN
INTRAMUSCULAR | Status: DC | PRN
  Administered 2016-03-31: 4 mg via INTRAVENOUS

## 2016-03-31 MED ORDER — LIDOCAINE HCL (CARDIAC) 20 MG/ML IV SOLN
INTRAVENOUS | Status: DC | PRN
  Administered 2016-03-31: 100 mg via INTRAVENOUS

## 2016-03-31 MED ORDER — DULOXETINE HCL 60 MG PO CPEP: 60.0000 mg | ORAL_CAPSULE | Freq: Every day | ORAL | Status: DC

## 2016-03-31 MED ORDER — SODIUM CHLORIDE 0.9 % IV SOLN
INTRAVENOUS | Status: DC | PRN
  Administered 2016-03-31: 10 ug/kg/min via INTRAVENOUS

## 2016-03-31 MED ORDER — ACETAMINOPHEN 500 MG PO TABS
1000.0000 mg | ORAL_TABLET | ORAL | Status: AC
  Administered 2016-03-31: 1000 mg via ORAL
  Filled 2016-03-31: qty 2

## 2016-03-31 MED ORDER — CEFAZOLIN SODIUM-DEXTROSE 2-4 GM/100ML-% IV SOLN
2.0000 g | INTRAVENOUS | Status: AC
  Administered 2016-03-31: 2 g via INTRAVENOUS
  Filled 2016-03-31: qty 100

## 2016-03-31 MED ORDER — DIPHENHYDRAMINE HCL 25 MG PO CAPS: 25.0000 mg | ORAL_CAPSULE | Freq: Four times a day (QID) | ORAL | Status: DC | PRN

## 2016-03-31 MED ORDER — PROMETHAZINE HCL 25 MG PO TABS: 25.0000 mg | ORAL_TABLET | Freq: Four times a day (QID) | ORAL | Status: DC | PRN

## 2016-03-31 MED ORDER — PROPOFOL 10 MG/ML IV BOLUS
INTRAVENOUS | Status: DC | PRN
  Administered 2016-03-31: 200 mg via INTRAVENOUS

## 2016-03-31 MED ORDER — KETAMINE HCL 10 MG/ML IJ SOLN
INTRAMUSCULAR | Status: DC | PRN
  Administered 2016-03-31: 45 mg via INTRAVENOUS

## 2016-03-31 MED ORDER — FENTANYL CITRATE (PF) 100 MCG/2ML IJ SOLN
25.0000 ug | INTRAMUSCULAR | Status: DC | PRN
  Administered 2016-03-31 (×2): 50 ug via INTRAVENOUS

## 2016-03-31 MED ORDER — ROCURONIUM BROMIDE 100 MG/10ML IV SOLN
INTRAVENOUS | Status: DC | PRN
  Administered 2016-03-31: 10 mg via INTRAVENOUS
  Administered 2016-03-31: 50 mg via INTRAVENOUS

## 2016-03-31 MED ORDER — BUPIVACAINE HCL (PF) 0.25 % IJ SOLN
INTRAMUSCULAR | Status: DC | PRN
  Administered 2016-03-31: 4 mL

## 2016-03-31 MED ORDER — ONDANSETRON HCL 4 MG/2ML IJ SOLN: 4.0000 mg | Freq: Four times a day (QID) | INTRAMUSCULAR | Status: DC | PRN

## 2016-03-31 MED ORDER — FENTANYL CITRATE (PF) 100 MCG/2ML IJ SOLN
INTRAMUSCULAR | Status: DC | PRN
  Administered 2016-03-31: 100 ug via INTRAVENOUS

## 2016-03-31 MED ORDER — DEXAMETHASONE SODIUM PHOSPHATE 10 MG/ML IJ SOLN
INTRAMUSCULAR | Status: AC
  Filled 2016-03-31: qty 1

## 2016-03-31 MED ORDER — LEVOTHYROXINE SODIUM 112 MCG PO TABS: 112.0000 ug | ORAL_TABLET | ORAL | Status: DC

## 2016-03-31 MED ORDER — CEFAZOLIN SODIUM-DEXTROSE 2-4 GM/100ML-% IV SOLN
INTRAVENOUS | Status: AC
  Filled 2016-03-31: qty 100

## 2016-03-31 MED ORDER — LACTATED RINGERS IV SOLN
INTRAVENOUS | Status: DC | PRN
  Administered 2016-03-31: 07:00:00 via INTRAVENOUS

## 2016-03-31 MED ORDER — CELECOXIB 200 MG PO CAPS
400.0000 mg | ORAL_CAPSULE | ORAL | Status: AC
  Administered 2016-03-31: 400 mg via ORAL
  Filled 2016-03-31: qty 2

## 2016-03-31 MED ORDER — ALBUTEROL SULFATE (2.5 MG/3ML) 0.083% IN NEBU: 3.0000 mL | INHALATION_SOLUTION | Freq: Four times a day (QID) | RESPIRATORY_TRACT | Status: DC | PRN

## 2016-03-31 MED ORDER — SUGAMMADEX SODIUM 200 MG/2ML IV SOLN
INTRAVENOUS | Status: DC | PRN
  Administered 2016-03-31: 200 mg via INTRAVENOUS

## 2016-03-31 MED ORDER — DULOXETINE HCL 30 MG PO CPEP: 30.0000 mg | ORAL_CAPSULE | Freq: Every day | ORAL | Status: DC

## 2016-03-31 MED ORDER — ACETAMINOPHEN 500 MG PO TABS
1000.0000 mg | ORAL_TABLET | Freq: Four times a day (QID) | ORAL | Status: DC
  Administered 2016-03-31: 1000 mg via ORAL
  Filled 2016-03-31 (×2): qty 2

## 2016-03-31 MED ORDER — MIDAZOLAM HCL 2 MG/2ML IJ SOLN
INTRAMUSCULAR | Status: AC
  Filled 2016-03-31: qty 2

## 2016-03-31 MED ORDER — BUPIVACAINE LIPOSOME 1.3 % IJ SUSP
20.0000 mL | Freq: Once | INTRAMUSCULAR | Status: AC
  Administered 2016-03-31: 20 mL
  Filled 2016-03-31: qty 20

## 2016-03-31 MED ORDER — PROMETHAZINE HCL 25 MG/ML IJ SOLN
INTRAMUSCULAR | Status: AC
  Filled 2016-03-31: qty 1

## 2016-03-31 MED ORDER — PROMETHAZINE HCL 25 MG/ML IJ SOLN
6.2500 mg | Freq: Once | INTRAMUSCULAR | Status: AC
  Administered 2016-03-31: 6.25 mg via INTRAVENOUS

## 2016-03-31 MED ORDER — SCOPOLAMINE 1 MG/3DAYS TD PT72
MEDICATED_PATCH | TRANSDERMAL | Status: DC | PRN
  Administered 2016-03-31: 1 via TRANSDERMAL

## 2016-03-31 MED ORDER — DIPHENHYDRAMINE HCL 50 MG/ML IJ SOLN: 25.0000 mg | Freq: Four times a day (QID) | INTRAMUSCULAR | Status: DC | PRN

## 2016-03-31 MED ORDER — KETAMINE HCL 10 MG/ML IJ SOLN
INTRAMUSCULAR | Status: AC
  Filled 2016-03-31: qty 1

## 2016-03-31 MED ORDER — ENOXAPARIN SODIUM 40 MG/0.4ML ~~LOC~~ SOLN: 40.0000 mg | SUBCUTANEOUS | Status: DC

## 2016-03-31 MED ORDER — MIDAZOLAM HCL 5 MG/5ML IJ SOLN
INTRAMUSCULAR | Status: DC | PRN
  Administered 2016-03-31: 2 mg via INTRAVENOUS

## 2016-03-31 MED ORDER — PROPOFOL 10 MG/ML IV BOLUS
INTRAVENOUS | Status: AC
  Filled 2016-03-31: qty 20

## 2016-03-31 MED ORDER — GABAPENTIN 300 MG PO CAPS
300.0000 mg | ORAL_CAPSULE | ORAL | Status: DC
  Filled 2016-03-31: qty 1

## 2016-03-31 MED ORDER — SODIUM CHLORIDE 0.9 % IJ SOLN
INTRAMUSCULAR | Status: DC | PRN
  Administered 2016-03-31: 30 mL

## 2016-03-31 MED ORDER — METHOCARBAMOL 500 MG PO TABS
500.0000 mg | ORAL_TABLET | Freq: Three times a day (TID) | ORAL | Status: DC | PRN
  Administered 2016-03-31 – 2016-04-01 (×2): 500 mg via ORAL
  Filled 2016-03-31 (×2): qty 1

## 2016-03-31 MED ORDER — SODIUM CHLORIDE 0.9 % IJ SOLN
INTRAMUSCULAR | Status: AC
Start: 2016-03-31 — End: 2016-03-31
  Filled 2016-03-31: qty 50

## 2016-03-31 MED ORDER — SUGAMMADEX SODIUM 200 MG/2ML IV SOLN
INTRAVENOUS | Status: AC
  Filled 2016-03-31: qty 2

## 2016-03-31 MED ORDER — BUPIVACAINE HCL (PF) 0.25 % IJ SOLN
INTRAMUSCULAR | Status: AC
  Filled 2016-03-31: qty 60

## 2016-03-31 MED ORDER — FENTANYL CITRATE (PF) 100 MCG/2ML IJ SOLN
INTRAMUSCULAR | Status: AC
  Filled 2016-03-31: qty 2

## 2016-03-31 MED ORDER — PANTOPRAZOLE SODIUM 40 MG PO TBEC: 40.0000 mg | DELAYED_RELEASE_TABLET | Freq: Every day | ORAL | Status: DC

## 2016-03-31 MED ORDER — FENTANYL CITRATE (PF) 250 MCG/5ML IJ SOLN
INTRAMUSCULAR | Status: AC
  Filled 2016-03-31: qty 5

## 2016-03-31 MED ORDER — FENTANYL CITRATE (PF) 100 MCG/2ML IJ SOLN
12.5000 ug | INTRAMUSCULAR | Status: DC | PRN
  Administered 2016-03-31: 12.5 ug via INTRAVENOUS
  Filled 2016-03-31: qty 2

## 2016-03-31 MED ORDER — KETOROLAC TROMETHAMINE 30 MG/ML IJ SOLN
30.0000 mg | Freq: Three times a day (TID) | INTRAMUSCULAR | Status: DC
  Administered 2016-03-31 – 2016-04-01 (×3): 30 mg via INTRAVENOUS
  Filled 2016-03-31 (×3): qty 1

## 2016-03-31 MED ORDER — LEVOTHYROXINE SODIUM 112 MCG PO TABS
112.0000 ug | ORAL_TABLET | Freq: Once | ORAL | Status: DC
  Filled 2016-03-31: qty 1

## 2016-03-31 MED ORDER — SCOPOLAMINE 1 MG/3DAYS TD PT72
MEDICATED_PATCH | TRANSDERMAL | Status: AC
  Filled 2016-03-31: qty 1

## 2016-03-31 SURGICAL SUPPLY — 51 items
APPLIER CLIP LOGIC TI 5 (MISCELLANEOUS) IMPLANT
BINDER ABDOMINAL 12 ML 46-62 (SOFTGOODS) IMPLANT
CABLE HIGH FREQUENCY MONO STRZ (ELECTRODE) ×2 IMPLANT
CHLORAPREP W/TINT 26ML (MISCELLANEOUS) ×2 IMPLANT
COVER SURGICAL LIGHT HANDLE (MISCELLANEOUS) ×2 IMPLANT
DECANTER SPIKE VIAL GLASS SM (MISCELLANEOUS) ×2 IMPLANT
DERMABOND ADVANCED (GAUZE/BANDAGES/DRESSINGS) ×1
DERMABOND ADVANCED .7 DNX12 (GAUZE/BANDAGES/DRESSINGS) ×1 IMPLANT
DEVICE SECURE STRAP 25 ABSORB (INSTRUMENTS) IMPLANT
DEVICE TROCAR PUNCTURE CLOSURE (ENDOMECHANICALS) ×2 IMPLANT
DRAIN CHANNEL 19F RND (DRAIN) IMPLANT
DRAPE INCISE IOBAN 66X45 STRL (DRAPES) IMPLANT
DRAPE UTILITY XL STRL (DRAPES) IMPLANT
ELECT PENCIL ROCKER SW 15FT (MISCELLANEOUS) ×2 IMPLANT
ELECT REM PT RETURN 9FT ADLT (ELECTROSURGICAL) ×2
ELECTRODE REM PT RTRN 9FT ADLT (ELECTROSURGICAL) ×1 IMPLANT
EVACUATOR SILICONE 100CC (DRAIN) IMPLANT
GLOVE BIO SURGEON STRL SZ 6.5 (GLOVE) ×2 IMPLANT
GLOVE BIO SURGEON STRL SZ7.5 (GLOVE) ×2 IMPLANT
GLOVE BIOGEL PI IND STRL 6 (GLOVE) ×1 IMPLANT
GLOVE BIOGEL PI IND STRL 7.0 (GLOVE) ×1 IMPLANT
GLOVE BIOGEL PI IND STRL 7.5 (GLOVE) ×2 IMPLANT
GLOVE BIOGEL PI IND STRL 8 (GLOVE) ×1 IMPLANT
GLOVE BIOGEL PI INDICATOR 6 (GLOVE) ×1
GLOVE BIOGEL PI INDICATOR 7.0 (GLOVE) ×1
GLOVE BIOGEL PI INDICATOR 7.5 (GLOVE) ×2
GLOVE BIOGEL PI INDICATOR 8 (GLOVE) ×1
GLOVE ECLIPSE 7.0 STRL STRAW (GLOVE) ×2 IMPLANT
GOWN STRL REUS W/TWL LRG LVL3 (GOWN DISPOSABLE) ×4 IMPLANT
GOWN STRL REUS W/TWL XL LVL3 (GOWN DISPOSABLE) ×4 IMPLANT
IRRIG SUCT STRYKERFLOW 2 WTIP (MISCELLANEOUS)
IRRIGATION SUCT STRKRFLW 2 WTP (MISCELLANEOUS) IMPLANT
KIT BASIN OR (CUSTOM PROCEDURE TRAY) ×2 IMPLANT
L-HOOK LAP DISP 36CM (ELECTROSURGICAL)
LHOOK LAP DISP 36CM (ELECTROSURGICAL) IMPLANT
MARKER SKIN DUAL TIP RULER LAB (MISCELLANEOUS) ×2 IMPLANT
MESH VENTRALEX ST 8CM LRG (Mesh General) ×2 IMPLANT
NEEDLE SPNL 22GX3.5 QUINCKE BK (NEEDLE) ×2 IMPLANT
SCISSORS LAP 5X35 DISP (ENDOMECHANICALS) ×2 IMPLANT
SHEARS HARMONIC ACE PLUS 36CM (ENDOMECHANICALS) IMPLANT
SLEEVE XCEL OPT CAN 5 100 (ENDOMECHANICALS) ×2 IMPLANT
SUT ETHILON 2 0 PS N (SUTURE) IMPLANT
SUT MNCRL AB 4-0 PS2 18 (SUTURE) ×4 IMPLANT
SUT NOVA NAB GS-21 0 18 T12 DT (SUTURE) ×6 IMPLANT
TOWEL OR 17X26 10 PK STRL BLUE (TOWEL DISPOSABLE) ×2 IMPLANT
TOWEL OR NON WOVEN STRL DISP B (DISPOSABLE) ×2 IMPLANT
TRAY FOLEY CATH 14FR (SET/KITS/TRAYS/PACK) ×2 IMPLANT
TRAY LAPAROSCOPIC (CUSTOM PROCEDURE TRAY) ×2 IMPLANT
TROCAR BLADELESS OPT 5 100 (ENDOMECHANICALS) ×2 IMPLANT
TROCAR XCEL NON-BLD 11X100MML (ENDOMECHANICALS) IMPLANT
TUBING INSUF HEATED (TUBING) ×2 IMPLANT

## 2016-03-31 NOTE — Transfer of Care (Signed)
Immediate Anesthesia Transfer of Care Note  Patient: Angel Sellers  Procedure(s) Performed: Procedure(s): LAPAROSCOPIC ASSISTED REPAIR OF INCISIONAL HERNIA (N/A) INSERTION OF MESH (N/A)  Patient Location: PACU  Anesthesia Type:General  Level of Consciousness: awake, alert  and oriented  Airway & Oxygen Therapy: Patient Spontanous Breathing and Patient connected to face mask oxygen  Post-op Assessment: Report given to RN and Post -op Vital signs reviewed and stable  Post vital signs: Reviewed and stable  Last Vitals:  Vitals:   03/31/16 0542  BP: (!) 152/97  Pulse: (!) 103  Resp: 16  Temp: 36.8 C    Last Pain:  Vitals:   03/31/16 0609  TempSrc:   PainSc: 2       Patients Stated Pain Goal: 3 ([id number])  Complications: No apparent anesthesia complications

## 2016-03-31 NOTE — Anesthesia Postprocedure Evaluation (Signed)
Anesthesia Post Note  Patient: Angel Sellers  Procedure(s) Performed: Procedure(s) (LRB): LAPAROSCOPIC ASSISTED REPAIR OF INCISIONAL HERNIA (N/A) INSERTION OF MESH (N/A)  Patient location during evaluation: PACU Anesthesia Type: General Level of consciousness: awake and alert Pain management: pain level controlled Vital Signs Assessment: post-procedure vital signs reviewed and stable Respiratory status: spontaneous breathing, nonlabored ventilation, respiratory function stable and patient connected to nasal cannula oxygen Cardiovascular status: blood pressure returned to baseline and stable Postop Assessment: no signs of nausea or vomiting Anesthetic complications: no       Last Vitals:  Vitals:   03/31/16 1050 03/31/16 1200  BP: (!) 145/85 (!) 142/86  Pulse: (!) 105 99  Resp: 12 14  Temp: 37.3 C 37.3 C    Last Pain:  Vitals:   03/31/16 1200  TempSrc: Oral  PainSc:                  [name],W. EDMOND

## 2016-03-31 NOTE — Anesthesia Procedure Notes (Signed)
Procedure Name: Intubation Date/Time: 03/31/2016 7:45 AM Performed by: Glory Buff Pre-anesthesia Checklist: Patient identified, Emergency Drugs available, Suction available, Patient being monitored and Timeout performed Patient Re-evaluated:Patient Re-evaluated prior to inductionOxygen Delivery Method: Circle system utilized Preoxygenation: Pre-oxygenation with 100% oxygen Intubation Type: IV induction Ventilation: Mask ventilation without difficulty Laryngoscope Size: Miller and 2 Grade View: Grade I Tube type: Oral Tube size: 7.5 mm Number of attempts: 1 Airway Equipment and Method: Stylet Placement Confirmation: ETT inserted through vocal cords under direct vision,  positive ETCO2 and breath sounds checked- equal and bilateral Secured at: 21 cm Tube secured with: Tape Dental Injury: Teeth and Oropharynx as per pre-operative assessment  Comments: Intubated by Jackquline Bosch SRNA

## 2016-03-31 NOTE — H&P (Signed)
Angel Sellers is an 56 y.o. female.   Chief Complaint: here for surgery HPI: The patient is a 56 year old female who presents with an incisional hernia. She is referred by Dr. Laurann Montana for evaluation of an epigastric incisional hernia. I operated on her daughter a few years ago for weight loss surgery. She states that she had laparoscopic surgery for kidney cancer which runs in her family. She is cancer free currently. However she developed an infection at the epigastric trocar site after surgery and as a result formed a small hernia. It has enlarged over time. It is now causing some discomfort when she has bloating. She does have a issue with infrequent bowel movements chronically. She averages a bowel movement every 4 days. She denies any nausea or vomiting. She denies any melena or hematochezia. She has some tenderness around the hernia site when she bloats. Otherwise she denies any chest pain, chest pressure, shortness of breath, orthopnea. She does have fibromyalgia. She does have issues with oral narcotics causing severe nausea and inquires about potential postoperative pain control  Only changes since cllinic visit - removal of toe hardware  Past Medical History:  Diagnosis Date  . Arthritis   . Cancer (HCC)    renal  . Chronic fatigue   . Fibromyalgia   . GERD (gastroesophageal reflux disease)   . Headache   . History of kidney cancer   . Hypertension   . Hypothyroidism   . Interstitial cystitis   . Neuromuscular disorder ([place])    nerve  . PONV (postoperative nausea and vomiting)    need scope patch  . POTS (postural orthostatic tachycardia syndrome)   . Thyroid disease     Past Surgical History:  Procedure Laterality Date  . ABDOMINAL HYSTERECTOMY  1999  . BONE BIOPSY Right 05/01/2013   Procedure: BIOPSY NAIL BED RIGHT THUMB, BIOPSY RIGHT GREAT TOE ;  Surgeon: Wynonia Sours, MD;  Location: [place];  Service: Orthopedics;  Laterality: Right;  .  CESAREAN SECTION     2 c-cestions  . COLONOSCOPY    . CYSTOSCOPY    . DISTAL INTERPHALANGEAL JOINT FUSION Left 02/26/2016   Procedure: LEFT 5TH TOE DISTAL INTERPHALANGEAL JOINT FUSION;  Surgeon: Newt Minion, MD;  Location: [place];  Service: Orthopedics;  Laterality: Left;  . KIDNEY SURGERY  2011   rt partial nephrectomy-[facility]  . TONSILLECTOMY      Family History  Problem Relation Age of Onset  . Melanoma Mother    Social History:  reports that she has never smoked. She has never used smokeless tobacco. She reports that she does not drink alcohol or use drugs.  Allergies:  Allergies  Allergen Reactions  . Dilaudid [Hydromorphone Hcl] Other (See Comments)    hypotension  . Ciprofloxacin Itching, Rash and Other (See Comments)    Chest tightness/severe vertigo  . Lactose Intolerance (Gi) Other (See Comments)    GI upset  . Meperidine Nausea And Vomiting  . Morphine Hives and Rash    Medications Prior to Admission  Medication Sig Dispense Refill  . amphetamine-dextroamphetamine (ADDERALL XR) 20 MG 24 hr capsule Take 20 mg by mouth daily.    . DULoxetine (CYMBALTA) 30 MG capsule Take 30 mg by mouth daily.    . DULoxetine (CYMBALTA) 60 MG capsule Take 60 mg by mouth daily.    . fentaNYL (DURAGESIC - DOSED MCG/HR) 12 MCG/HR Place 1 patch (12.5 mcg total) onto the skin every three (3) days as  needed (for pain resulting from broken bones.). 5 patch 0  . fentaNYL (DURAGESIC) 25 MCG/HR patch Place 1 patch (25 mcg total) onto the skin every 3 (three) days. (Patient taking differently: Place 25 mcg onto the skin every three (3) days as needed (for pain resulting from broken bones/post operatively). ) 5 patch 0  . levothyroxine (SYNTHROID, LEVOTHROID) 112 MCG tablet Take 112 mcg by mouth every other day.     . levothyroxine (SYNTHROID, LEVOTHROID) 125 MCG tablet Take 125 mcg by mouth every other day.     . methocarbamol (ROBAXIN) 500 MG tablet Take 500 mg by mouth 3 (three) times daily as  needed (for muscle spasm/pain.).     Marland Kitchen olmesartan (BENICAR) 5 MG tablet Take 5 mg by mouth daily.     Marland Kitchen omeprazole (PRILOSEC) 40 MG capsule Take 40 mg by mouth daily.    Marland Kitchen OVER THE COUNTER MEDICATION Take 1 capsule by mouth daily. Cysta-Q (Proprietary Blend)  Quercetin/bromelain/papin/cranberry fruit/passion flower herb/valerian root/wood betony leaf    . tapentadol (NUCYNTA) 50 MG TABS tablet Take 25 mg by mouth daily as needed (for pain due to exertion/falls).     Marland Kitchen albuterol (PROVENTIL HFA;VENTOLIN HFA) 108 (90 Base) MCG/ACT inhaler Inhale 2 puffs into the lungs every 6 (six) hours as needed for wheezing or shortness of breath.    . Cholecalciferol (VITAMIN D) 2000 units tablet Take 2,000 Units by mouth daily.    . methylPREDNISolone (MEDROL DOSEPAK) 4 MG TBPK tablet Take as directed (Patient not taking: Reported on 03/21/2016) 21 tablet 0  . promethazine (PHENERGAN) 25 MG tablet Take 25 mg by mouth every 6 (six) hours as needed (for nausea/vomiting with migraines.).     Marland Kitchen rizatriptan (MAXALT) 10 MG tablet Take 10 mg by mouth daily as needed for migraine. May repeat in 2 hours if needed      No results found for this or any previous visit (from the past 48 hour(s)). No results found.  Review of Systems  Constitutional: Negative for weight loss.  HENT: Negative for nosebleeds.   Eyes: Negative for blurred vision.  Respiratory: Negative for shortness of breath.   Cardiovascular: Negative for chest pain, palpitations, orthopnea and PND.       Denies DOE  Gastrointestinal: Negative for nausea and vomiting.  Genitourinary: Negative for dysuria and hematuria.  Musculoskeletal: Negative.   Skin: Negative for itching and rash.  Neurological: Negative for dizziness, focal weakness, seizures, loss of consciousness and headaches.       Denies TIAs, amaurosis fugax  Endo/Heme/Allergies: Does not bruise/bleed easily.  Psychiatric/Behavioral: The patient is not nervous/anxious.     Blood pressure  (!) 152/97, pulse (!) 103, temperature 98.3 F (36.8 C), temperature source Oral, resp. rate 16, height 5\' 5"  (1.651 m), weight 87.1 kg (192 lb), SpO2 99 %. Physical Exam  Vitals reviewed. Constitutional: She is oriented to person, place, and time. She appears well-developed and well-nourished. No distress.  HENT:  Head: Normocephalic and atraumatic.  Right Ear: External ear normal.  Left Ear: External ear normal.  Eyes: Conjunctivae are normal. No scleral icterus.  Neck: Normal range of motion. Neck supple. No tracheal deviation present. No thyromegaly present.  Cardiovascular: Normal rate and normal heart sounds.   Respiratory: Effort normal and breath sounds normal. No stridor. No respiratory distress. She has no wheezes.  GI: Soft. There is no tenderness. There is no rebound. A hernia is present. Hernia confirmed positive in the ventral area.    Supraumbilical incisional hernia -  soft, nt  Musculoskeletal: She exhibits no edema or tenderness.  Lymphadenopathy:    She has no cervical adenopathy.  Neurological: She is alert and oriented to person, place, and time. She exhibits normal muscle tone.  Skin: Skin is warm and dry. No rash noted. She is not diaphoretic. No erythema. No pallor.  Psychiatric: She has a normal mood and affect. Her behavior is normal. Judgment and thought content normal.     Assessment/Plan INCISIONAL HERNIA, WITHOUT OBSTRUCTION OR GANGRENE (K43.2) Impression: We discussed the etiology of ventral hernias. We discussed the signs and symptoms of incarceration and strangulation. The patient was given educational material. I also drew diagrams.  We discussed nonoperative and operative management. With respect to operative management, we discussed both open repair and laparoscopic repair. We discussed the pros and cons of each approach. I discussed the typical aftercare with each procedure and how each procedure differs.  The patient has elected to laparoscopic  assisted repair of incisional hernia with mesh  We discussed the risk and benefits of surgery including but not limited to bleeding, infection, injury to surrounding structures, hernia recurrence, mesh complications, hematoma/seroma formation, need to convert to an open procedure, blood clot formation, urinary retention, post operative ileus, general anesthesia risk, long-term abdominal pain. We discussed that this procedure can be quite uncomfortable and difficult to recover from based on how the mesh is secured to the abdominal wall. We discussed the importance of avoiding heavy lifting and straining for a period of 6 weeks.  She states in the past the only narcotic that did not cause severe nausea and vomiting was a fentanyl patch.  To or for repair  Leighton Ruff. Redmond Pulling, MD, [facility] Minimally Invasive Surgery [facility], [place]   Gayland Curry, MD 03/31/2016, 7:26 AM

## 2016-03-31 NOTE — Op Note (Signed)
Angel Sellers [id number] 1960/04/27 03/31/2016  Laparoscopic Assisted Repair of Incisional Ventral Hernia with mesh Procedure Note  Indications: Symptomatic ventral incisional hernia  Pre-operative Diagnosis: ventral incisional hernia  Post-operative Diagnosis: same  Surgeon: Gayland Curry   Assistants: Arlana Lindau PA-S  Anesthesia: General endotracheal anesthesia  Procedure Details  The patient was seen in the Holding Room. The risks, benefits, complications, treatment options, and expected outcomes were discussed with the patient. The possibilities of reaction to medication, pulmonary aspiration, perforation of viscus, bleeding, recurrent infection, the need for additional procedures, failure to diagnose a condition, and creating a complication requiring transfusion or operation were discussed with the patient. The patient concurred with the proposed plan, giving informed consent.  The site of surgery properly noted/marked. The patient was taken to the operating room, identified as Angel Sellers and the procedure verified as laparoscopic ventral hernia repair with mesh. A Time Out was held and the above information confirmed.    The patient was placed supine.  After establishing general anesthesia, she was in/out catherized.  The abdomen was prepped with Chloraprep and draped in standard fashion.  A 5 mm Optiview was used the cannulate the peritoneal cavity in the left upper quadrant below the costal margin.  Pneumoperitoneum was obtained by insufflating CO2, maintaining a maximum pressure of 15 mmHg.  The 5 mm 30-degree laparoscopic was inserted.  There were no significant omental adhesions to the anterior abdominal wall in and around the hernia defect in the supraumbilical location at her prior trocar site from kidney surgery.  An 5-mm port was placed in the left anterior axillary line at the level of the umbilicus.  Marilyn dissector with/without cautery and gentle traction were used to  dissect the omental adhesions and plug of hernia fat away from the anterior abdominal wall and from the defect. The defect was located to the left of the midline. The falciform ligament was right next to it. The falciform ligament was taken down with EndoShears with electrocautery..  We cleared the entire abdominal wall and were able to visualize 1 fascial defects. We used a spinal needle to identify the extent of the hernia defects. It measured 2 cm x 2.5 cm.    We selected a round 8.4cm piece of bard ventralight st hernia patch mesh.  We placed 4 stay sutures of 0 Novofil around the edges of the mesh and 1 suture thru the middle of the mesh.   I incised her old supraumbilical incision which was slightly off of the midline. Dissection was carried down to the hernia sac located above the fascia and was mobilized from surrounding structures. Intact fascia was identified circumferentially around the defect. Skin and soft tissue was mobilized from the surface of the fascia in a circumferential manner. A finger sweep was performed underneath the fascia to ensure there were no adhesions to the anterior abdominal wall. Again defect was 2 x 2.5cm.  The mesh was then placed thru the defect.  The fascia was then closed transversely  with 5 interrupted 1-0-novafil sutures.    Pneumoperitoneum was reestablished. The mesh was then unrolled.  The stay sutures were then pulled up through small stab incisions using the Endo-close device.  This deployed the mesh widely over the fascial defects.  The stay sutures were then tied down.  The mesh was well anchored against the abdominal wall with the 5 transfascial sutures therefore I did not place any tacks into the mesh. We inspected for hemostasis.  The optical entry site  was inspected and there is no evidence of injury. X Purnell was infiltrated in a regional fashion in and around the fascial closure and in and around the abdominal wall suture sites..  Pneumoperitoneum was then  released as we removed the remainder of the trocars.  The port sites and small supraumbilical incision were closed with 4-0 Monocryl.  All of the incisions and stay suture sites were then sealed with Dermabond.  An abdominal binder was placed around the patient's abdomen.  The patient was extubated and brought to the recovery room in stable condition.  All sponge, instrument, and needle counts were correct prior to closure and at the conclusion of the case.   Findings: Type of repair - primary suture with mesh underlay  (choices - primary suture, mesh, or component)  Name of mesh - bard ventral light ST hernia patch  Size of mesh - 8.4 cm  Mesh overlap - more than 4 cm  Placement of mesh -beneath fascia and into the peritoneal cavity  (choices - beneath fascia and into peritoneal cavity, beneath fascia but external to peritoneal cavity, between the muscle and fascia, above or external to fascia)    Estimated Blood Loss:  Minimal         Complications:  None; patient tolerated the procedure well.         Disposition: PACU - hemodynamically stable.         Condition: stable  Leighton Ruff. Redmond Pulling, MD, FACS General, Bariatric, & Minimally Invasive Surgery [facility], [place]

## 2016-03-31 NOTE — Anesthesia Procedure Notes (Signed)
Procedure Name: Intubation Date/Time: 03/31/2016 7:45 AM Performed by: Glory Buff Pre-anesthesia Checklist: Patient identified, Emergency Drugs available, Suction available and Patient being monitored Patient Re-evaluated:Patient Re-evaluated prior to inductionOxygen Delivery Method: Circle system utilized Preoxygenation: Pre-oxygenation with 100% oxygen Intubation Type: IV induction Ventilation: Mask ventilation without difficulty Laryngoscope Size: Miller and 2 Grade View: Grade I Tube type: Oral Number of attempts: 1 Airway Equipment and Method: Stylet and Oral airway Placement Confirmation: ETT inserted through vocal cords under direct vision,  positive ETCO2 and breath sounds checked- equal and bilateral Secured at: 21 cm Tube secured with: Tape Dental Injury: Teeth and Oropharynx as per pre-operative assessment

## 2016-04-01 ENCOUNTER — Encounter (HOSPITAL_COMMUNITY): Payer: Self-pay | Admitting: General Surgery

## 2016-04-01 DIAGNOSIS — E739 Lactose intolerance, unspecified: Secondary | ICD-10-CM | POA: Diagnosis not present

## 2016-04-01 DIAGNOSIS — M797 Fibromyalgia: Secondary | ICD-10-CM | POA: Diagnosis not present

## 2016-04-01 DIAGNOSIS — K432 Incisional hernia without obstruction or gangrene: Secondary | ICD-10-CM | POA: Diagnosis not present

## 2016-04-01 DIAGNOSIS — G43909 Migraine, unspecified, not intractable, without status migrainosus: Secondary | ICD-10-CM | POA: Diagnosis not present

## 2016-04-01 DIAGNOSIS — I10 Essential (primary) hypertension: Secondary | ICD-10-CM | POA: Diagnosis not present

## 2016-04-01 DIAGNOSIS — K219 Gastro-esophageal reflux disease without esophagitis: Secondary | ICD-10-CM | POA: Diagnosis not present

## 2016-04-01 LAB — CBC
HEMATOCRIT: 35.3 % — AB (ref 36.0–46.0)
HEMOGLOBIN: 11.5 g/dL — AB (ref 12.0–15.0)
MCH: 27.5 pg (ref 26.0–34.0)
MCHC: 32.6 g/dL (ref 30.0–36.0)
MCV: 84.4 fL (ref 78.0–100.0)
Platelets: 252 10*3/uL (ref 150–400)
RBC: 4.18 MIL/uL (ref 3.87–5.11)
RDW: 14 % (ref 11.5–15.5)
WBC: 14 10*3/uL — ABNORMAL HIGH (ref 4.0–10.5)

## 2016-04-01 NOTE — Discharge Summary (Signed)
Physician Discharge Summary  Angel Sellers [id number] DOB: 1960-10-26 DOA: 03/31/2016  PCP: Irven Shelling, MD  Admit date: 03/31/2016 Discharge date: 04/01/2016  Recommendations for Outpatient Follow-up:   Follow-up Information    Angel Curry, MD Follow up on 04/28/2016.   Specialty:  General Surgery Why:  2 pm - please arrive at 1:45 PM Contact information: [place] [address] [phone]          Discharge Diagnoses:  1. Incisional hernia 2. htn 3. Chronic migraine  Surgical Procedure: Laparoscopic-assisted repair of incisional hernia with mesh  Discharge Condition: Good Disposition: home  Diet recommendation: regular  Filed Weights   03/31/16 0609  Weight: 87.1 kg (192 lb)    Hospital Course:  The patient came in for a planned repair of a small ventral incisional hernia with mesh. She did well. She was kept overnight for pain control. On postoperative day 1 she was doing well. She was tolerating a diet. Her pain was well controlled. Her vital signs are stable. She had walked around without difficulty. She was requesting discharge. The patient also requested no pain medication rx on discharge  BP 130/73 (BP Location: Right Arm)   Pulse 91   Temp 98.6 F (37 C) (Oral)   Resp 16   Ht 5\' 5"  (1.651 m)   Wt 87.1 kg (192 lb)   SpO2 100%   BMI 31.95 kg/m   Gen: alert, NAD, non-toxic appearing Pupils: equal, no scleral icterus Pulm: Lungs clear to auscultation, symmetric chest rise CV: regular rate and rhythm Abd: soft,  Mild approp tender, nondistended. . No cellulitis. No incisional hernia Ext: no edema, no calf tenderness Skin: no rash, no jaundice    Discharge Instructions  Discharge Instructions    Call MD for:    Complete by:  As directed    Temperature >101   Call MD for:  hives    Complete by:  As directed    Call MD for:  persistant dizziness or light-headedness    Complete by:  As directed    Call MD for:   persistant nausea and vomiting    Complete by:  As directed    Call MD for:  redness, tenderness, or signs of infection (pain, swelling, redness, odor or green/yellow discharge around incision site)    Complete by:  As directed    Call MD for:  severe uncontrolled pain    Complete by:  As directed    Diet - low sodium heart healthy    Complete by:  As directed    Discharge instructions    Complete by:  As directed    See [facility] discharge instructions   Increase activity slowly    Complete by:  As directed      Allergies as of 04/01/2016      Reactions   Dilaudid [hydromorphone Hcl] Other (See Comments)   hypotension   Ciprofloxacin Itching, Rash, Other (See Comments)   Chest tightness/severe vertigo   Lactose Intolerance (gi) Other (See Comments)   GI upset   Meperidine Nausea And Vomiting   Morphine Hives, Rash      Medication List    STOP taking these medications   methylPREDNISolone 4 MG Tbpk tablet Commonly known as:  MEDROL DOSEPAK     TAKE these medications   albuterol 108 (90 Base) MCG/ACT inhaler Commonly known as:  PROVENTIL HFA;VENTOLIN HFA Inhale 2 puffs into the lungs every 6 (six) hours as needed for wheezing or  shortness of breath.   amphetamine-dextroamphetamine 20 MG 24 hr capsule Commonly known as:  ADDERALL XR Take 20 mg by mouth daily.   DULoxetine 30 MG capsule Commonly known as:  CYMBALTA Take 30 mg by mouth daily.   DULoxetine 60 MG capsule Commonly known as:  CYMBALTA Take 60 mg by mouth daily.   fentaNYL 25 MCG/HR patch Commonly known as:  DURAGESIC Place 1 patch (25 mcg total) onto the skin every 3 (three) days. What changed:  when to take this  reasons to take this   fentaNYL 12 MCG/HR Commonly known as:  DURAGESIC - dosed mcg/hr Place 1 patch (12.5 mcg total) onto the skin every three (3) days as needed (for pain resulting from broken bones.). What changed:  Another medication with the same name was changed. Make sure you  understand how and when to take each.   levothyroxine 125 MCG tablet Commonly known as:  SYNTHROID, LEVOTHROID Take 125 mcg by mouth every other day.   levothyroxine 112 MCG tablet Commonly known as:  SYNTHROID, LEVOTHROID Take 112 mcg by mouth every other day.   olmesartan 5 MG tablet Commonly known as:  BENICAR Take 5 mg by mouth daily.   omeprazole 40 MG capsule Commonly known as:  PRILOSEC Take 40 mg by mouth daily.   OVER THE COUNTER MEDICATION Take 1 capsule by mouth daily. Cysta-Q (Proprietary Blend)  Quercetin/bromelain/papin/cranberry fruit/passion flower herb/valerian root/wood betony leaf   promethazine 25 MG tablet Commonly known as:  PHENERGAN Take 25 mg by mouth every 6 (six) hours as needed (for nausea/vomiting with migraines.).   rizatriptan 10 MG tablet Commonly known as:  MAXALT Take 10 mg by mouth daily as needed for migraine. May repeat in 2 hours if needed   ROBAXIN 500 MG tablet Generic drug:  methocarbamol Take 500 mg by mouth 3 (three) times daily as needed (for muscle spasm/pain.).   tapentadol 50 MG tablet Commonly known as:  NUCYNTA Take 25 mg by mouth daily as needed (for pain due to exertion/falls).   Vitamin D 2000 units tablet Take 2,000 Units by mouth daily.      Follow-up Information    Angel Curry, MD Follow up on 04/28/2016.   Specialty:  General Surgery Why:  2 pm - please arrive at 1:45 PM Contact information: [place] [address] [phone]            The results of significant diagnostics from this hospitalization (including imaging, microbiology, ancillary and laboratory) are listed below for reference.    Significant Diagnostic Studies: No results found.  Microbiology: No results found for this or any previous visit (from the past 240 hour(s)).   Labs: Basic Metabolic Panel: No results for input(s): NA, K, CL, CO2, GLUCOSE, BUN, CREATININE, CALCIUM, MG, PHOS in the last 168  hours. Liver Function Tests: No results for input(s): AST, ALT, ALKPHOS, BILITOT, PROT, ALBUMIN in the last 168 hours. No results for input(s): LIPASE, AMYLASE in the last 168 hours. No results for input(s): AMMONIA in the last 168 hours. CBC:  Recent Labs Lab 04/01/16 0527  WBC 14.0*  HGB 11.5*  HCT 35.3*  MCV 84.4  PLT 252   Cardiac Enzymes: No results for input(s): CKTOTAL, CKMB, CKMBINDEX, TROPONINI in the last 168 hours. BNP: BNP (last 3 results) No results for input(s): BNP in the last 8760 hours.  ProBNP (last 3 results) No results for input(s): PROBNP in the last 8760 hours.  CBG: No results for  input(s): GLUCAP in the last 168 hours.  Active Problems:   Incisional hernia   Time coordinating discharge: 15 min  Signed:  Gayland Curry, MD [facility], [place] [phone] 04/01/2016, 8:43 AM

## 2016-04-01 NOTE — Discharge Instructions (Signed)
[facility]  UMBILICAL OR INGUINAL HERNIA REPAIR: POST OP INSTRUCTIONS  Always review your discharge instruction sheet given to you by the facility where your surgery was performed. IF YOU HAVE DISABILITY OR FAMILY LEAVE FORMS, YOU MUST BRING THEM TO THE OFFICE FOR PROCESSING.   DO NOT GIVE THEM TO YOUR DOCTOR.  1. A  prescription for pain medication may be given to you upon discharge.  Take your pain medication as prescribed, if needed.  If narcotic pain medicine is not needed, then you may take acetaminophen (Tylenol) or ibuprofen (Advil) as needed. 2. Take your usually prescribed medications unless otherwise directed. 3. If you need a refill on your pain medication, please contact your pharmacy.  They will contact our office to request authorization. Prescriptions will not be filled after 5 pm or on week-ends. 4. You should follow a light diet the first 24 hours after arrival home, such as soup and crackers, etc.  Be sure to include lots of fluids daily.  Resume your normal diet the day after surgery. 5. Most patients will experience some swelling and bruising around the umbilicus or in the groin and scrotum.  Ice packs and reclining will help.  Swelling and bruising can take several days to resolve.  6. It is common to experience some constipation if taking pain medication after surgery.  Increasing fluid intake and taking a stool softener (such as Colace) will usually help or prevent this problem from occurring.  A mild laxative (Milk of Magnesia or Miralax) should be taken according to package directions if there are no bowel movements after 48 hours. 7. Unless discharge instructions indicate otherwise, you may remove your bandages 24-48 hours after surgery, and you may shower at that time.  You may have steri-strips (small skin tapes) in place directly over the incision.  These strips should be left on the skin for 7-10 days.  If your surgeon used skin glue on the incision,  you may shower in 24 hours.  The glue will flake off over the next 2-3 weeks.  Any sutures or staples will be removed at the office during your follow-up visit. 8. ACTIVITIES:  You may resume regular (light) daily activities beginning the next day--such as daily self-care, walking, climbing stairs--gradually increasing activities as tolerated.  You may have sexual intercourse when it is comfortable.  Refrain from any heavy lifting or straining until approved by your doctor. a. You may drive when you are no longer taking prescription pain medication, you can comfortably wear a seatbelt, and you can safely maneuver your car and apply brakes. b. RETURN TO WORK:  9. You should see your doctor in the office for a follow-up appointment approximately 2-3 weeks after your surgery.  Make sure that you call for this appointment within a day or two after you arrive home to insure a convenient appointment time. 10. OTHER INSTRUCTIONS: DO NOT LIFT, PUSH, OR PULL ANYTHING GREATER THAN 15 LBS FOR AT LEAST 4 WEEKS    WHEN TO CALL YOUR DOCTOR: 1. Fever over 101.0 2. Inability to urinate 3. Nausea and/or vomiting 4. Extreme swelling or bruising 5. Continued bleeding from incision. 6. Increased pain, redness, or drainage from the incision  The clinic staff is available to answer your questions during regular business hours.  Please dont hesitate to call and ask to speak to one of the nurses for clinical concerns.  If you have a medical emergency, go to the nearest emergency room or call 911.  A surgeon from [facility] is always on call at the hospital   [address], [place], [place], [place]  [address] ?  [address], [place], [place]   [address] [phone] ? [phone] ? FAX [phone] Web site: [email or web address]

## 2016-04-28 ENCOUNTER — Other Ambulatory Visit: Payer: Self-pay | Admitting: Physician Assistant

## 2016-04-28 DIAGNOSIS — D492 Neoplasm of unspecified behavior of bone, soft tissue, and skin: Secondary | ICD-10-CM | POA: Diagnosis not present

## 2016-04-28 DIAGNOSIS — L509 Urticaria, unspecified: Secondary | ICD-10-CM | POA: Diagnosis not present

## 2016-04-28 DIAGNOSIS — L259 Unspecified contact dermatitis, unspecified cause: Secondary | ICD-10-CM | POA: Diagnosis not present

## 2016-04-28 DIAGNOSIS — L430 Hypertrophic lichen planus: Secondary | ICD-10-CM | POA: Diagnosis not present

## 2016-05-04 DIAGNOSIS — K219 Gastro-esophageal reflux disease without esophagitis: Secondary | ICD-10-CM | POA: Diagnosis not present

## 2016-05-04 DIAGNOSIS — R1013 Epigastric pain: Secondary | ICD-10-CM | POA: Diagnosis not present

## 2016-07-04 DIAGNOSIS — R05 Cough: Secondary | ICD-10-CM | POA: Diagnosis not present

## 2016-07-04 DIAGNOSIS — J028 Acute pharyngitis due to other specified organisms: Secondary | ICD-10-CM | POA: Diagnosis not present

## 2016-08-04 DIAGNOSIS — E611 Iron deficiency: Secondary | ICD-10-CM | POA: Diagnosis not present

## 2016-08-04 DIAGNOSIS — I10 Essential (primary) hypertension: Secondary | ICD-10-CM | POA: Diagnosis not present

## 2016-08-04 DIAGNOSIS — E039 Hypothyroidism, unspecified: Secondary | ICD-10-CM | POA: Diagnosis not present

## 2016-08-04 DIAGNOSIS — M797 Fibromyalgia: Secondary | ICD-10-CM | POA: Diagnosis not present

## 2016-08-04 DIAGNOSIS — J4531 Mild persistent asthma with (acute) exacerbation: Secondary | ICD-10-CM | POA: Diagnosis not present

## 2016-09-19 ENCOUNTER — Emergency Department (HOSPITAL_COMMUNITY): Payer: 59

## 2016-09-19 ENCOUNTER — Inpatient Hospital Stay (HOSPITAL_COMMUNITY)
Admission: EM | Admit: 2016-09-19 | Discharge: 2016-09-22 | DRG: 195 | Disposition: A | Discharge status: Home / Self Care | Payer: 59 | Attending: Internal Medicine | Admitting: Internal Medicine

## 2016-09-19 ENCOUNTER — Encounter (HOSPITAL_COMMUNITY): Payer: Self-pay | Admitting: Emergency Medicine

## 2016-09-19 DIAGNOSIS — I10 Essential (primary) hypertension: Secondary | ICD-10-CM | POA: Diagnosis present

## 2016-09-19 DIAGNOSIS — R918 Other nonspecific abnormal finding of lung field: Secondary | ICD-10-CM | POA: Diagnosis not present

## 2016-09-19 DIAGNOSIS — E039 Hypothyroidism, unspecified: Secondary | ICD-10-CM | POA: Diagnosis not present

## 2016-09-19 DIAGNOSIS — Z79899 Other long term (current) drug therapy: Secondary | ICD-10-CM

## 2016-09-19 DIAGNOSIS — R1011 Right upper quadrant pain: Secondary | ICD-10-CM | POA: Diagnosis not present

## 2016-09-19 DIAGNOSIS — J189 Pneumonia, unspecified organism: Secondary | ICD-10-CM

## 2016-09-19 DIAGNOSIS — M1990 Unspecified osteoarthritis, unspecified site: Secondary | ICD-10-CM | POA: Diagnosis present

## 2016-09-19 DIAGNOSIS — N390 Urinary tract infection, site not specified: Secondary | ICD-10-CM

## 2016-09-19 DIAGNOSIS — R000 Tachycardia, unspecified: Secondary | ICD-10-CM | POA: Diagnosis present

## 2016-09-19 DIAGNOSIS — K760 Fatty (change of) liver, not elsewhere classified: Secondary | ICD-10-CM | POA: Diagnosis present

## 2016-09-19 DIAGNOSIS — Z8679 Personal history of other diseases of the circulatory system: Secondary | ICD-10-CM

## 2016-09-19 DIAGNOSIS — R109 Unspecified abdominal pain: Secondary | ICD-10-CM | POA: Diagnosis not present

## 2016-09-19 DIAGNOSIS — M797 Fibromyalgia: Secondary | ICD-10-CM | POA: Diagnosis present

## 2016-09-19 DIAGNOSIS — N3010 Interstitial cystitis (chronic) without hematuria: Secondary | ICD-10-CM | POA: Diagnosis not present

## 2016-09-19 DIAGNOSIS — E8770 Fluid overload, unspecified: Secondary | ICD-10-CM | POA: Diagnosis not present

## 2016-09-19 DIAGNOSIS — R0602 Shortness of breath: Secondary | ICD-10-CM

## 2016-09-19 DIAGNOSIS — Z885 Allergy status to narcotic agent status: Secondary | ICD-10-CM

## 2016-09-19 DIAGNOSIS — E876 Hypokalemia: Secondary | ICD-10-CM | POA: Diagnosis present

## 2016-09-19 DIAGNOSIS — Z91011 Allergy to milk products: Secondary | ICD-10-CM

## 2016-09-19 DIAGNOSIS — J181 Lobar pneumonia, unspecified organism: Secondary | ICD-10-CM | POA: Diagnosis not present

## 2016-09-19 DIAGNOSIS — R8271 Bacteriuria: Secondary | ICD-10-CM | POA: Diagnosis present

## 2016-09-19 DIAGNOSIS — K219 Gastro-esophageal reflux disease without esophagitis: Secondary | ICD-10-CM | POA: Diagnosis present

## 2016-09-19 DIAGNOSIS — G709 Myoneural disorder, unspecified: Secondary | ICD-10-CM | POA: Diagnosis present

## 2016-09-19 DIAGNOSIS — G43909 Migraine, unspecified, not intractable, without status migrainosus: Secondary | ICD-10-CM | POA: Diagnosis present

## 2016-09-19 DIAGNOSIS — Z85528 Personal history of other malignant neoplasm of kidney: Secondary | ICD-10-CM

## 2016-09-19 DIAGNOSIS — Z91048 Other nonmedicinal substance allergy status: Secondary | ICD-10-CM

## 2016-09-19 DIAGNOSIS — Z881 Allergy status to other antibiotic agents status: Secondary | ICD-10-CM

## 2016-09-19 LAB — COMPREHENSIVE METABOLIC PANEL
ALK PHOS: 64 U/L (ref 38–126)
ALT: 27 U/L (ref 14–54)
AST: 27 U/L (ref 15–41)
Albumin: 4.2 g/dL (ref 3.5–5.0)
Anion gap: 10 (ref 5–15)
BUN: 9 mg/dL (ref 6–20)
CALCIUM: 8.7 mg/dL — AB (ref 8.9–10.3)
CO2: 23 mmol/L (ref 22–32)
Chloride: 101 mmol/L (ref 101–111)
Creatinine, Ser: 0.73 mg/dL (ref 0.44–1.00)
GFR calc Af Amer: >60 mL/min (ref >60)
Glucose, Bld: 113 mg/dL — ABNORMAL HIGH (ref 65–99)
POTASSIUM: 4.1 mmol/L (ref 3.5–5.1)
SODIUM: 134 mmol/L — AB (ref 135–145)
TOTAL PROTEIN: 7.9 g/dL (ref 6.5–8.1)
Total Bilirubin: 1 mg/dL (ref 0.3–1.2)

## 2016-09-19 LAB — URINALYSIS, ROUTINE W REFLEX MICROSCOPIC
BILIRUBIN URINE: NEGATIVE
GLUCOSE, UA: NEGATIVE mg/dL
Ketones, ur: 20 mg/dL — AB
NITRITE: NEGATIVE
PH: 6 (ref 5.0–8.0)
Protein, ur: 30 mg/dL — AB
SPECIFIC GRAVITY, URINE: 1.019 (ref 1.005–1.030)

## 2016-09-19 LAB — CBC WITH DIFFERENTIAL/PLATELET
BASOS ABS: 0 10*3/uL (ref 0.0–0.1)
BASOS PCT: 0 %
EOS ABS: 0 10*3/uL (ref 0.0–0.7)
EOS PCT: 0 %
HCT: 37.8 % (ref 36.0–46.0)
Hemoglobin: 12.7 g/dL (ref 12.0–15.0)
LYMPHS PCT: 17 %
Lymphs Abs: 1.5 10*3/uL (ref 0.7–4.0)
MCH: 28.7 pg (ref 26.0–34.0)
MCHC: 33.6 g/dL (ref 30.0–36.0)
MCV: 85.5 fL (ref 78.0–100.0)
Monocytes Absolute: 0.9 10*3/uL (ref 0.1–1.0)
Monocytes Relative: 11 %
Neutro Abs: 6.3 10*3/uL (ref 1.7–7.7)
Neutrophils Relative %: 72 %
PLATELETS: 192 10*3/uL (ref 150–400)
RBC: 4.42 MIL/uL (ref 3.87–5.11)
RDW: 14 % (ref 11.5–15.5)
WBC: 8.7 10*3/uL (ref 4.0–10.5)

## 2016-09-19 LAB — LIPASE, BLOOD: Lipase: 19 U/L (ref 11–51)

## 2016-09-19 LAB — CG4 I-STAT (LACTIC ACID): Lactic Acid, Venous: 0.66 mmol/L (ref 0.5–1.9)

## 2016-09-19 MED ORDER — SODIUM CHLORIDE 0.9 % IV BOLUS (SEPSIS)
1000.0000 mL | Freq: Once | INTRAVENOUS | Status: AC
  Administered 2016-09-19: 1000 mL via INTRAVENOUS

## 2016-09-19 MED ORDER — ALBUTEROL SULFATE HFA 108 (90 BASE) MCG/ACT IN AERS: 2.0000 | INHALATION_SPRAY | Freq: Four times a day (QID) | RESPIRATORY_TRACT | Status: DC | PRN

## 2016-09-19 MED ORDER — METHOCARBAMOL 500 MG PO TABS
500.0000 mg | ORAL_TABLET | Freq: Three times a day (TID) | ORAL | Status: DC | PRN
  Administered 2016-09-20: 500 mg via ORAL
  Filled 2016-09-19: qty 1

## 2016-09-19 MED ORDER — KETOROLAC TROMETHAMINE 30 MG/ML IJ SOLN
30.0000 mg | Freq: Four times a day (QID) | INTRAMUSCULAR | Status: DC | PRN
  Administered 2016-09-19 – 2016-09-21 (×5): 30 mg via INTRAVENOUS
  Filled 2016-09-19 (×6): qty 1

## 2016-09-19 MED ORDER — DEXTROSE 5 % IV SOLN
500.0000 mg | INTRAVENOUS | Status: DC
  Administered 2016-09-20: 500 mg via INTRAVENOUS
  Filled 2016-09-19: qty 500

## 2016-09-19 MED ORDER — IOPAMIDOL (ISOVUE-300) INJECTION 61%
INTRAVENOUS | Status: AC
  Filled 2016-09-19: qty 100

## 2016-09-19 MED ORDER — DEXTROSE 5 % IV SOLN
1.0000 g | Freq: Once | INTRAVENOUS | Status: AC
  Administered 2016-09-19: 1 g via INTRAVENOUS
  Filled 2016-09-19: qty 10

## 2016-09-19 MED ORDER — IRBESARTAN 75 MG PO TABS
37.5000 mg | ORAL_TABLET | Freq: Every day | ORAL | Status: DC
  Administered 2016-09-21 – 2016-09-22 (×2): 37.5 mg via ORAL
  Filled 2016-09-19 (×3): qty 1

## 2016-09-19 MED ORDER — DULOXETINE HCL 30 MG PO CPEP: 60.0000 mg | ORAL_CAPSULE | Freq: Every day | ORAL | Status: DC

## 2016-09-19 MED ORDER — DULOXETINE HCL 30 MG PO CPEP
90.0000 mg | ORAL_CAPSULE | Freq: Every day | ORAL | Status: DC
  Administered 2016-09-20 – 2016-09-22 (×3): 90 mg via ORAL
  Filled 2016-09-19 (×3): qty 3

## 2016-09-19 MED ORDER — VITAMIN D 1000 UNITS PO TABS
2000.0000 [IU] | ORAL_TABLET | Freq: Every day | ORAL | Status: DC
  Administered 2016-09-20 – 2016-09-22 (×3): 2000 [IU] via ORAL
  Filled 2016-09-19 (×3): qty 2

## 2016-09-19 MED ORDER — PANTOPRAZOLE SODIUM 40 MG PO TBEC
40.0000 mg | DELAYED_RELEASE_TABLET | Freq: Every day | ORAL | Status: DC
  Administered 2016-09-20 – 2016-09-22 (×3): 40 mg via ORAL
  Filled 2016-09-19 (×3): qty 1

## 2016-09-19 MED ORDER — TAPENTADOL HCL 50 MG PO TABS: 25.0000 mg | ORAL_TABLET | Freq: Every day | ORAL | Status: DC | PRN

## 2016-09-19 MED ORDER — LEVOTHYROXINE SODIUM 125 MCG PO TABS
125.0000 ug | ORAL_TABLET | ORAL | Status: DC
  Administered 2016-09-22: 125 ug via ORAL
  Filled 2016-09-19 (×2): qty 1

## 2016-09-19 MED ORDER — ENSURE ENLIVE PO LIQD: 237.0000 mL | Freq: Two times a day (BID) | ORAL | Status: DC

## 2016-09-19 MED ORDER — ONDANSETRON HCL 4 MG/2ML IJ SOLN
4.0000 mg | Freq: Once | INTRAMUSCULAR | Status: AC
  Administered 2016-09-19: 4 mg via INTRAVENOUS
  Filled 2016-09-19: qty 2

## 2016-09-19 MED ORDER — IOPAMIDOL (ISOVUE-300) INJECTION 61%
100.0000 mL | Freq: Once | INTRAVENOUS | Status: AC | PRN
  Administered 2016-09-19: 80 mL via INTRAVENOUS

## 2016-09-19 MED ORDER — LEVOTHYROXINE SODIUM 112 MCG PO TABS
112.0000 ug | ORAL_TABLET | ORAL | Status: DC
  Administered 2016-09-21: 112 ug via ORAL
  Filled 2016-09-19: qty 1

## 2016-09-19 MED ORDER — ALBUTEROL SULFATE (2.5 MG/3ML) 0.083% IN NEBU
2.5000 mg | INHALATION_SOLUTION | Freq: Four times a day (QID) | RESPIRATORY_TRACT | Status: DC | PRN
  Administered 2016-09-20: 2.5 mg via RESPIRATORY_TRACT
  Filled 2016-09-19: qty 3

## 2016-09-19 MED ORDER — DEXTROSE 5 % IV SOLN
500.0000 mg | Freq: Once | INTRAVENOUS | Status: AC
  Administered 2016-09-19: 500 mg via INTRAVENOUS
  Filled 2016-09-19: qty 500

## 2016-09-19 MED ORDER — ACETAMINOPHEN 325 MG PO TABS
650.0000 mg | ORAL_TABLET | Freq: Four times a day (QID) | ORAL | Status: DC | PRN
  Administered 2016-09-19 – 2016-09-21 (×5): 650 mg via ORAL
  Filled 2016-09-19 (×5): qty 2

## 2016-09-19 MED ORDER — ENOXAPARIN SODIUM 40 MG/0.4ML ~~LOC~~ SOLN
40.0000 mg | SUBCUTANEOUS | Status: DC
  Administered 2016-09-19 – 2016-09-21 (×3): 40 mg via SUBCUTANEOUS
  Filled 2016-09-19 (×3): qty 0.4

## 2016-09-19 MED ORDER — DEXTROSE 5 % IV SOLN
1.0000 g | INTRAVENOUS | Status: DC
  Administered 2016-09-20 – 2016-09-21 (×2): 1 g via INTRAVENOUS
  Filled 2016-09-19 (×2): qty 10

## 2016-09-19 MED ORDER — SODIUM CHLORIDE 0.9 % IV SOLN
INTRAVENOUS | Status: AC
  Administered 2016-09-19 – 2016-09-20 (×2): via INTRAVENOUS

## 2016-09-19 MED ORDER — KETOROLAC TROMETHAMINE 30 MG/ML IJ SOLN
30.0000 mg | Freq: Once | INTRAMUSCULAR | Status: AC
  Administered 2016-09-19: 30 mg via INTRAVENOUS
  Filled 2016-09-19: qty 1

## 2016-09-19 MED ORDER — ONDANSETRON HCL 4 MG/2ML IJ SOLN: 4.0000 mg | Freq: Four times a day (QID) | INTRAMUSCULAR | Status: DC | PRN

## 2016-09-19 NOTE — ED Triage Notes (Signed)
Pt reports fever x 3 days, epigastric pain, weakness.  Hx cancer . No chemo nor radiation. Alert and oriented x 4. Took 325 mg x 2  tylenol around 10 AM today. denies nausea.

## 2016-09-19 NOTE — H&P (Signed)
History and Physical    Angel Sellers:[id number] DOB: 12-19-1960 DOA: 09/19/2016  PCP: Lavone Orn, MD   Patient coming from: Home  Chief Complaint: Fevers, abdominal pain, nausea  HPI: Angel Sellers is a 56 y.o. female with medical history significant for hypertension, hypothyroidism, and migraines who presents to the ED with 3-4 days of fevers, nausea, and abdominal pain. She reports pain in the mid- to upper-abdomen, described as sharp, constant but waxing and waning, radiating through to her back, and without alleviating or exacerbating factors identified. She reports nausea, but no vomiting. She had two loose stools when symptoms started, but no further diarrhea. No recent antibiotics. No history of MDR organisms.   ED Course: Upon arrival to the ED, patient is found to be febrile, tachycardic in 120's, and with vitals otherwise stable. Chemistry panel and CBC are unremarkable. Lactic acid is reassuringly low. CXR features a LLL opacity concerning for pneumonia. Abdominal US notable only for suspected fatty liver. UA suggests possible UTI. CT abdomen/pelvis with fatty liver infiltration and no other acute abnormalities. Blood cultures were collected, 2 liters of NS given, and empiric Rocephin and azithromycin started. Tachycardia resolved with the IVF, BP remained stable, there has not been any appreciable respiratory distress, and she will be observed on the medical-surgical unit for ongoing evaluation and management of fevers with abdominal pain and nausea, secondary to CAP and likely UTI.   Review of Systems:  All other systems reviewed and apart from HPI, are negative.  Past Medical History:  Diagnosis Date  . Arthritis   . Cancer (HCC)    renal  . Chronic fatigue   . Fibromyalgia   . GERD (gastroesophageal reflux disease)   . Headache   . History of kidney cancer   . Hypertension   . Hypothyroidism   . Interstitial cystitis   . Neuromuscular disorder ([place])    nerve  .  PONV (postoperative nausea and vomiting)    need scope patch  . POTS (postural orthostatic tachycardia syndrome)   . Thyroid disease     Past Surgical History:  Procedure Laterality Date  . ABDOMINAL HYSTERECTOMY  1999  . BONE BIOPSY Right 05/01/2013   Procedure: BIOPSY NAIL BED RIGHT THUMB, BIOPSY RIGHT GREAT TOE ;  Surgeon: Wynonia Sours, MD;  Location: [place];  Service: Orthopedics;  Laterality: Right;  . CESAREAN SECTION     2 c-cestions  . COLONOSCOPY    . CYSTOSCOPY    . DISTAL INTERPHALANGEAL JOINT FUSION Left 02/26/2016   Procedure: LEFT 5TH TOE DISTAL INTERPHALANGEAL JOINT FUSION;  Surgeon: Newt Minion, MD;  Location: [place];  Service: Orthopedics;  Laterality: Left;  . INCISIONAL HERNIA REPAIR N/A 03/31/2016   Procedure: LAPAROSCOPIC ASSISTED REPAIR OF INCISIONAL HERNIA;  Surgeon: Greer Pickerel, MD;  Location: [facility];  Service: General;  Laterality: N/A;  . INSERTION OF MESH N/A 03/31/2016   Procedure: INSERTION OF MESH;  Surgeon: Greer Pickerel, MD;  Location: [facility];  Service: General;  Laterality: N/A;  . KIDNEY SURGERY  2011   rt partial nephrectomy-[facility]  . TONSILLECTOMY       reports that she has never smoked. She has never used smokeless tobacco. She reports that she does not drink alcohol or use drugs.  Allergies  Allergen Reactions  . Dilaudid [Hydromorphone Hcl] Other (See Comments)    hypotension  . Ciprofloxacin Itching, Rash and Other (See Comments)    Chest tightness/severe vertigo  . Lactose Intolerance (Gi) Other (  See Comments)    GI upset  . Meperidine Nausea And Vomiting  . Morphine Hives and Rash  . Tape Rash    Family History  Problem Relation Age of Onset  . Melanoma Mother      Prior to Admission medications   Medication Sig Start Date End Date Taking? Authorizing Provider  albuterol (PROVENTIL HFA;VENTOLIN HFA) 108 (90 Base) MCG/ACT inhaler Inhale 2 puffs into the lungs every 6 (six) hours as needed for wheezing or shortness  of breath.   Yes [provider]  amphetamine-dextroamphetamine (ADDERALL XR) 20 MG 24 hr capsule Take 20 mg by mouth daily.   Yes [provider]  Cholecalciferol (VITAMIN D) 2000 units tablet Take 2,000 Units by mouth daily.   Yes [provider]  DULoxetine (CYMBALTA) 30 MG capsule Take 30 mg by mouth daily.   Yes [provider]  DULoxetine (CYMBALTA) 60 MG capsule Take 60 mg by mouth daily.   Yes [provider]  levothyroxine (SYNTHROID, LEVOTHROID) 112 MCG tablet Take 112 mcg by mouth every other day.  11/22/15  Yes [provider]  levothyroxine (SYNTHROID, LEVOTHROID) 125 MCG tablet Take 125 mcg by mouth every other day.  10/10/15  Yes [provider]  methocarbamol (ROBAXIN) 500 MG tablet Take 500 mg by mouth 3 (three) times daily as needed (for muscle spasm/pain.).    Yes [provider]  olmesartan (BENICAR) 5 MG tablet Take 5 mg by mouth daily.  11/22/15  Yes [provider]  omeprazole (PRILOSEC) 40 MG capsule Take 40 mg by mouth daily.   Yes [provider]  promethazine (PHENERGAN) 25 MG tablet Take 25 mg by mouth every 6 (six) hours as needed (for nausea/vomiting with migraines.).  02/06/12  Yes [provider]  rizatriptan (MAXALT) 10 MG tablet Take 10 mg by mouth daily as needed for migraine. May repeat in 2 hours if needed   Yes [provider]  tapentadol (NUCYNTA) 50 MG TABS tablet Take 25 mg by mouth daily as needed (for pain due to exertion/falls).    Yes [provider]    Physical Exam: Vitals:   09/19/16 1349 09/19/16 1800 09/19/16 1944  BP: 139/88  110/71  Pulse: (!) 120  96  Resp: 20  19  Temp: (!) 103 F (39.4 C) (!) 102.9 F (39.4 C) 99.8 F (37.7 C)  TempSrc: Oral  Oral  SpO2: 96%  94%  Weight: 83 kg (183 lb)    Height: 5\' 5"  (1.651 m)        Constitutional: NAD, calm, in apparent discomfort Eyes: PERTLA, lids and conjunctivae  normal ENMT: Mucous membranes are moist. Posterior pharynx clear of any exudate or lesions.   Neck: normal, supple, no masses, no thyromegaly Respiratory: Bibasilar rhonchi. Normal respiratory effort. No accessory muscle use.  Cardiovascular: S1 & S2 heard, regular rate and rhythm. No extremity edema. No significant JVD. Abdomen: No distension, tender generally without rebound pain or guarding. Bowel sounds normal.  Musculoskeletal: no clubbing / cyanosis. No joint deformity upper and lower extremities.   Skin: no significant rashes, lesions, ulcers. Warm, dry, well-perfused. Neurologic: CN 2-12 grossly intact. Sensation intact, DTR normal. Strength 5/5 in all 4 limbs.  Psychiatric: Alert and oriented x 3. Calm and cooperative.      Labs on Admission: I have personally reviewed following labs and imaging studies  CBC:  Recent Labs Lab 09/19/16 1355  WBC 8.7  NEUTROABS 6.3  HGB 12.7  HCT 37.8  MCV  85.5  PLT 409   Basic Metabolic Panel:  Recent Labs Lab 09/19/16 1355  NA 134*  K 4.1  CL 101  CO2 23  GLUCOSE 113*  BUN 9  CREATININE 0.73  CALCIUM 8.7*   GFR: Estimated Creatinine Clearance: 83.5 mL/min (by C-G formula based on SCr of 0.73 mg/dL). Liver Function Tests:  Recent Labs Lab 09/19/16 1355  AST 27  ALT 27  ALKPHOS 64  BILITOT 1.0  PROT 7.9  ALBUMIN 4.2    Recent Labs Lab 09/19/16 1355  LIPASE 19   No results for input(s): AMMONIA in the last 168 hours. Coagulation Profile: No results for input(s): INR, PROTIME in the last 168 hours. Cardiac Enzymes: No results for input(s): CKTOTAL, CKMB, CKMBINDEX, TROPONINI in the last 168 hours. BNP (last 3 results) No results for input(s): PROBNP in the last 8760 hours. HbA1C: No results for input(s): HGBA1C in the last 72 hours. CBG: No results for input(s): GLUCAP in the last 168 hours. Lipid Profile: No results for input(s): CHOL, HDL, LDLCALC, TRIG, CHOLHDL, LDLDIRECT in the last 72 hours. Thyroid  Function Tests: No results for input(s): TSH, T4TOTAL, FREET4, T3FREE, THYROIDAB in the last 72 hours. Anemia Panel: No results for input(s): VITAMINB12, FOLATE, FERRITIN, TIBC, IRON, RETICCTPCT in the last 72 hours. Urine analysis:    Component Value Date/Time   COLORURINE YELLOW 09/19/2016 1714   APPEARANCEUR HAZY (A) 09/19/2016 1714   LABSPEC 1.019 09/19/2016 1714   PHURINE 6.0 09/19/2016 1714   GLUCOSEU NEGATIVE 09/19/2016 1714   HGBUR SMALL (A) 09/19/2016 1714   BILIRUBINUR NEGATIVE 09/19/2016 1714   KETONESUR 20 (A) 09/19/2016 1714   PROTEINUR 30 (A) 09/19/2016 1714   UROBILINOGEN 0.2 05/04/2011 1937   NITRITE NEGATIVE 09/19/2016 1714   LEUKOCYTESUR LARGE (A) 09/19/2016 1714   Sepsis Labs: @LABRCNTIP (procalcitonin:4,lacticidven:4) )No results found for this or any previous visit (from the past 240 hour(s)).   Radiological Exams on Admission: Dg Chest 2 View  Result Date: 09/19/2016 CLINICAL DATA:  Concern for pneumonia. EXAM: CHEST  2 VIEW COMPARISON:  Chest x-ray dated February 14, 2015. FINDINGS: The cardiomediastinal silhouette is normal in size. Normal pulmonary vascularity. There is silhouetting of the left hemidiaphragm with subtle increased since in the left lower lobe on the lateral view. No pleural effusion or pneumothorax. No acute osseous abnormality. IMPRESSION: Increased left lower lobe density, concerning for pneumonia. Electronically Signed   By: Titus Dubin M.D.   On: 09/19/2016 14:37   Ct Abdomen Pelvis W Contrast  Result Date: 09/19/2016 CLINICAL DATA:  Abdominal pain, fever and weakness. EXAM: CT ABDOMEN AND PELVIS WITH CONTRAST TECHNIQUE: Multidetector CT imaging of the abdomen and pelvis was performed using the standard protocol following bolus administration of intravenous contrast. CONTRAST:  71mL ISOVUE-300 IOPAMIDOL (ISOVUE-300) INJECTION 61% COMPARISON:  Abdominal ultrasound 09/19/2016. FINDINGS: Lower chest: Left lower lobe infiltrate. No definite  pleural effusion. There is a 5.5 mm left lower lobe pulmonary nodule, unchanged since prior CT scan 2016. The right lung base is clear. The heart is normal in size. No pericardial effusion. Hepatobiliary: Diffuse fatty infiltration of the liver. Small low-attenuation lesion in the gallbladder stable and consistent with benign cysts. No worrisome hepatic lesions or intrahepatic biliary dilatation. The portal and hepatic veins are patent. The gallbladder is normal. No common bile duct dilatation. Pancreas: No mass, inflammation or ductal dilatation. Spleen: Normal size. Stable low-attenuation lesion, likely benign cyst or hemangioma. Adrenals/Urinary Tract: The adrenal glands and kidneys are unremarkable. There are remote surgical changes  involving the right kidney. No renal lesions or hydronephrosis. No ureteral or bladder calculi. Stomach/Bowel: The stomach, duodenum, small bowel and colon are unremarkable. The terminal ileum and appendix are normal. No acute inflammatory process, mass lesions or obstructive findings. Vascular/Lymphatic: The aorta is normal in caliber. No dissection. The branch vessels are patent. The major venous structures are patent. No mesenteric or retroperitoneal mass or adenopathy. Small scattered lymph nodes are noted. Reproductive: Surgically absent. Other: No pelvic mass or adenopathy. No free pelvic fluid collections. No inguinal mass or adenopathy. No abdominal wall hernia or subcutaneous lesions. Musculoskeletal: No significant bony findings. IMPRESSION: 1. Left lower lobe infiltrate. 2. Diffuse fatty infiltration of the liver. 3. No acute abdominal/pelvic findings, mass lesions or lymphadenopathy. Electronically Signed   By: Marijo Sanes M.D.   On: 09/19/2016 19:44   US Abdomen Limited  Result Date: 09/19/2016 CLINICAL DATA:  Right upper quadrant pain. EXAM: ULTRASOUND ABDOMEN LIMITED RIGHT UPPER QUADRANT COMPARISON:  CT scan abdomen and pelvis dated 09/29/2014 FINDINGS:  Gallbladder: No gallstones or wall thickening visualized. No sonographic Murphy sign noted by sonographer. Common bile duct: Diameter: 3.1 mm, normal. Liver: 1.3 cm cyst in right lobe of the liver adjacent to the gallbladder. Diffuse increased echogenicity consistent with hepatic steatosis as demonstrated on the prior CT scan. Focal fatty sparing of the liver in the posterior aspect of the left lobe. IMPRESSION: 1. Hepatic steatosis. 2. No acute abnormalities. Electronically Signed   By: Lorriane Shire M.D.   On: 09/19/2016 17:22    EKG: Not performed.   Assessment/Plan  1. CAP, ?UTI  - Pt presents with fevers, nausea, abdominal pain  - UA suggests possible infection, possibly explaining the abd pain and nausea with unremarkable CT abd/pelvis  - CXR and CT-findings are consistent with a LLL PNA  - Blood cultures collected in ED, 2 liters NS given, and empiric Rocephin and azithromycin initiated  - Plan to add sputum culture and gram-stain, strep pneumo and legionella antigens, continue empiric abx, supportive care with antipyretics, prn supplemental O2, IVF hydration    2. Hypothyroidism  - Appears stable, continue Synthroid   3. Hypertension  - BP is at goal, will continue ARB    DVT prophylaxis: sq Lovenox  Code Status: Full  Family Communication: Husband updated at bedside  Disposition Plan: Observe on med-surg Consults called: None Admission status: Observation    Vianne Bulls, MD [facility] Pager [phone]  If 7PM-7AM, please contact night-coverage [email or web address] Password [email or web address]  09/19/2016, 8:14 PM

## 2016-09-20 DIAGNOSIS — N390 Urinary tract infection, site not specified: Secondary | ICD-10-CM | POA: Diagnosis not present

## 2016-09-20 DIAGNOSIS — E039 Hypothyroidism, unspecified: Secondary | ICD-10-CM | POA: Diagnosis not present

## 2016-09-20 DIAGNOSIS — G43809 Other migraine, not intractable, without status migrainosus: Secondary | ICD-10-CM | POA: Diagnosis not present

## 2016-09-20 DIAGNOSIS — J181 Lobar pneumonia, unspecified organism: Secondary | ICD-10-CM | POA: Diagnosis not present

## 2016-09-20 DIAGNOSIS — I10 Essential (primary) hypertension: Secondary | ICD-10-CM | POA: Diagnosis not present

## 2016-09-20 LAB — CBC WITH DIFFERENTIAL/PLATELET
BASOS ABS: 0 10*3/uL (ref 0.0–0.1)
BASOS PCT: 0 %
EOS ABS: 0 10*3/uL (ref 0.0–0.7)
EOS PCT: 1 %
HCT: 34.7 % — ABNORMAL LOW (ref 36.0–46.0)
Hemoglobin: 11.7 g/dL — ABNORMAL LOW (ref 12.0–15.0)
LYMPHS PCT: 19 %
Lymphs Abs: 1.2 10*3/uL (ref 0.7–4.0)
MCH: 29.3 pg (ref 26.0–34.0)
MCHC: 33.7 g/dL (ref 30.0–36.0)
MCV: 86.8 fL (ref 78.0–100.0)
MONO ABS: 0.9 10*3/uL (ref 0.1–1.0)
Monocytes Relative: 14 %
Neutro Abs: 4.2 10*3/uL (ref 1.7–7.7)
Neutrophils Relative %: 66 %
PLATELETS: 182 10*3/uL (ref 150–400)
RBC: 4 MIL/uL (ref 3.87–5.11)
RDW: 14.3 % (ref 11.5–15.5)
WBC: 6.3 10*3/uL (ref 4.0–10.5)

## 2016-09-20 LAB — BASIC METABOLIC PANEL
Anion gap: 8 (ref 5–15)
BUN: 8 mg/dL (ref 6–20)
CALCIUM: 8.1 mg/dL — AB (ref 8.9–10.3)
CHLORIDE: 105 mmol/L (ref 101–111)
CO2: 26 mmol/L (ref 22–32)
CREATININE: 0.76 mg/dL (ref 0.44–1.00)
GFR calc Af Amer: >60 mL/min (ref >60)
Glucose, Bld: 109 mg/dL — ABNORMAL HIGH (ref 65–99)
POTASSIUM: 3.6 mmol/L (ref 3.5–5.1)
SODIUM: 139 mmol/L (ref 135–145)

## 2016-09-20 LAB — STREP PNEUMONIAE URINARY ANTIGEN: Strep Pneumo Urinary Antigen: NEGATIVE

## 2016-09-20 LAB — HIV ANTIBODY (ROUTINE TESTING W REFLEX): HIV Screen 4th Generation wRfx: NONREACTIVE

## 2016-09-20 LAB — PROCALCITONIN

## 2016-09-20 MED ORDER — SUMATRIPTAN SUCCINATE 50 MG PO TABS
50.0000 mg | ORAL_TABLET | Freq: Every day | ORAL | Status: DC | PRN
  Administered 2016-09-20: 50 mg via ORAL
  Filled 2016-09-20: qty 1

## 2016-09-20 MED ORDER — ALBUTEROL SULFATE (2.5 MG/3ML) 0.083% IN NEBU
2.5000 mg | INHALATION_SOLUTION | Freq: Three times a day (TID) | RESPIRATORY_TRACT | Status: DC
  Administered 2016-09-20 – 2016-09-21 (×3): 2.5 mg via RESPIRATORY_TRACT
  Filled 2016-09-20 (×5): qty 3

## 2016-09-20 MED ORDER — TAPENTADOL HCL 50 MG PO TABS: 25.0000 mg | ORAL_TABLET | Freq: Every day | ORAL | Status: DC | PRN

## 2016-09-20 NOTE — Progress Notes (Signed)
Patient ID: Angel Sellers, female   DOB: 1961-01-10, 56 y.o.   MRN: [id number]  PROGRESS NOTE    Angel Sellers  Angel Sellers DOB: 09-18-1960 DOA: 09/19/2016 PCP: Lavone Orn, MD   Brief Narrative:  56 year female with history of hypertension, hypothyroidism and migraines presented with fever and was found to have pneumonia and likely UTI. She was started on intravenous antibiotics.  Assessment & Plan:   Principal Problem:   CAP (community acquired pneumonia) Active Problems:   Essential hypertension   Hypothyroidism   CAP - Continue Rocephin and Zithromax. Follow cultures. Oxygen supplementation if needed.   Probable UTI - Continue Rocephin. Follow culture  Chronic migraine - Patient is complaining of severe headache currently. She uses Rizatriptan at home. Will use Imitrex PRN for migraine.  Hypothyroidism  continue Synthroid   3. Hypertension  continue ARB. Monitor BP   DVT prophylaxis: sq Lovenox  Code Status: Full  Family Communication:  none at bedside Disposition Plan:  home in 1-2 days Consultants: None Procedures: None  Antimicrobials: Rocephin and Zithromax from 09/19/2016    Subjective: Patient seen and examined at bedside. She complains of headache similar to her migraine headaches. She also complains of cough. No overnight fever or vomiting.  Objective: Vitals:   09/19/16 2100 09/19/16 2144 09/20/16 0145 09/20/16 0528  BP:  (!) 86/54  101/61  Pulse:  93  84  Resp:  16  16  Temp: 98.9 F (37.2 C) 98.4 F (36.9 C) 98.1 F (36.7 C) 99.2 F (37.3 C)  TempSrc: Oral Oral Oral Oral  SpO2:  97%  98%  Weight:  83 kg (183 lb)    Height:  5\' 5"  (1.651 m)      Intake/Output Summary (Last 24 hours) at 09/20/16 1002 Last data filed at 09/20/16 0413  Gross per 24 hour  Intake          4384.58 ml  Output                0 ml  Net          4384.58 ml   Filed Weights   09/19/16 1349 09/19/16 2144  Weight: 83 kg (183 lb) 83 kg (183 lb)     Examination:  General exam: Appears In the moderate distress secondary to headache Respiratory system: Bilateral decreased breath sound at basesWith some scattered crackles Cardiovascular system: S1 & S2 heard, rate controlled  Gastrointestinal system: Abdomen is nondistended, soft and nontender. Normal bowel sounds heard. Central nervous system: Alert and oriented. No focal neurological deficits. Moving extremities Extremities: No cyanosis, clubbing, edema      Data Reviewed: I have personally reviewed following labs and imaging studies  CBC:  Recent Labs Lab 09/19/16 1355 09/20/16 0515  WBC 8.7 6.3  NEUTROABS 6.3 4.2  HGB 12.7 11.7*  HCT 37.8 34.7*  MCV 85.5 86.8  PLT 192 856   Basic Metabolic Panel:  Recent Labs Lab 09/19/16 1355 09/20/16 0515  NA 134* 139  K 4.1 3.6  CL 101 105  CO2 23 26  GLUCOSE 113* 109*  BUN 9 8  CREATININE 0.73 0.76  CALCIUM 8.7* 8.1*   GFR: Estimated Creatinine Clearance: 83.5 mL/min (by C-G formula based on SCr of 0.76 mg/dL). Liver Function Tests:  Recent Labs Lab 09/19/16 1355  AST 27  ALT 27  ALKPHOS 64  BILITOT 1.0  PROT 7.9  ALBUMIN 4.2    Recent Labs Lab 09/19/16 1355  LIPASE 19   No results  for input(s): AMMONIA in the last 168 hours. Coagulation Profile: No results for input(s): INR, PROTIME in the last 168 hours. Cardiac Enzymes: No results for input(s): CKTOTAL, CKMB, CKMBINDEX, TROPONINI in the last 168 hours. BNP (last 3 results) No results for input(s): PROBNP in the last 8760 hours. HbA1C: No results for input(s): HGBA1C in the last 72 hours. CBG: No results for input(s): GLUCAP in the last 168 hours. Lipid Profile: No results for input(s): CHOL, HDL, LDLCALC, TRIG, CHOLHDL, LDLDIRECT in the last 72 hours. Thyroid Function Tests: No results for input(s): TSH, T4TOTAL, FREET4, T3FREE, THYROIDAB in the last 72 hours. Anemia Panel: No results for input(s): VITAMINB12, FOLATE, FERRITIN, TIBC,  IRON, RETICCTPCT in the last 72 hours. Sepsis Labs:  Recent Labs Lab 09/19/16 1602 09/20/16 0515  PROCALCITON  --  <0.10  LATICACIDVEN 0.66  --     No results found for this or any previous visit (from the past 240 hour(s)).       Radiology Studies: Dg Chest 2 View  Result Date: 09/19/2016 CLINICAL DATA:  Concern for pneumonia. EXAM: CHEST  2 VIEW COMPARISON:  Chest x-ray dated February 14, 2015. FINDINGS: The cardiomediastinal silhouette is normal in size. Normal pulmonary vascularity. There is silhouetting of the left hemidiaphragm with subtle increased since in the left lower lobe on the lateral view. No pleural effusion or pneumothorax. No acute osseous abnormality. IMPRESSION: Increased left lower lobe density, concerning for pneumonia. Electronically Signed   By: Titus Dubin M.D.   On: 09/19/2016 14:37   Ct Abdomen Pelvis W Contrast  Result Date: 09/19/2016 CLINICAL DATA:  Abdominal pain, fever and weakness. EXAM: CT ABDOMEN AND PELVIS WITH CONTRAST TECHNIQUE: Multidetector CT imaging of the abdomen and pelvis was performed using the standard protocol following bolus administration of intravenous contrast. CONTRAST:  79mL ISOVUE-300 IOPAMIDOL (ISOVUE-300) INJECTION 61% COMPARISON:  Abdominal ultrasound 09/19/2016. FINDINGS: Lower chest: Left lower lobe infiltrate. No definite pleural effusion. There is a 5.5 mm left lower lobe pulmonary nodule, unchanged since prior CT scan 2016. The right lung base is clear. The heart is normal in size. No pericardial effusion. Hepatobiliary: Diffuse fatty infiltration of the liver. Small low-attenuation lesion in the gallbladder stable and consistent with benign cysts. No worrisome hepatic lesions or intrahepatic biliary dilatation. The portal and hepatic veins are patent. The gallbladder is normal. No common bile duct dilatation. Pancreas: No mass, inflammation or ductal dilatation. Spleen: Normal size. Stable low-attenuation lesion, likely benign  cyst or hemangioma. Adrenals/Urinary Tract: The adrenal glands and kidneys are unremarkable. There are remote surgical changes involving the right kidney. No renal lesions or hydronephrosis. No ureteral or bladder calculi. Stomach/Bowel: The stomach, duodenum, small bowel and colon are unremarkable. The terminal ileum and appendix are normal. No acute inflammatory process, mass lesions or obstructive findings. Vascular/Lymphatic: The aorta is normal in caliber. No dissection. The branch vessels are patent. The major venous structures are patent. No mesenteric or retroperitoneal mass or adenopathy. Small scattered lymph nodes are noted. Reproductive: Surgically absent. Other: No pelvic mass or adenopathy. No free pelvic fluid collections. No inguinal mass or adenopathy. No abdominal wall hernia or subcutaneous lesions. Musculoskeletal: No significant bony findings. IMPRESSION: 1. Left lower lobe infiltrate. 2. Diffuse fatty infiltration of the liver. 3. No acute abdominal/pelvic findings, mass lesions or lymphadenopathy. Electronically Signed   By: Marijo Sanes M.D.   On: 09/19/2016 19:44   US Abdomen Limited  Result Date: 09/19/2016 CLINICAL DATA:  Right upper quadrant pain. EXAM: ULTRASOUND ABDOMEN LIMITED RIGHT  UPPER QUADRANT COMPARISON:  CT scan abdomen and pelvis dated 09/29/2014 FINDINGS: Gallbladder: No gallstones or wall thickening visualized. No sonographic Murphy sign noted by sonographer. Common bile duct: Diameter: 3.1 mm, normal. Liver: 1.3 cm cyst in right lobe of the liver adjacent to the gallbladder. Diffuse increased echogenicity consistent with hepatic steatosis as demonstrated on the prior CT scan. Focal fatty sparing of the liver in the posterior aspect of the left lobe. IMPRESSION: 1. Hepatic steatosis. 2. No acute abnormalities. Electronically Signed   By: Lorriane Shire M.D.   On: 09/19/2016 17:22        Scheduled Meds: . cholecalciferol  2,000 Units Oral Daily  . DULoxetine  90 mg  Oral Daily  . enoxaparin (LOVENOX) injection  40 mg Subcutaneous Q24H  . feeding supplement (ENSURE ENLIVE)  237 mL Oral BID BM  . irbesartan  37.5 mg Oral Daily  . [START ON 09/21/2016] levothyroxine  112 mcg Oral QODAY  . levothyroxine  125 mcg Oral QODAY  . pantoprazole  40 mg Oral Daily   Continuous Infusions: . azithromycin    . cefTRIAXone (ROCEPHIN)  IV       LOS: 0 days        Aline August, MD [facility] Pager [phone]  If 7PM-7AM, please contact night-coverage [email or web address] Password [email or web address] 09/20/2016, 10:02 AM

## 2016-09-21 DIAGNOSIS — Z85528 Personal history of other malignant neoplasm of kidney: Secondary | ICD-10-CM | POA: Diagnosis not present

## 2016-09-21 DIAGNOSIS — J181 Lobar pneumonia, unspecified organism: Secondary | ICD-10-CM

## 2016-09-21 DIAGNOSIS — Z91048 Other nonmedicinal substance allergy status: Secondary | ICD-10-CM | POA: Diagnosis not present

## 2016-09-21 DIAGNOSIS — E8770 Fluid overload, unspecified: Secondary | ICD-10-CM | POA: Diagnosis not present

## 2016-09-21 DIAGNOSIS — E039 Hypothyroidism, unspecified: Secondary | ICD-10-CM

## 2016-09-21 DIAGNOSIS — G43909 Migraine, unspecified, not intractable, without status migrainosus: Secondary | ICD-10-CM

## 2016-09-21 DIAGNOSIS — E876 Hypokalemia: Secondary | ICD-10-CM | POA: Clinically undetermined

## 2016-09-21 DIAGNOSIS — Z881 Allergy status to other antibiotic agents status: Secondary | ICD-10-CM | POA: Diagnosis not present

## 2016-09-21 DIAGNOSIS — K219 Gastro-esophageal reflux disease without esophagitis: Secondary | ICD-10-CM | POA: Diagnosis present

## 2016-09-21 DIAGNOSIS — J189 Pneumonia, unspecified organism: Secondary | ICD-10-CM | POA: Diagnosis present

## 2016-09-21 DIAGNOSIS — N3010 Interstitial cystitis (chronic) without hematuria: Secondary | ICD-10-CM | POA: Diagnosis present

## 2016-09-21 DIAGNOSIS — Z79899 Other long term (current) drug therapy: Secondary | ICD-10-CM | POA: Diagnosis not present

## 2016-09-21 DIAGNOSIS — R000 Tachycardia, unspecified: Secondary | ICD-10-CM | POA: Diagnosis present

## 2016-09-21 DIAGNOSIS — G709 Myoneural disorder, unspecified: Secondary | ICD-10-CM | POA: Diagnosis present

## 2016-09-21 DIAGNOSIS — R0602 Shortness of breath: Secondary | ICD-10-CM | POA: Diagnosis not present

## 2016-09-21 DIAGNOSIS — M797 Fibromyalgia: Secondary | ICD-10-CM | POA: Diagnosis present

## 2016-09-21 DIAGNOSIS — R079 Chest pain, unspecified: Secondary | ICD-10-CM | POA: Diagnosis not present

## 2016-09-21 DIAGNOSIS — Z8679 Personal history of other diseases of the circulatory system: Secondary | ICD-10-CM | POA: Diagnosis not present

## 2016-09-21 DIAGNOSIS — Z91011 Allergy to milk products: Secondary | ICD-10-CM | POA: Diagnosis not present

## 2016-09-21 DIAGNOSIS — R8271 Bacteriuria: Secondary | ICD-10-CM | POA: Diagnosis present

## 2016-09-21 DIAGNOSIS — I10 Essential (primary) hypertension: Secondary | ICD-10-CM | POA: Diagnosis not present

## 2016-09-21 DIAGNOSIS — M1990 Unspecified osteoarthritis, unspecified site: Secondary | ICD-10-CM | POA: Diagnosis present

## 2016-09-21 DIAGNOSIS — K760 Fatty (change of) liver, not elsewhere classified: Secondary | ICD-10-CM | POA: Diagnosis present

## 2016-09-21 DIAGNOSIS — Z885 Allergy status to narcotic agent status: Secondary | ICD-10-CM | POA: Diagnosis not present

## 2016-09-21 LAB — BASIC METABOLIC PANEL
ANION GAP: 8 (ref 5–15)
BUN: 6 mg/dL (ref 6–20)
CO2: 26 mmol/L (ref 22–32)
Calcium: 8.4 mg/dL — ABNORMAL LOW (ref 8.9–10.3)
Chloride: 106 mmol/L (ref 101–111)
Creatinine, Ser: 0.68 mg/dL (ref 0.44–1.00)
GFR calc Af Amer: >60 mL/min (ref >60)
GLUCOSE: 109 mg/dL — AB (ref 65–99)
POTASSIUM: 3.3 mmol/L — AB (ref 3.5–5.1)
Sodium: 140 mmol/L (ref 135–145)

## 2016-09-21 LAB — LEGIONELLA PNEUMOPHILA SEROGP 1 UR AG: L. pneumophila Serogp 1 Ur Ag: NEGATIVE

## 2016-09-21 LAB — CBC WITH DIFFERENTIAL/PLATELET
BASOS ABS: 0 10*3/uL (ref 0.0–0.1)
Basophils Relative: 1 %
EOS PCT: 2 %
Eosinophils Absolute: 0.1 10*3/uL (ref 0.0–0.7)
HEMATOCRIT: 31.7 % — AB (ref 36.0–46.0)
Hemoglobin: 10.8 g/dL — ABNORMAL LOW (ref 12.0–15.0)
LYMPHS ABS: 1.2 10*3/uL (ref 0.7–4.0)
LYMPHS PCT: 29 %
MCH: 28.8 pg (ref 26.0–34.0)
MCHC: 34.1 g/dL (ref 30.0–36.0)
MCV: 84.5 fL (ref 78.0–100.0)
MONO ABS: 0.5 10*3/uL (ref 0.1–1.0)
Monocytes Relative: 13 %
NEUTROS ABS: 2.3 10*3/uL (ref 1.7–7.7)
Neutrophils Relative %: 55 %
PLATELETS: 188 10*3/uL (ref 150–400)
RBC: 3.75 MIL/uL — AB (ref 3.87–5.11)
RDW: 14.1 % (ref 11.5–15.5)
WBC: 4.1 10*3/uL (ref 4.0–10.5)

## 2016-09-21 LAB — URINE CULTURE

## 2016-09-21 LAB — MAGNESIUM: Magnesium: 2.1 mg/dL (ref 1.7–2.4)

## 2016-09-21 LAB — PROCALCITONIN: PROCALCITONIN: 0.17 ng/mL

## 2016-09-21 MED ORDER — FUROSEMIDE 10 MG/ML IJ SOLN
40.0000 mg | Freq: Two times a day (BID) | INTRAMUSCULAR | Status: DC
  Administered 2016-09-21 – 2016-09-22 (×2): 40 mg via INTRAVENOUS
  Filled 2016-09-21 (×2): qty 4

## 2016-09-21 MED ORDER — AZITHROMYCIN 250 MG PO TABS
500.0000 mg | ORAL_TABLET | Freq: Every day | ORAL | Status: DC
  Administered 2016-09-21: 500 mg via ORAL
  Filled 2016-09-21: qty 2

## 2016-09-21 MED ORDER — PROCHLORPERAZINE EDISYLATE 5 MG/ML IJ SOLN: 10.0000 mg | Freq: Four times a day (QID) | INTRAMUSCULAR | Status: DC | PRN

## 2016-09-21 MED ORDER — POTASSIUM CHLORIDE CRYS ER 20 MEQ PO TBCR
40.0000 meq | EXTENDED_RELEASE_TABLET | Freq: Once | ORAL | Status: AC
  Administered 2016-09-21: 40 meq via ORAL
  Filled 2016-09-21: qty 2

## 2016-09-21 MED ORDER — SUMATRIPTAN SUCCINATE 50 MG PO TABS
100.0000 mg | ORAL_TABLET | ORAL | Status: DC | PRN
  Filled 2016-09-21: qty 2

## 2016-09-21 MED ORDER — SUMATRIPTAN SUCCINATE 50 MG PO TABS
100.0000 mg | ORAL_TABLET | Freq: Once | ORAL | Status: DC
  Filled 2016-09-21: qty 2

## 2016-09-21 MED ORDER — PROCHLORPERAZINE EDISYLATE 5 MG/ML IJ SOLN
10.0000 mg | Freq: Once | INTRAMUSCULAR | Status: AC
  Administered 2016-09-21: 10 mg via INTRAVENOUS
  Filled 2016-09-21: qty 2

## 2016-09-21 NOTE — Progress Notes (Signed)
PHARMACIST - PHYSICIAN COMMUNICATION DR:   [place] Silos CONCERNING: Antibiotic IV to Oral Route Change Policy  RECOMMENDATION: This patient is receiving azithromycin by the intravenous route.  Based on criteria approved by the Pharmacy and Therapeutics Committee, the antibiotic(s) is/are being converted to the equivalent oral dose form(s).   DESCRIPTION: These criteria include:  Patient being treated for a respiratory tract infection, urinary tract infection, cellulitis or clostridium difficile associated diarrhea if on metronidazole  The patient is not neutropenic and does not exhibit a GI malabsorption state  The patient is eating (either orally or via tube) and/or has been taking other orally administered medications for a least 24 hours  The patient is improving clinically and has a Tmax < 100.5  If you have questions about this conversion, please contact the Pharmacy Department  []   [phone] )  [facility] []   [phone] )  [facility] []   [phone] )  [facility] []   [phone] )  [facility] [x]   [phone] )  [place], PharmD, BCPS 09/21/2016 9:42 AM

## 2016-09-21 NOTE — Progress Notes (Signed)
Nutrition Brief Note  Patient identified on the Malnutrition Screening Tool (MST) Report  Wt Readings from Last 15 Encounters:  09/19/16 183 lb (83 kg)  03/31/16 192 lb (87.1 kg)  03/24/16 192 lb (87.1 kg)  03/23/16 192 lb 9.6 oz (87.4 kg)  03/09/16 190 lb (86.2 kg)  02/19/16 190 lb 14.4 oz (86.6 kg)  01/28/16 185 lb (83.9 kg)  12/31/15 185 lb (83.9 kg)  05/01/13 190 lb (86.2 kg)  05/04/11 165 lb (74.8 kg)  03/18/08 183 lb 8 oz (83.2 kg)    Body mass index is 30.45 kg/m. Patient meets criteria for obesity based on current BMI.   Current diet order is regular, patient is consuming approximately 50-100% of meals at this time. Labs and medications reviewed.   No nutrition interventions warranted at this time. If nutrition issues arise, please consult RD.   Clayton Bibles, MS, RD, LDN Pager: [phone] After Hours Pager: [phone]

## 2016-09-21 NOTE — Progress Notes (Signed)
PROGRESS NOTE    Angel Sellers  PRF:[id number] DOB: [birth year] DOA: 09/19/2016 PCP: Lavone Orn, MD    Brief Narrative:  56 year old female history of hypertension, hypothyroidism and migraine headaches presented with fever and noted to have a pneumonia and possible UTI. Patient placed empirically on IV Rocephin and azithromycin. Patient also noted to have a component of volume overload.   Assessment & Plan:   Principal Problem:   CAP (community acquired pneumonia) Active Problems:   Migraine headache   Essential hypertension   Hypothyroidism   Volume overload   Hypokalemia  #1 Community acquired pneumonia Patient had presented with fevers and headaches chest x-ray done was consistent with a left lower lobe pneumonia. Urine strep pneumococcus antigen was negative. Urine Legionella antigen was negative. Fever curve trended down. Continue empiric IV Rocephin and azithromycin. Add Mucinex. Supportive care.  #2 bacteria and a urine Urine cultures with less than 10,000 colonies with insignificant growth.  #3 hypothyroidism Check a TSH. Continue home dose Synthroid.  #4 volume overload Patient with concerns for volume overload as patient was noted to have crackles on physical examination. Patient also with a 10 pound weight gain since admission. Patient seems to be about 5 L positive since admission. Place on Lasix 40 mg IV every 12 hours 3 doses. Follow daily weights and urine output.  #5 headache Compazine 10 mg IV 1. Increase Imitrex 100 mg every 2 hours when necessary.  #6 hypokalemia Replete.  #7 hypertension Well-controlled. Continue Avapro.     DVT prophylaxis: Lovenox Code Status: Full Family Communication: Updated patient. No family at bedside. Disposition Plan: Home when clinically improved, volume overload improved hopefully in the next 24-48 hours.   Consultants:   None  Procedures:   Chest x-ray 09/19/2016  Antimicrobials:   Oral azithromycin  09/21/2016  IV azithromycin 09/20/2016>>>> 09/21/2016  IV Rocephin 09/20/2016   Subjective: Patient states unable to get sputum up. Patient complaining of severe headache. Patient denies chest pain.  Objective: Vitals:   09/21/16 1418 09/21/16 1421 09/21/16 1600 09/21/16 2047  BP:    121/66  Pulse:  86  99  Resp:  16  14  Temp:    99.6 F (37.6 C)  TempSrc: Oral  Oral Oral  SpO2:  96%  97%  Weight:      Height:        Intake/Output Summary (Last 24 hours) at 09/21/16 2049 Last data filed at 09/21/16 1518  Gross per 24 hour  Intake              780 ml  Output                0 ml  Net              780 ml   Filed Weights   09/19/16 1349 09/19/16 2144 09/21/16 1252  Weight: 83 kg (183 lb) 83 kg (183 lb) 87.5 kg (193 lb)    Examination:  General exam: Appears calm and comfortable  Respiratory system: Diffuse scattered crackles. Respiratory effort normal. Cardiovascular system: S1 & S2 heard, RRR. No JVD, murmurs, rubs, gallops or clicks. No pedal edema. Gastrointestinal system: Abdomen is nondistended, soft and nontender. No organomegaly or masses felt. Normal bowel sounds heard. Central nervous system: Alert and oriented. No focal neurological deficits. Extremities: Symmetric 5 x 5 power. Skin: No rashes, lesions or ulcers Psychiatry: Judgement and insight appear normal. Mood & affect appropriate.     Data Reviewed: I have personally reviewed following  labs and imaging studies  CBC:  Recent Labs Lab 09/19/16 1355 09/20/16 0515 09/21/16 0513  WBC 8.7 6.3 4.1  NEUTROABS 6.3 4.2 2.3  HGB 12.7 11.7* 10.8*  HCT 37.8 34.7* 31.7*  MCV 85.5 86.8 84.5  PLT 192 182 381   Basic Metabolic Panel:  Recent Labs Lab 09/19/16 1355 09/20/16 0515 09/21/16 0513  NA 134* 139 140  K 4.1 3.6 3.3*  CL 101 105 106  CO2 23 26 26   GLUCOSE 113* 109* 109*  BUN 9 8 6   CREATININE 0.73 0.76 0.68  CALCIUM 8.7* 8.1* 8.4*  MG  --   --  2.1   GFR: Estimated Creatinine  Clearance: 85.8 mL/min (by C-G formula based on SCr of 0.68 mg/dL). Liver Function Tests:  Recent Labs Lab 09/19/16 1355  AST 27  ALT 27  ALKPHOS 64  BILITOT 1.0  PROT 7.9  ALBUMIN 4.2    Recent Labs Lab 09/19/16 1355  LIPASE 19   No results for input(s): AMMONIA in the last 168 hours. Coagulation Profile: No results for input(s): INR, PROTIME in the last 168 hours. Cardiac Enzymes: No results for input(s): CKTOTAL, CKMB, CKMBINDEX, TROPONINI in the last 168 hours. BNP (last 3 results) No results for input(s): PROBNP in the last 8760 hours. HbA1C: No results for input(s): HGBA1C in the last 72 hours. CBG: No results for input(s): GLUCAP in the last 168 hours. Lipid Profile: No results for input(s): CHOL, HDL, LDLCALC, TRIG, CHOLHDL, LDLDIRECT in the last 72 hours. Thyroid Function Tests: No results for input(s): TSH, T4TOTAL, FREET4, T3FREE, THYROIDAB in the last 72 hours. Anemia Panel: No results for input(s): VITAMINB12, FOLATE, FERRITIN, TIBC, IRON, RETICCTPCT in the last 72 hours. Sepsis Labs:  Recent Labs Lab 09/19/16 1602 09/20/16 0515 09/21/16 0513  PROCALCITON  --  <0.10 0.17  LATICACIDVEN 0.66  --   --     Recent Results (from the past 240 hour(s))  Culture, blood (routine x 2)     Status: None (Preliminary result)   Collection Time: 09/19/16  4:44 PM  Result Value Ref Range Status   Specimen Description BLOOD LEFT HAND  Final   Special Requests IN PEDIATRIC BOTTLE Blood Culture adequate volume  Final   Culture   Final    NO GROWTH 2 DAYS Performed at [facility], [place] [address] [place], [place] [address]    Report Status PENDING  Incomplete  Culture, Urine     Status: Abnormal   Collection Time: 09/19/16  5:14 PM  Result Value Ref Range Status   Specimen Description URINE, RANDOM  Final   Special Requests NONE  Final   Culture (A)  Final    <10,000 COLONIES/mL INSIGNIFICANT GROWTH Performed at [facility], [place] [address],  [place], [place] [address]    Report Status 09/21/2016 FINAL  Final  Culture, blood (routine x 2)     Status: None (Preliminary result)   Collection Time: 09/19/16  7:03 PM  Result Value Ref Range Status   Specimen Description BLOOD RIGHT HAND  Final   Special Requests   Final    BOTTLES DRAWN AEROBIC AND ANAEROBIC Blood Culture adequate volume   Culture   Final    NO GROWTH 2 DAYS Performed at [facility], [place] [address] [place], [place] [address]    Report Status PENDING  Incomplete         Radiology Studies: No results found.      Scheduled Meds: .  albuterol  2.5 mg Nebulization TID  . azithromycin  500 mg Oral q1800  . cholecalciferol  2,000 Units Oral Daily  . DULoxetine  90 mg Oral Daily  . enoxaparin (LOVENOX) injection  40 mg Subcutaneous Q24H  . feeding supplement (ENSURE ENLIVE)  237 mL Oral BID BM  . furosemide  40 mg Intravenous Q12H  . irbesartan  37.5 mg Oral Daily  . levothyroxine  112 mcg Oral QODAY  . levothyroxine  125 mcg Oral QODAY  . pantoprazole  40 mg Oral Daily  . SUMAtriptan  100 mg Oral Once   Continuous Infusions: . cefTRIAXone (ROCEPHIN)  IV Stopped (09/21/16 1905)     LOS: 0 days    Time spent: 93 minutes    Rose Hegner, MD [facility] Pager [phone]  If 7PM-7AM, please contact night-coverage [email or web address] Password [facility] 09/21/2016, 8:49 PM

## 2016-09-22 ENCOUNTER — Inpatient Hospital Stay (HOSPITAL_COMMUNITY): Payer: 59

## 2016-09-22 LAB — CBC WITH DIFFERENTIAL/PLATELET
Basophils Absolute: 0 10*3/uL (ref 0.0–0.1)
Basophils Relative: 0 %
EOS ABS: 0.2 10*3/uL (ref 0.0–0.7)
EOS PCT: 4 %
HCT: 33.8 % — ABNORMAL LOW (ref 36.0–46.0)
Hemoglobin: 11.5 g/dL — ABNORMAL LOW (ref 12.0–15.0)
LYMPHS ABS: 1.7 10*3/uL (ref 0.7–4.0)
LYMPHS PCT: 36 %
MCH: 29.3 pg (ref 26.0–34.0)
MCHC: 34 g/dL (ref 30.0–36.0)
MCV: 86 fL (ref 78.0–100.0)
MONOS PCT: 12 %
Monocytes Absolute: 0.6 10*3/uL (ref 0.1–1.0)
Neutro Abs: 2.3 10*3/uL (ref 1.7–7.7)
Neutrophils Relative %: 48 %
PLATELETS: 227 10*3/uL (ref 150–400)
RBC: 3.93 MIL/uL (ref 3.87–5.11)
RDW: 14.1 % (ref 11.5–15.5)
WBC: 4.9 10*3/uL (ref 4.0–10.5)

## 2016-09-22 LAB — BASIC METABOLIC PANEL
Anion gap: 9 (ref 5–15)
BUN: 9 mg/dL (ref 6–20)
CO2: 25 mmol/L (ref 22–32)
Calcium: 8.5 mg/dL — ABNORMAL LOW (ref 8.9–10.3)
Chloride: 104 mmol/L (ref 101–111)
Creatinine, Ser: 0.71 mg/dL (ref 0.44–1.00)
GFR calc Af Amer: >60 mL/min (ref >60)
GFR calc non Af Amer: >60 mL/min (ref >60)
GLUCOSE: 101 mg/dL — AB (ref 65–99)
Potassium: 3.5 mmol/L (ref 3.5–5.1)
SODIUM: 138 mmol/L (ref 135–145)

## 2016-09-22 LAB — MAGNESIUM: Magnesium: 1.9 mg/dL (ref 1.7–2.4)

## 2016-09-22 LAB — TSH: TSH: 5.659 u[IU]/mL — AB (ref 0.350–4.500)

## 2016-09-22 MED ORDER — POTASSIUM CHLORIDE CRYS ER 20 MEQ PO TBCR
40.0000 meq | EXTENDED_RELEASE_TABLET | Freq: Once | ORAL | Status: AC
  Administered 2016-09-22: 40 meq via ORAL
  Filled 2016-09-22: qty 2

## 2016-09-22 MED ORDER — AMOXICILLIN-POT CLAVULANATE 875-125 MG PO TABS: 1.0000 | ORAL_TABLET | Freq: Two times a day (BID) | ORAL | 0 refills | Status: AC

## 2016-09-22 MED ORDER — PROCHLORPERAZINE EDISYLATE 5 MG/ML IJ SOLN: 10.0000 mg | Freq: Four times a day (QID) | INTRAMUSCULAR | Status: DC | PRN

## 2016-09-22 MED ORDER — ENSURE ENLIVE PO LIQD
237.0000 mL | Freq: Two times a day (BID) | ORAL | 12 refills | Status: DC
Start: 2016-09-22 — End: 2017-04-04

## 2016-09-22 MED ORDER — GUAIFENESIN ER 600 MG PO TB12: 1200.0000 mg | ORAL_TABLET | Freq: Two times a day (BID) | ORAL | 0 refills | Status: AC

## 2016-09-22 MED ORDER — GUAIFENESIN ER 600 MG PO TB12
1200.0000 mg | ORAL_TABLET | Freq: Two times a day (BID) | ORAL | Status: DC
  Administered 2016-09-22: 1200 mg via ORAL
  Filled 2016-09-22: qty 2

## 2016-09-22 MED ORDER — AMOXICILLIN-POT CLAVULANATE 875-125 MG PO TABS
1.0000 | ORAL_TABLET | Freq: Two times a day (BID) | ORAL | Status: DC
  Administered 2016-09-22: 1 via ORAL
  Filled 2016-09-22: qty 1

## 2016-09-22 NOTE — Progress Notes (Signed)
Patient discharged to home with family, discharge instructions reviewed with patient who verbalized understanding. New RX given to patient. 

## 2016-09-22 NOTE — Progress Notes (Signed)
Pt reported that she falls every so often at home, "I'm just clumsy". Bed alarm activated at shift change. Bed alarm just sounded, pt getting up to bathroom. She refuses to have alarm turned back on, stating "I'm not doing this tonight". A&Ox4 and verbalizes understanding of risk for falls and consequences related to that. Continue to monitor. Hortencia Conradi RN

## 2016-09-22 NOTE — Care Management Note (Signed)
Case Management Note  Patient Details  Name: Angel Sellers MRN: [id number] Date of Birth: 1960/10/25  Subjective/Objective:56 y/o f admitted w/PNA. From home. No orders or referrals. No CM needs.                    Action/Plan:d/c home.   Expected Discharge Date:  09/22/16               Expected Discharge Plan:  Home/Self Care  In-House Referral:     Discharge planning Services  CM Consult  Post Acute Care Choice:    Choice offered to:     DME Arranged:    DME Agency:     HH Arranged:    HH Agency:     Status of Service:  Completed, signed off  If discussed at [employer] of Stay Meetings, dates discussed:    Additional Comments:  Dessa Phi, RN 09/22/2016, 12:35 PM

## 2016-09-22 NOTE — Discharge Summary (Signed)
Physician Discharge Summary  Angel Sellers FBP:[id number] DOB: 1960/03/04 DOA: 09/19/2016  PCP: Lavone Orn, MD  Admit date: 09/19/2016 Discharge date: 09/22/2016  Time spent: 65 minutes  Recommendations for Outpatient Follow-up:  1. Follow-up with Lavone Orn, MD in 2 weeks. On follow-up patient will need a basic metabolic profile done to follow-up on electrolytes and renal function. Patient will also need repeat thyroid function studies done in about 4-6 weeks.   Discharge Diagnoses:  Principal Problem:   CAP (community acquired pneumonia) Active Problems:   Migraine headache   Essential hypertension   Hypothyroidism   Volume overload   Hypokalemia   Discharge Condition: Stable and improved  Diet recommendation: Regular  Filed Weights   09/19/16 2144 09/21/16 1252 09/22/16 1000  Weight: 83 kg (183 lb) 87.5 kg (193 lb) 86.1 kg (189 lb 12.8 oz)    History of present illness:  Per Dr.Opyd Avnoor A Angel Sellers is a 56 y.o. female with medical history significant for hypertension, hypothyroidism, and migraines who presented to the ED with 3-4 days of fevers, nausea, and abdominal pain. She reported pain in the mid- to upper-abdomen, described as sharp, constant but waxing and waning, radiating through to her back, and without alleviating or exacerbating factors identified. She reported nausea, but no vomiting. She had two loose stools when symptoms started, but no further diarrhea. No recent antibiotics. No history of MDR organisms.   ED Course: Upon arrival to the ED, patient was found to be febrile, tachycardic in 120's, and with vitals otherwise stable. Chemistry panel and CBC are unremarkable. Lactic acid is reassuringly low. CXR features a LLL opacity concerning for pneumonia. Abdominal US notable only for suspected fatty liver. UA suggests possible UTI. CT abdomen/pelvis with fatty liver infiltration and no other acute abnormalities. Blood cultures were collected, 2 liters of NS  given, and empiric Rocephin and azithromycin started. Tachycardia resolved with the IVF, BP remained stable, there has not been any appreciable respiratory distress, and she will be observed on the medical-surgical unit for ongoing evaluation and management of fevers with abdominal pain and nausea, secondary to CAP and likely UTI.   Hospital Course:  #1 Community acquired pneumonia Patient had presented with fevers and headaches chest x-ray done was consistent with a left lower lobe pneumonia. Patient was admitted placed empirically on IV Rocephin and IV azithromycin. Urine strep pneumococcus antigen was negative. Urine Legionella antigen was negative. Fever curve trended down. Patient improved clinically and subsequently transitioned to oral Augmentin. Patient be discharged home on 5 more days of oral Augmentin to complete a course of antibiotic treatment. Outpatient follow-up.   #2 bacteria and a urine Urine cultures with less than 10,000 colonies with insignificant growth.  #3 hypothyroidism TSH obtained came back at 5.659. Patient was maintained on home regimen of Synthroid. Outpatient follow-up with PCP. Patient will likely need repeat thyroid function studies done in about 4-6 weeks.   #4 volume overload Patient with concerns for volume overload as patient was noted to have crackles on physical examination. Patient also with a 10 pound weight gain since admission. Patient was about 5 L positive since admission. Patient was placed on IV Lasix with good diuresis with a decrease in her weight and significant clinical improvement. Outpatient follow-up.  #5 headache Patient did have some complaints of headache/migraines during the hospitalization. Patient was given a dose of Compazine 10 mg IV 1 and also placed on imitrex as needed. Patient's headache had resolved by day of discharge.   #6 hypokalemia  Repleted.  #7 hypertension Well-controlled. Patient was maintained on home regimen of  Avapro.  Procedures:  Chest x-ray 09/19/2016  Consultations:  None  Discharge Exam: Vitals:   09/22/16 0611 09/22/16 0823  BP: 137/82   Pulse: (!) 101   Resp: 18   Temp: 98.5 F (36.9 C) 98.6 F (37 C)  SpO2: 98%     General: NAD Cardiovascular: RRR Respiratory: Left basilar coarse BS.  Discharge Instructions   Discharge Instructions    Diet general    Complete by:  As directed    Increase activity slowly    Complete by:  As directed      Current Discharge Medication List    START taking these medications   Details  amoxicillin-clavulanate (AUGMENTIN) 875-125 MG tablet Take 1 tablet by mouth every 12 (twelve) hours. Take for 5 days then stop. Qty: 10 tablet, Refills: 0    feeding supplement, ENSURE ENLIVE, (ENSURE ENLIVE) LIQD Take 237 mLs by mouth 2 (two) times daily between meals. Qty: 237 mL, Refills: 12    guaiFENesin (MUCINEX) 600 MG 12 hr tablet Take 2 tablets (1,200 mg total) by mouth 2 (two) times daily. Take for 5 days then stop. Qty: 20 tablet, Refills: 0      CONTINUE these medications which have NOT CHANGED   Details  albuterol (PROVENTIL HFA;VENTOLIN HFA) 108 (90 Base) MCG/ACT inhaler Inhale 2 puffs into the lungs every 6 (six) hours as needed for wheezing or shortness of breath.    amphetamine-dextroamphetamine (ADDERALL XR) 20 MG 24 hr capsule Take 20 mg by mouth daily.    Cholecalciferol (VITAMIN D) 2000 units tablet Take 2,000 Units by mouth daily.    !! DULoxetine (CYMBALTA) 30 MG capsule Take 30 mg by mouth daily.    !! DULoxetine (CYMBALTA) 60 MG capsule Take 60 mg by mouth daily.    !! levothyroxine (SYNTHROID, LEVOTHROID) 112 MCG tablet Take 112 mcg by mouth every other day.     !! levothyroxine (SYNTHROID, LEVOTHROID) 125 MCG tablet Take 125 mcg by mouth every other day.     methocarbamol (ROBAXIN) 500 MG tablet Take 500 mg by mouth 3 (three) times daily as needed (for muscle spasm/pain.).     olmesartan (BENICAR) 5 MG tablet  Take 5 mg by mouth daily.     omeprazole (PRILOSEC) 40 MG capsule Take 40 mg by mouth daily.    promethazine (PHENERGAN) 25 MG tablet Take 25 mg by mouth every 6 (six) hours as needed (for nausea/vomiting with migraines.).     rizatriptan (MAXALT) 10 MG tablet Take 10 mg by mouth daily as needed for migraine. May repeat in 2 hours if needed    tapentadol (NUCYNTA) 50 MG TABS tablet Take 25 mg by mouth daily as needed (for pain due to exertion/falls).      !! - Potential duplicate medications found. Please discuss with provider.     Allergies  Allergen Reactions  . Dilaudid [Hydromorphone Hcl] Other (See Comments)    hypotension  . Ciprofloxacin Itching, Rash and Other (See Comments)    Chest tightness/severe vertigo  . Lactose Intolerance (Gi) Other (See Comments)    GI upset  . Meperidine Nausea And Vomiting  . Morphine Hives and Rash  . Tape Rash   Follow-up Information    Lavone Orn, MD. Schedule an appointment as soon as possible for a visit in 2 week(s).   Specialty:  Internal Medicine Contact information: [address] [place] [address] [place] [phone]  The results of significant diagnostics from this hospitalization (including imaging, microbiology, ancillary and laboratory) are listed below for reference.    Significant Diagnostic Studies: Dg Chest 2 View  Result Date: 09/22/2016 CLINICAL DATA:  56 year old female history of pneumonia. No current chest pain. Some shortness of breath. EXAM: CHEST  2 VIEW COMPARISON:  Chest x-ray 09/19/2016. FINDINGS: Previously noted ill-defined airspace consolidation in the left lower lobe persists, compatible with persistent bronchopneumonia. Cyst predominantly in the posterior aspect of the left lower lobe. Mild diffuse peribronchial cuffing. Trace left pleural effusion. No right pleural effusion. No pneumothorax. No suspicious appearing pulmonary nodules or masses. No evidence of pulmonary edema.  Heart size is normal. Upper mediastinal contours are within normal limits. IMPRESSION: 1. The appearance of the chest is compatible with a background of bronchitis with persistent bronchopneumonia in the left lower lobe and trace left pleural effusion. Electronically Signed   By: Vinnie Langton M.D.   On: 09/22/2016 09:16   Dg Chest 2 View  Result Date: 09/19/2016 CLINICAL DATA:  Concern for pneumonia. EXAM: CHEST  2 VIEW COMPARISON:  Chest x-ray dated February 14, 2015. FINDINGS: The cardiomediastinal silhouette is normal in size. Normal pulmonary vascularity. There is silhouetting of the left hemidiaphragm with subtle increased since in the left lower lobe on the lateral view. No pleural effusion or pneumothorax. No acute osseous abnormality. IMPRESSION: Increased left lower lobe density, concerning for pneumonia. Electronically Signed   By: Titus Dubin M.D.   On: 09/19/2016 14:37   Ct Abdomen Pelvis W Contrast  Result Date: 09/19/2016 CLINICAL DATA:  Abdominal pain, fever and weakness. EXAM: CT ABDOMEN AND PELVIS WITH CONTRAST TECHNIQUE: Multidetector CT imaging of the abdomen and pelvis was performed using the standard protocol following bolus administration of intravenous contrast. CONTRAST:  16mL ISOVUE-300 IOPAMIDOL (ISOVUE-300) INJECTION 61% COMPARISON:  Abdominal ultrasound 09/19/2016. FINDINGS: Lower chest: Left lower lobe infiltrate. No definite pleural effusion. There is a 5.5 mm left lower lobe pulmonary nodule, unchanged since prior CT scan 2016. The right lung base is clear. The heart is normal in size. No pericardial effusion. Hepatobiliary: Diffuse fatty infiltration of the liver. Small low-attenuation lesion in the gallbladder stable and consistent with benign cysts. No worrisome hepatic lesions or intrahepatic biliary dilatation. The portal and hepatic veins are patent. The gallbladder is normal. No common bile duct dilatation. Pancreas: No mass, inflammation or ductal dilatation.  Spleen: Normal size. Stable low-attenuation lesion, likely benign cyst or hemangioma. Adrenals/Urinary Tract: The adrenal glands and kidneys are unremarkable. There are remote surgical changes involving the right kidney. No renal lesions or hydronephrosis. No ureteral or bladder calculi. Stomach/Bowel: The stomach, duodenum, small bowel and colon are unremarkable. The terminal ileum and appendix are normal. No acute inflammatory process, mass lesions or obstructive findings. Vascular/Lymphatic: The aorta is normal in caliber. No dissection. The branch vessels are patent. The major venous structures are patent. No mesenteric or retroperitoneal mass or adenopathy. Small scattered lymph nodes are noted. Reproductive: Surgically absent. Other: No pelvic mass or adenopathy. No free pelvic fluid collections. No inguinal mass or adenopathy. No abdominal wall hernia or subcutaneous lesions. Musculoskeletal: No significant bony findings. IMPRESSION: 1. Left lower lobe infiltrate. 2. Diffuse fatty infiltration of the liver. 3. No acute abdominal/pelvic findings, mass lesions or lymphadenopathy. Electronically Signed   By: Marijo Sanes M.D.   On: 09/19/2016 19:44   US Abdomen Limited  Result Date: 09/19/2016 CLINICAL DATA:  Right upper quadrant pain. EXAM: ULTRASOUND ABDOMEN LIMITED RIGHT UPPER QUADRANT COMPARISON:  CT scan abdomen and pelvis dated 09/29/2014 FINDINGS: Gallbladder: No gallstones or wall thickening visualized. No sonographic Murphy sign noted by sonographer. Common bile duct: Diameter: 3.1 mm, normal. Liver: 1.3 cm cyst in right lobe of the liver adjacent to the gallbladder. Diffuse increased echogenicity consistent with hepatic steatosis as demonstrated on the prior CT scan. Focal fatty sparing of the liver in the posterior aspect of the left lobe. IMPRESSION: 1. Hepatic steatosis. 2. No acute abnormalities. Electronically Signed   By: Lorriane Shire M.D.   On: 09/19/2016 17:22    Microbiology: Recent  Results (from the past 240 hour(s))  Culture, blood (routine x 2)     Status: None (Preliminary result)   Collection Time: 09/19/16  4:44 PM  Result Value Ref Range Status   Specimen Description BLOOD LEFT HAND  Final   Special Requests IN PEDIATRIC BOTTLE Blood Culture adequate volume  Final   Culture   Final    NO GROWTH 2 DAYS Performed at [facility], [address], [place], [place] [address]    Report Status PENDING  Incomplete  Culture, Urine     Status: Abnormal   Collection Time: 09/19/16  5:14 PM  Result Value Ref Range Status   Specimen Description URINE, RANDOM  Final   Special Requests NONE  Final   Culture (A)  Final    <10,000 COLONIES/mL INSIGNIFICANT GROWTH Performed at [facility], [place] [address], [place], [place] [address]    Report Status 09/21/2016 FINAL  Final  Culture, blood (routine x 2)     Status: None (Preliminary result)   Collection Time: 09/19/16  7:03 PM  Result Value Ref Range Status   Specimen Description BLOOD RIGHT HAND  Final   Special Requests   Final    BOTTLES DRAWN AEROBIC AND ANAEROBIC Blood Culture adequate volume   Culture   Final    NO GROWTH 2 DAYS Performed at [facility], [place] [address], [place], [place] [address]    Report Status PENDING  Incomplete     Labs: Basic Metabolic Panel:  Recent Labs Lab 09/19/16 1355 09/20/16 0515 09/21/16 0513 09/22/16 0339  NA 134* 139 140 138  K 4.1 3.6 3.3* 3.5  CL 101 105 106 104  CO2 23 26 26 25   GLUCOSE 113* 109* 109* 101*  BUN 9 8 6 9   CREATININE 0.73 0.76 0.68 0.71  CALCIUM 8.7* 8.1* 8.4* 8.5*  MG  --   --  2.1 1.9   Liver Function Tests:  Recent Labs Lab 09/19/16 1355  AST 27  ALT 27  ALKPHOS 64  BILITOT 1.0  PROT 7.9  ALBUMIN 4.2    Recent Labs Lab 09/19/16 1355  LIPASE 19   No results for input(s): AMMONIA in the last 168 hours. CBC:  Recent Labs Lab 09/19/16 1355 09/20/16 0515 09/21/16 0513 09/22/16 0339  WBC 8.7 6.3 4.1 4.9   NEUTROABS 6.3 4.2 2.3 2.3  HGB 12.7 11.7* 10.8* 11.5*  HCT 37.8 34.7* 31.7* 33.8*  MCV 85.5 86.8 84.5 86.0  PLT 192 182 188 227   Cardiac Enzymes: No results for input(s): CKTOTAL, CKMB, CKMBINDEX, TROPONINI in the last 168 hours. BNP: BNP (last 3 results) No results for input(s): BNP in the last 8760 hours.  ProBNP (last 3 results) No results for input(s): PROBNP in the last 8760 hours.  CBG: No results for input(s): GLUCAP in the last 168 hours.     SignedIrine Seal MD.  [facility] 09/22/2016, 12:48 PM

## 2016-09-22 NOTE — ED Provider Notes (Signed)
[place] DEPT Provider Note   CSN: [id number] Arrival date & time: 09/19/16  1313     History   Chief Complaint Chief Complaint  Patient presents with  . Abdominal Pain  . Fever    HPI Angel Sellers is a 56 y.o. female.  HPI Patient presents to the emergency department with abdominal discomfort, general weakness along with some coughing.  The patient states that she also had fever over the last 3 days.  Patient states she took Tylenol and Motrin for fever.  Patient also states that she has had headache as well  She states that nothing seems make her condition better or worseThe patient denies chest pain, shortness of breath,blurred vision, neck pain, numbness, dizziness, anorexia, edema, abdominal pain, nausea, vomiting, diarrhea, rash, back pain, dysuria, hematemesis, bloody stool, near syncope, or syncope.  He says his abdominal pain seems to be upper abdominal discomfort, especially in the right upper epigastric region.  She states that she had had previous episodes with this type of pain and Dr. Redmond Pulling had mentioned possibly looking at her gallbladder as a cause Past Medical History:  Diagnosis Date  . Arthritis   . Cancer (HCC)    renal  . Chronic fatigue   . Fibromyalgia   . GERD (gastroesophageal reflux disease)   . Headache   . History of kidney cancer   . Hypertension   . Hypothyroidism   . Interstitial cystitis   . Neuromuscular disorder ([place])    nerve  . PONV (postoperative nausea and vomiting)    need scope patch  . POTS (postural orthostatic tachycardia syndrome)   . Thyroid disease     Patient Active Problem List   Diagnosis Date Noted  . Volume overload 09/21/2016  . Hypokalemia 09/21/2016  . Hypothyroidism 09/19/2016  . CAP (community acquired pneumonia) 09/19/2016  . Incisional hernia 03/31/2016  . Fusion of toes 02/26/2016  . Closed displaced fracture of middle phalanx of lesser toe of left foot   . Thumbnail injury 04/10/2013  . Migraine  headache 03/18/2008  . Essential hypertension 03/18/2008  . COUGH 03/18/2008    Past Surgical History:  Procedure Laterality Date  . ABDOMINAL HYSTERECTOMY  1999  . BONE BIOPSY Right 05/01/2013   Procedure: BIOPSY NAIL BED RIGHT THUMB, BIOPSY RIGHT GREAT TOE ;  Surgeon: Wynonia Sours, MD;  Location: [facility];  Service: Orthopedics;  Laterality: Right;  . CESAREAN SECTION     2 c-cestions  . COLONOSCOPY    . CYSTOSCOPY    . DISTAL INTERPHALANGEAL JOINT FUSION Left 02/26/2016   Procedure: LEFT 5TH TOE DISTAL INTERPHALANGEAL JOINT FUSION;  Surgeon: Newt Minion, MD;  Location: [facility];  Service: Orthopedics;  Laterality: Left;  . INCISIONAL HERNIA REPAIR N/A 03/31/2016   Procedure: LAPAROSCOPIC ASSISTED REPAIR OF INCISIONAL HERNIA;  Surgeon: Greer Pickerel, MD;  Location: [facility];  Service: General;  Laterality: N/A;  . INSERTION OF MESH N/A 03/31/2016   Procedure: INSERTION OF MESH;  Surgeon: Greer Pickerel, MD;  Location: [facility];  Service: General;  Laterality: N/A;  . KIDNEY SURGERY  2011   rt partial nephrectomy-[facility]  . TONSILLECTOMY      OB History    No data available       Home Medications    Prior to Admission medications   Medication Sig Start Date End Date Taking? Authorizing Provider  albuterol (PROVENTIL HFA;VENTOLIN HFA) 108 (90 Base) MCG/ACT inhaler Inhale 2 puffs into the lungs every 6 (six) hours as  needed for wheezing or shortness of breath.   Yes [provider]  amphetamine-dextroamphetamine (ADDERALL XR) 20 MG 24 hr capsule Take 20 mg by mouth daily.   Yes [provider]  Cholecalciferol (VITAMIN D) 2000 units tablet Take 2,000 Units by mouth daily.   Yes [provider]  DULoxetine (CYMBALTA) 30 MG capsule Take 30 mg by mouth daily.   Yes [provider]  DULoxetine (CYMBALTA) 60 MG capsule Take 60 mg by mouth daily.   Yes [provider]  levothyroxine (SYNTHROID, LEVOTHROID) 112 MCG tablet Take 112 mcg by  mouth every other day.  11/22/15  Yes [provider]  levothyroxine (SYNTHROID, LEVOTHROID) 125 MCG tablet Take 125 mcg by mouth every other day.  10/10/15  Yes [provider]  methocarbamol (ROBAXIN) 500 MG tablet Take 500 mg by mouth 3 (three) times daily as needed (for muscle spasm/pain.).    Yes [provider]  olmesartan (BENICAR) 5 MG tablet Take 5 mg by mouth daily.  11/22/15  Yes [provider]  omeprazole (PRILOSEC) 40 MG capsule Take 40 mg by mouth daily.   Yes [provider]  promethazine (PHENERGAN) 25 MG tablet Take 25 mg by mouth every 6 (six) hours as needed (for nausea/vomiting with migraines.).  02/06/12  Yes [provider]  rizatriptan (MAXALT) 10 MG tablet Take 10 mg by mouth daily as needed for migraine. May repeat in 2 hours if needed   Yes [provider]  tapentadol (NUCYNTA) 50 MG TABS tablet Take 25 mg by mouth daily as needed (for pain due to exertion/falls).    Yes [provider]    Family History Family History  Problem Relation Age of Onset  . Melanoma Mother     Social History Social History  Substance Use Topics  . Smoking status: Never Smoker  . Smokeless tobacco: Never Used  . Alcohol use No     Allergies   Dilaudid [hydromorphone hcl]; Ciprofloxacin; Lactose intolerance (gi); Meperidine; Morphine; and Tape   Review of Systems Review of Systems All other systems negative except as documented in the HPI. All pertinent positives and negatives as reviewed in the HPI.  Physical Exam Updated Vital Signs BP 121/66 (BP Location: Right Arm)   Pulse 99   Temp 99.6 F (37.6 C) (Oral)   Resp 14   Ht 5\' 5"  (1.651 m)   Wt 87.5 kg (193 lb)   SpO2 97%   BMI 32.12 kg/m   Physical Exam  Constitutional: She is oriented to person, place, and time. She appears well-developed and well-nourished. No distress.  HENT:  Head: Normocephalic and atraumatic.  Mouth/Throat: Oropharynx is  clear and moist.  Eyes: Pupils are equal, round, and reactive to light.  Neck: Normal range of motion. Neck supple.  Cardiovascular: Normal rate, regular rhythm and normal heart sounds.  Exam reveals no gallop and no friction rub.   No murmur heard. Pulmonary/Chest: Effort normal and breath sounds normal. No respiratory distress. She has no wheezes.  Abdominal: Soft. Bowel sounds are normal. She exhibits no distension and no mass. There is tenderness. There is no guarding.    Neurological: She is alert and oriented to person, place, and time. She exhibits normal muscle tone. Coordination normal.  Skin: Skin is warm and dry. Capillary refill takes less than 2 seconds. No rash noted. No erythema.  Psychiatric: She has a normal mood and affect. Her behavior is normal.  Nursing note and vitals reviewed.  ED Treatments / Results  Labs (all labs ordered are listed, but only abnormal results are displayed) Labs Reviewed  URINE CULTURE - Abnormal; Notable for the following:       Result Value   Culture   (*)    Value: <10,000 COLONIES/mL INSIGNIFICANT GROWTH Performed at [facility], [address], [place], [place] [address]    All other components within normal limits  COMPREHENSIVE METABOLIC PANEL - Abnormal; Notable for the following:    Sodium 134 (*)    Glucose, Bld 113 (*)    Calcium 8.7 (*)    All other components within normal limits  URINALYSIS, ROUTINE W REFLEX MICROSCOPIC - Abnormal; Notable for the following:    APPearance HAZY (*)    Hgb urine dipstick SMALL (*)    Ketones, ur 20 (*)    Protein, ur 30 (*)    Leukocytes, UA LARGE (*)    Bacteria, UA MANY (*)    Squamous Epithelial / LPF 0-5 (*)    Non Squamous Epithelial 0-5 (*)    All other components within normal limits  BASIC METABOLIC PANEL - Abnormal; Notable for the following:    Glucose, Bld 109 (*)    Calcium 8.1 (*)    All other components within normal limits  CBC WITH DIFFERENTIAL/PLATELET -  Abnormal; Notable for the following:    Hemoglobin 11.7 (*)    HCT 34.7 (*)    All other components within normal limits  CBC WITH DIFFERENTIAL/PLATELET - Abnormal; Notable for the following:    RBC 3.75 (*)    Hemoglobin 10.8 (*)    HCT 31.7 (*)    All other components within normal limits  BASIC METABOLIC PANEL - Abnormal; Notable for the following:    Potassium 3.3 (*)    Glucose, Bld 109 (*)    Calcium 8.4 (*)    All other components within normal limits  CULTURE, BLOOD (ROUTINE X 2)  CULTURE, BLOOD (ROUTINE X 2)  CULTURE, EXPECTORATED SPUTUM-ASSESSMENT  GRAM STAIN  CBC WITH DIFFERENTIAL/PLATELET  LIPASE, BLOOD  HIV ANTIBODY (ROUTINE TESTING)  STREP PNEUMONIAE URINARY ANTIGEN  LEGIONELLA PNEUMOPHILA SEROGP 1 UR AG  PROCALCITONIN  PROCALCITONIN  MAGNESIUM  CBC  CBC WITH DIFFERENTIAL/PLATELET  BASIC METABOLIC PANEL  MAGNESIUM  TSH  I-STAT CG4 LACTIC ACID, ED  CG4 I-STAT (LACTIC ACID)    EKG  EKG Interpretation None       Radiology No results found.  Procedures Procedures (including critical care time)  Medications Ordered in ED Medications  iopamidol (ISOVUE-300) 61 % injection (not administered)  cholecalciferol (VITAMIN D) tablet 2,000 Units (2,000 Units Oral Given by Other 09/21/16 0951)  levothyroxine (SYNTHROID, LEVOTHROID) tablet 112 mcg (112 mcg Oral Given 09/21/16 0807)  levothyroxine (SYNTHROID, LEVOTHROID) tablet 125 mcg (125 mcg Oral Not Given 09/20/16 0802)  irbesartan (AVAPRO) tablet 37.5 mg (37.5 mg Oral Given 09/21/16 1141)  DULoxetine (CYMBALTA) DR capsule 90 mg (90 mg Oral Given by Other 09/21/16 0952)  methocarbamol (ROBAXIN) tablet 500 mg (500 mg Oral Given 09/20/16 0929)  pantoprazole (PROTONIX) EC tablet 40 mg (40 mg Oral Given by Other 09/21/16 0952)  enoxaparin (LOVENOX) injection 40 mg (40 mg Subcutaneous Given 09/21/16 2200)  0.9 %  sodium chloride infusion ( Intravenous New Bag/Given 09/20/16 0451)  cefTRIAXone (ROCEPHIN) 1 g in dextrose 5 % 50 mL  IVPB (0 g Intravenous Stopped 09/21/16 1905)  acetaminophen (TYLENOL) tablet 650 mg (650 mg Oral Given 09/21/16 0807)  ondansetron (ZOFRAN) injection 4 mg (  not administered)  albuterol (PROVENTIL) (2.5 MG/3ML) 0.083% nebulizer solution 2.5 mg (2.5 mg Nebulization Given 09/20/16 1302)  feeding supplement (ENSURE ENLIVE) (ENSURE ENLIVE) liquid 237 mL (237 mLs Oral Not Given 09/21/16 1703)  ketorolac (TORADOL) 30 MG/ML injection 30 mg (30 mg Intravenous Given 09/21/16 0951)  tapentadol (NUCYNTA) tablet 25 mg (not administered)  albuterol (PROVENTIL) (2.5 MG/3ML) 0.083% nebulizer solution 2.5 mg (2.5 mg Nebulization Not Given 09/21/16 2121)  azithromycin (ZITHROMAX) tablet 500 mg (500 mg Oral Given 09/21/16 1826)  furosemide (LASIX) injection 40 mg (40 mg Intravenous Given 09/21/16 1305)  SUMAtriptan (IMITREX) tablet 100 mg (not administered)  prochlorperazine (COMPAZINE) injection 10 mg (not administered)  sodium chloride 0.9 % bolus 1,000 mL (0 mLs Intravenous Stopped 09/19/16 1712)  ondansetron (ZOFRAN) injection 4 mg (4 mg Intravenous Given 09/19/16 1604)  ketorolac (TORADOL) 30 MG/ML injection 30 mg (30 mg Intravenous Given 09/19/16 1817)  iopamidol (ISOVUE-300) 61 % injection 100 mL (80 mLs Intravenous Contrast Given 09/19/16 1828)  cefTRIAXone (ROCEPHIN) 1 g in dextrose 5 % 50 mL IVPB (0 g Intravenous Stopped 09/19/16 1937)  azithromycin (ZITHROMAX) 500 mg in dextrose 5 % 250 mL IVPB (0 mg Intravenous Stopped 09/19/16 2048)  sodium chloride 0.9 % bolus 1,000 mL (0 mLs Intravenous Stopped 09/19/16 2059)  ondansetron (ZOFRAN) injection 4 mg (4 mg Intravenous Given 09/19/16 1908)  potassium chloride SA (K-DUR,KLOR-CON) CR tablet 40 mEq (40 mEq Oral Given by Other 09/21/16 0388)  prochlorperazine (COMPAZINE) injection 10 mg (10 mg Intravenous Given 09/21/16 1305)  potassium chloride SA (K-DUR,KLOR-CON) CR tablet 40 mEq (40 mEq Oral Given 09/21/16 1305)     Initial Impression / Assessment and Plan / ED Course  I have reviewed  the triage vital signs and the nursing notes.  Pertinent labs & imaging results that were available during my care of the patient were reviewed by me and considered in my medical decision making (see chart for details).     Patient will need admission to the hospital for UTI along with pneumonia.  Patient be given IV antibiotics.  She is advised the plan and all questions were answered.  She is feeling better following fluids.  I spoke with the [facility] hospital to admit the patient Final Clinical Impressions(s) / ED Diagnoses   Final diagnoses:  Community acquired pneumonia of left lower lobe of lung ([place])  Lower urinary tract infectious disease    New Prescriptions Current Discharge Medication List       Dalia Heading, Hershal Coria 09/22/16 Madaline Guthrie, MD 09/23/16 [phone]

## 2016-09-24 LAB — CULTURE, BLOOD (ROUTINE X 2)
Culture: NO GROWTH
Culture: NO GROWTH
Special Requests: ADEQUATE
Special Requests: ADEQUATE

## 2016-09-29 DIAGNOSIS — J181 Lobar pneumonia, unspecified organism: Secondary | ICD-10-CM | POA: Diagnosis not present

## 2016-09-29 DIAGNOSIS — J4520 Mild intermittent asthma, uncomplicated: Secondary | ICD-10-CM | POA: Diagnosis not present

## 2016-10-21 ENCOUNTER — Other Ambulatory Visit: Payer: Self-pay | Admitting: Internal Medicine

## 2016-10-21 ENCOUNTER — Ambulatory Visit
Admission: RE | Admit: 2016-10-21 | Discharge: 2016-10-21 | Disposition: A | Discharge status: Home / Self Care | Payer: 59 | Source: Ambulatory Visit | Attending: Internal Medicine | Admitting: Internal Medicine

## 2016-10-21 DIAGNOSIS — J189 Pneumonia, unspecified organism: Secondary | ICD-10-CM

## 2016-11-25 DIAGNOSIS — Z1231 Encounter for screening mammogram for malignant neoplasm of breast: Secondary | ICD-10-CM | POA: Diagnosis not present

## 2016-12-23 ENCOUNTER — Other Ambulatory Visit: Payer: Self-pay | Admitting: Physician Assistant

## 2017-01-11 ENCOUNTER — Ambulatory Visit: Payer: 59 | Attending: Gynecologic Oncology | Admitting: Gynecologic Oncology

## 2017-01-11 ENCOUNTER — Encounter: Payer: Self-pay | Admitting: Gynecologic Oncology

## 2017-01-11 VITALS — BP 131/92 | HR 113 | Temp 98.9°F | Resp 20 | Ht 66.0 in | Wt 188.2 lb

## 2017-01-11 DIAGNOSIS — D072 Carcinoma in situ of vagina: Secondary | ICD-10-CM

## 2017-01-11 DIAGNOSIS — E039 Hypothyroidism, unspecified: Secondary | ICD-10-CM | POA: Diagnosis not present

## 2017-01-11 DIAGNOSIS — Z79899 Other long term (current) drug therapy: Secondary | ICD-10-CM | POA: Diagnosis not present

## 2017-01-11 DIAGNOSIS — Z90710 Acquired absence of both cervix and uterus: Secondary | ICD-10-CM | POA: Insufficient documentation

## 2017-01-11 DIAGNOSIS — Z85528 Personal history of other malignant neoplasm of kidney: Secondary | ICD-10-CM | POA: Diagnosis not present

## 2017-01-11 DIAGNOSIS — I10 Essential (primary) hypertension: Secondary | ICD-10-CM | POA: Insufficient documentation

## 2017-01-11 DIAGNOSIS — M797 Fibromyalgia: Secondary | ICD-10-CM | POA: Diagnosis not present

## 2017-01-11 DIAGNOSIS — R5382 Chronic fatigue, unspecified: Secondary | ICD-10-CM | POA: Insufficient documentation

## 2017-01-11 DIAGNOSIS — D071 Carcinoma in situ of vulva: Secondary | ICD-10-CM

## 2017-01-11 DIAGNOSIS — K219 Gastro-esophageal reflux disease without esophagitis: Secondary | ICD-10-CM | POA: Diagnosis not present

## 2017-01-11 NOTE — Patient Instructions (Signed)
Plan on having your procedure the morning of December 12.  You will be having a wide local excision in the office.  Please call for any questions or concerns.

## 2017-01-11 NOTE — Progress Notes (Signed)
Consult Note: Gyn-Onc  Angel Sellers 56 y.o. female  CC:  Chief Complaint  Patient presents with  . VAIN III (vaginal intraepithelial neoplasia grade III)    HPI: Patient is seen today in consultation at the request of [facility] [employer].  Patient is a very pleasant 6 cystoscopy gravida 2 para 2 who underwent hysterectomy 1999 secondary to uterine fibroids. She had an outbreak of interstitial cystitis and for some reason secondary to those symptoms she looked at her perineum with the mirror and saw dark flat freckle. She has never noticed this before and it was not associated with any pruritus. She subsequently underwent a biopsy of the lesion on 12/23/2016. 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 The keratinocytes are basaloid in appearance. Invasive disease is not seen. The lesion stains positive for P16. Sox-10 and MITF highlight melanocytic hyperplasia. In these sections the margins are involved.  She's overall doing fairly well. She does have fibromyalgia as well as other autoimmune things. She has not been sexually active in the last year and a half or 2 years but would like to be. When she saw Dr. [name] and clinically did talk about physical therapy and she is interested in doing this. She does have a significant family history of renal cell carcinoma including herself. Both her brother and sister died of renal cell cancer at the age of 38. They're both diagnosed at the age of 34. She has had testing for Donnal Debar is negative. She and her children are in an NIH study undergoing screening. The patient had 2 cesarean sections in 1987 in 1991. She's had bilateral breast reduction surgery, and a hernia repair in 2018.  Her  mammograms are up-to-date. Her last colonoscopy was 6 years ago. She does not require one for another 4 years. There are no polyps at the time of her last colonoscopy.  Current Meds:  Outpatient Encounter Medications as of 01/11/2017  Medication Sig  . albuterol (PROVENTIL HFA;VENTOLIN HFA) 108 (90 Base) MCG/ACT inhaler Inhale 2 puffs into the lungs every 6 (six) hours as needed for wheezing or shortness of breath.  . amphetamine-dextroamphetamine (ADDERALL XR) 20 MG 24 hr capsule Take 20 mg by mouth daily.  . Cholecalciferol (VITAMIN D) 2000 units tablet Take 2,000 Units by mouth daily.  . DULoxetine (CYMBALTA) 30 MG capsule Take 30 mg by mouth daily.  . DULoxetine (CYMBALTA) 60 MG capsule Take 60 mg by mouth daily.  . feeding supplement, ENSURE ENLIVE, (ENSURE ENLIVE) LIQD Take 237 mLs by mouth 2 (two) times daily between meals.  Marland Kitchen levothyroxine (SYNTHROID, LEVOTHROID) 112 MCG tablet Take 112 mcg by mouth every other day.   . levothyroxine (SYNTHROID, LEVOTHROID) 125 MCG tablet Take 125 mcg by mouth every other day.   . methocarbamol (ROBAXIN) 500 MG tablet Take 500 mg by mouth 3 (three) times daily as needed (for muscle spasm/pain.).   Marland Kitchen olmesartan (BENICAR) 5 MG tablet Take 5 mg by mouth daily.   Marland Kitchen omeprazole (PRILOSEC) 40 MG capsule Take 40 mg by mouth daily.  . promethazine (PHENERGAN) 25 MG tablet Take 25 mg by mouth every 6 (six) hours as needed (for nausea/vomiting with migraines.).   Marland Kitchen rizatriptan (MAXALT) 10 MG tablet Take 10 mg by mouth daily as needed for migraine. May repeat in 2 hours if  needed  . tapentadol (NUCYNTA) 50 MG TABS tablet Take 25 mg by mouth daily as needed (for pain due to exertion/falls).    No facility-administered encounter medications on file as of 01/11/2017.     Allergy:  Allergies  Allergen Reactions  . Other Rash    Derma bond   . Dilaudid [Hydromorphone Hcl] Other (See Comments)    hypotension  . Ciprofloxacin Itching, Rash and Other (See  Comments)    Chest tightness/severe vertigo  . Lactose Intolerance (Gi) Other (See Comments)    GI upset  . Meperidine Nausea And Vomiting  . Morphine Hives and Rash    Social Hx:   Social History   Socioeconomic History  . Marital status: Married    Spouse name: Not on file  . Number of children: Not on file  . Years of education: Not on file  . Highest education level: Not on file  Social Needs  . Financial resource strain: Not on file  . Food insecurity - worry: Not on file  . Food insecurity - inability: Not on file  . Transportation needs - medical: Not on file  . Transportation needs - non-medical: Not on file  Occupational History  . Not on file  Tobacco Use  . Smoking status: Never Smoker  . Smokeless tobacco: Never Used  Substance and Sexual Activity  . Alcohol use: No  . Drug use: No  . Sexual activity: Not on file  Other Topics Concern  . Not on file  Social History Narrative  . Not on file    Past Surgical Hx:  Past Surgical History:  Procedure Laterality Date  . ABDOMINAL HYSTERECTOMY  1999  . BONE BIOPSY Right 05/01/2013   Procedure: BIOPSY NAIL BED RIGHT THUMB, BIOPSY RIGHT GREAT TOE ;  Surgeon: Wynonia Sours, MD;  Location: [facility];  Service: Orthopedics;  Laterality: Right;  . CESAREAN SECTION     2 c-cestions  . COLONOSCOPY    . CYSTOSCOPY    . DISTAL INTERPHALANGEAL JOINT FUSION Left 02/26/2016   Procedure: LEFT 5TH TOE DISTAL INTERPHALANGEAL JOINT FUSION;  Surgeon: Newt Minion, MD;  Location: [facility];  Service: Orthopedics;  Laterality: Left;  . INCISIONAL HERNIA REPAIR N/A 03/31/2016   Procedure: LAPAROSCOPIC ASSISTED REPAIR OF INCISIONAL HERNIA;  Surgeon: Greer Pickerel, MD;  Location[facility];  Service: General;  Laterality: N/A;  . INSERTION OF MESH N/A 03/31/2016   Procedure: INSERTION OF MESH;  Surgeon: Greer Pickerel, MD;  Location[facility];  Service: General;  Laterality: N/A;  . KIDNEY SURGERY  2011   rt partial nephrectomy-[facility]   . TONSILLECTOMY      Past Medical Hx:  Past Medical History:  Diagnosis Date  . Arthritis   . Cancer (HCC)    renal  . Chronic fatigue   . Fibromyalgia   . GERD (gastroesophageal reflux disease)   . Headache   . History of kidney cancer   . Hypertension   . Hypothyroidism   . Interstitial cystitis   . Neuromuscular disorder ([place])    nerve  . PONV (postoperative nausea and vomiting)    need scope patch  . POTS (postural orthostatic tachycardia syndrome)   . Thyroid disease     Oncology Hx:   No history exists.    Family Hx:  Family History  Problem Relation Age of Onset  . Melanoma Mother   . Kidney cancer Sister   . Kidney cancer Brother     Vitals:  Blood pressure (!) 131/92, pulse (!) 113, temperature 98.9 F (37.2 C), temperature source Oral, resp. rate 20, height 5\' 6"  (1.676 m), weight 188 lb 3.2 oz (85.4 kg), SpO2 99 %.  Physical Exam: Well-nourished well-developed female in no acute distress.  Pelvic external genitalia is notable for a well-healing incision from a shave biopsy on the right lower labia majora. The remainder of the labia was inspected and there are no other visible lesions. There was a small cut on the mons from shaving. Speculum examination revealed an atrophic vagina. There are no visible lesions in the vagina. The vaginal cuff is visualized and there are no visible lesions. Bimanual examination revealed no masses or nodularity.  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 I believe that we can perform a wide local excision in the office and she is amenable to doing this and would like to do it as soon as possible. She scheduled for December 12.  I appreciate the opportunity to partner in the care of this very pleasant patient.  Amayia Ciano A., MD 01/11/2017, 4:10 PM

## 2017-01-24 NOTE — Progress Notes (Signed)
Consult Note: Gyn-Onc  Angel Sellers 56 y.o. female  CC:  Chief Complaint  Patient presents with  . Vulvar Dysplasia    Follow up    HPI: Patient was seen in consultation at the request of [facility]  Patient is a very pleasant 55 year old gravida 2 para 2 who underwent hysterectomy 1999 secondary to uterine fibroids. She had an outbreak of interstitial cystitis and for some reason secondary to those symptoms she looked at her perineum with the mirror and saw dark flat freckle. She has never noticed this before and it was not associated with any pruritus. She subsequently underwent a biopsy of the lesion on 12/23/2016.  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 The keratinocytes are basaloid in appearance. Invasive disease is not seen. The lesion stains positive for P16. Sox-10 and MITF highlight melanocytic hyperplasia. In these sections the margins are involved.  She's overall doing fairly well. She does have fibromyalgia as well as other autoimmune things. She has not been sexually active in the last year and a half or 2 years but would like to be. When she saw Dr. [name] and clinically did talk about physical therapy and she is interested in doing this. She does have a significant family history of renal cell carcinoma including herself. Both her brother and sister died of renal cell cancer at the age of 31. They're both diagnosed at the age of 68. She has had testing for Donnal Debar is negative. She and her children are in an NIH study undergoing screening. The patient had 2 cesarean sections in 1987 in 1991. She's had bilateral breast reduction surgery, and a hernia repair in 2018.  Her mammograms are up-to-date. Her  last colonoscopy was 6 years ago. She does not require one for another 4 years. There are no polyps at the time of her last colonoscopy.  I saw her a few weeks ago for an initial consultation and she comes in today for an office WLE. Risks and benefits discussed with the patient and informed consent was signed on the chart.  + HSV incision.  Current Meds:  Outpatient Encounter Medications as of 01/25/2017  Medication Sig  . albuterol (PROVENTIL HFA;VENTOLIN HFA) 108 (90 Base) MCG/ACT inhaler Inhale 2 puffs into the lungs every 6 (six) hours as needed for wheezing or shortness of breath.  . amphetamine-dextroamphetamine (ADDERALL XR) 20 MG 24 hr capsule Take 20 mg by mouth daily.  . Cholecalciferol (VITAMIN D) 2000 units tablet Take 2,000 Units by mouth daily.  . DULoxetine (CYMBALTA) 30 MG capsule Take 30 mg by mouth daily.  . DULoxetine (CYMBALTA) 60 MG capsule Take 60 mg by mouth daily.  . feeding supplement, ENSURE ENLIVE, (ENSURE ENLIVE) LIQD Take 237 mLs by mouth 2 (two) times daily between meals.  Marland Kitchen levothyroxine (SYNTHROID, LEVOTHROID) 112 MCG tablet Take 112 mcg by mouth every other day.   . levothyroxine (SYNTHROID, LEVOTHROID) 125 MCG tablet Take 125 mcg by mouth every other day.   . methocarbamol (ROBAXIN) 500 MG tablet Take 500 mg by mouth 3 (three) times daily as needed (for muscle spasm/pain.).   Marland Kitchen olmesartan (BENICAR) 5 MG tablet Take 5 mg by mouth daily.   Marland Kitchen omeprazole (PRILOSEC) 40 MG capsule Take 40 mg by mouth daily.  . promethazine (PHENERGAN) 25  MG tablet Take 25 mg by mouth every 6 (six) hours as needed (for nausea/vomiting with migraines.).   Marland Kitchen rizatriptan (MAXALT) 10 MG tablet Take 10 mg by mouth daily as needed for migraine. May repeat in 2 hours if needed  . tapentadol (NUCYNTA) 50 MG TABS tablet Take 25 mg by mouth daily as needed (for pain due to exertion/falls).    No facility-administered encounter medications on file as of 01/25/2017.     Allergy:  Allergies   Allergen Reactions  . Other Rash    Derma bond   . Dilaudid [Hydromorphone Hcl] Other (See Comments)    hypotension  . Ciprofloxacin Itching, Rash and Other (See Comments)    Chest tightness/severe vertigo  . Lactose Intolerance (Gi) Other (See Comments)    GI upset  . Meperidine Nausea And Vomiting  . Morphine Hives and Rash    Social Hx:   Social History   Socioeconomic History  . Marital status: Married    Spouse name: Not on file  . Number of children: Not on file  . Years of education: Not on file  . Highest education level: Not on file  Social Needs  . Financial resource strain: Not on file  . Food insecurity - worry: Not on file  . Food insecurity - inability: Not on file  . Transportation needs - medical: Not on file  . Transportation needs - non-medical: Not on file  Occupational History  . Not on file  Tobacco Use  . Smoking status: Never Smoker  . Smokeless tobacco: Never Used  Substance and Sexual Activity  . Alcohol use: No  . Drug use: No  . Sexual activity: Not on file  Other Topics Concern  . Not on file  Social History Narrative  . Not on file    Past Surgical Hx:  Past Surgical History:  Procedure Laterality Date  . ABDOMINAL HYSTERECTOMY  1999  . BONE BIOPSY Right 05/01/2013   Procedure: BIOPSY NAIL BED RIGHT THUMB, BIOPSY RIGHT GREAT TOE ;  Surgeon: Wynonia Sours, MD;  Location: [facility];  Service: Orthopedics;  Laterality: Right;  . CESAREAN SECTION     2 c-cestions  . COLONOSCOPY    . CYSTOSCOPY    . DISTAL INTERPHALANGEAL JOINT FUSION Left 02/26/2016   Procedure: LEFT 5TH TOE DISTAL INTERPHALANGEAL JOINT FUSION;  Surgeon: Newt Minion, MD;  Location: [facility];  Service: Orthopedics;  Laterality: Left;  . INCISIONAL HERNIA REPAIR N/A 03/31/2016   Procedure: LAPAROSCOPIC ASSISTED REPAIR OF INCISIONAL HERNIA;  Surgeon: [facility] Pickerel, MD;  Location: [facility];  Service: General;  Laterality: N/A;  . INSERTION OF MESH N/A 03/31/2016    Procedure: INSERTION OF MESH;  Surgeon: [facility] Pickerel, MD;  Location: [facility];  Service: General;  Laterality: N/A;  . KIDNEY SURGERY  2011   rt partial nephrectomy-[facility]  . TONSILLECTOMY      Past Medical Hx:  Past Medical History:  Diagnosis Date  . Arthritis   . Cancer (HCC)    renal  . Chronic fatigue   . Fibromyalgia   . GERD (gastroesophageal reflux disease)   . Headache   . History of kidney cancer   . Hypertension   . Hypothyroidism   . Interstitial cystitis   . Neuromuscular disorder ([place])    nerve  . PONV (postoperative nausea and vomiting)    need scope patch  . POTS (postural orthostatic tachycardia syndrome)   . Thyroid disease     Oncology Hx:  No history exists.    Family Hx:  Family History  Problem Relation Age of Onset  . Melanoma Mother   . Kidney cancer Sister   . Kidney cancer Brother     Vitals:  Blood pressure (!) 147/99, pulse (!) 106, temperature 99.5 F (37.5 C), temperature source Oral, resp. rate 20, weight 189 lb (85.7 kg), SpO2 98 %.  Physical Exam: Well-nourished well-developed female in no acute distress.  Pelvic external genitalia is notable for a well-healing incision from a shave biopsy on the right lower labia majora. + HSV about 3 cm inferior near the anus. The remainder of the labia was inspected and there are no other visible lesions.   After obtaining her sign consent. There is cleansed with Betadine. 2 miles of 2% lidocaine without epinephrine was injected. The area was well seen. An elliptical incision measuring approximately one and half centimeters in length and 1 cm in width was made with a 15 blade. The skin was shaved off to the appropriate depth. Hemostasis was obtained with silver nitrate. 5 individual interrupted sutures of 4 Vicryl were used to reapproximate the skin and for hemostasis.  She tolerated the procedure well.  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 I will notify her  of the results of her pathology. Post-procedural instructions were provided.  I appreciate the opportunity to partner in the care of this very pleasant patient.  Clemmie Buelna A., MD 01/25/2017, 8:49 AM

## 2017-01-25 ENCOUNTER — Other Ambulatory Visit (HOSPITAL_COMMUNITY)
Admission: RE | Admit: 2017-01-25 | Discharge: 2017-01-25 | Disposition: A | Discharge status: Home / Self Care | Payer: 59 | Source: Ambulatory Visit | Attending: Gynecologic Oncology | Admitting: Gynecologic Oncology

## 2017-01-25 ENCOUNTER — Encounter: Payer: Self-pay | Admitting: Gynecologic Oncology

## 2017-01-25 ENCOUNTER — Ambulatory Visit: Payer: 59 | Attending: Gynecologic Oncology | Admitting: Gynecologic Oncology

## 2017-01-25 ENCOUNTER — Other Ambulatory Visit: Payer: Self-pay | Admitting: Gynecologic Oncology

## 2017-01-25 VITALS — BP 147/99 | HR 106 | Temp 99.5°F | Resp 20 | Wt 189.0 lb

## 2017-01-25 DIAGNOSIS — I10 Essential (primary) hypertension: Secondary | ICD-10-CM | POA: Insufficient documentation

## 2017-01-25 DIAGNOSIS — D071 Carcinoma in situ of vulva: Secondary | ICD-10-CM

## 2017-01-25 DIAGNOSIS — Z79899 Other long term (current) drug therapy: Secondary | ICD-10-CM | POA: Diagnosis not present

## 2017-01-25 DIAGNOSIS — Z888 Allergy status to other drugs, medicaments and biological substances status: Secondary | ICD-10-CM | POA: Diagnosis not present

## 2017-01-25 DIAGNOSIS — Z90710 Acquired absence of both cervix and uterus: Secondary | ICD-10-CM | POA: Diagnosis not present

## 2017-01-25 DIAGNOSIS — Z9889 Other specified postprocedural states: Secondary | ICD-10-CM | POA: Insufficient documentation

## 2017-01-25 NOTE — Patient Instructions (Addendum)
Post-operative Instructions  Activity  During the first few days at home, you will need to listen to your body.  It is important to be up and move around.  It is equally important to rest when you feel tired.  You should not, however, just lie in bed for days.  This will make you stiff and does increase your risk of clot formation right after your surgery.  Try to increase activity level slowly each day.  You may climb stairs. Go slowly at first.  No heavy lifting (>10 pounds) for at least four weeks.  Heavy household chores like vacuuming, mopping, pushing furniture, and activities that require repetitive reaching should be avoided.  You can cook if it doesn't make you uncomfortable.  You can do laundry, just be careful when taking items in and out of the washer/dryer.  Do not lift a heavy laundry basket.  You can go grocery shopping if it doesn't make you too tired.  In general, you can bend and you can lift light objects but you must listen to your body.  If it hurts, don't do it.  Nothing in the vagina for six weeks, including tampons, douching, or engaging in sexual activity.  You can shower the day you get home from the hospital. No tub baths for one week.  Pain Management  You can use over the counter Motrin 200 mg.  Generic Ibuprofen is the same as Motrin.  You can take 800 mg (or 4-200 mg tablets) every 8 hours. Alternating this with your other pain medications will help you use less pain medication.  You should not exceed taking 2000 mg of Tylenol.    Incision Care  Your incision has disolvable sutures.  Do not pick these off.    Use water only to clean the incisions. A  If you have any redness or draining from the incision that makes you think there is infection, please call the office.  Urination and Bowel Movements To help prevent constipation, drink lots of fluids and stay hydrated.  You should use a mild stool softener like over the counter Colace (Docusate Sodium)  twice daily.  Also, drinking warm liquids like coffee, tea, and hot chocolate can help.  Moving around helps too.  Stop the stool softener when you are having regular bowel movements.  Vaginal Care  You do not need to do anything special.  Just shower normally and pat the area dry.  Expect spotting or light bleeding.  Do not use tampons.  You will need to wear a mini pad.  Important Reasons to Call the Office  Fever > 100.5. This is a real post-operative temperature and you will need to be seen.  Heavy vaginal bleeding like a period.  Unusual vaginal discharge or odor.  Redness or drainage from your incisions that look like infection.

## 2017-01-26 ENCOUNTER — Telehealth: Payer: Self-pay | Admitting: Gynecologic Oncology

## 2017-01-26 NOTE — Telephone Encounter (Signed)
Informed patient of biopsy results.  She states she is doing well this am but reported a fever with muscle spasms yesterday evening that has resolved today.  Follow up appt moved to Friday the 21st.  No concerns voiced.  Advised to call for any needs.

## 2017-02-01 ENCOUNTER — Ambulatory Visit: Payer: 59 | Admitting: Gynecologic Oncology

## 2017-02-03 ENCOUNTER — Ambulatory Visit: Payer: 59 | Admitting: Gynecologic Oncology

## 2017-02-15 ENCOUNTER — Telehealth: Payer: Self-pay | Admitting: *Deleted

## 2017-02-15 DIAGNOSIS — Z01818 Encounter for other preprocedural examination: Secondary | ICD-10-CM | POA: Diagnosis not present

## 2017-02-15 DIAGNOSIS — H02423 Myogenic ptosis of bilateral eyelids: Secondary | ICD-10-CM | POA: Diagnosis not present

## 2017-02-15 NOTE — Telephone Encounter (Signed)
Per request from [facility] at [facility], faxed last office note and surgical path report.

## 2017-03-08 DIAGNOSIS — H02423 Myogenic ptosis of bilateral eyelids: Secondary | ICD-10-CM | POA: Diagnosis not present

## 2017-03-08 DIAGNOSIS — H02403 Unspecified ptosis of bilateral eyelids: Secondary | ICD-10-CM | POA: Diagnosis not present

## 2017-03-08 DIAGNOSIS — H02422 Myogenic ptosis of left eyelid: Secondary | ICD-10-CM | POA: Diagnosis not present

## 2017-03-08 DIAGNOSIS — H02421 Myogenic ptosis of right eyelid: Secondary | ICD-10-CM | POA: Diagnosis not present

## 2017-03-16 ENCOUNTER — Ambulatory Visit
Admission: RE | Admit: 2017-03-16 | Discharge: 2017-03-16 | Disposition: A | Discharge status: Home / Self Care | Payer: Medicare Other | Source: Ambulatory Visit | Attending: Internal Medicine | Admitting: Internal Medicine

## 2017-03-16 ENCOUNTER — Other Ambulatory Visit: Payer: Self-pay | Admitting: Internal Medicine

## 2017-03-16 DIAGNOSIS — R05 Cough: Secondary | ICD-10-CM | POA: Diagnosis not present

## 2017-03-16 DIAGNOSIS — R509 Fever, unspecified: Secondary | ICD-10-CM

## 2017-03-17 DIAGNOSIS — I10 Essential (primary) hypertension: Secondary | ICD-10-CM | POA: Diagnosis not present

## 2017-03-17 DIAGNOSIS — R7301 Impaired fasting glucose: Secondary | ICD-10-CM | POA: Diagnosis not present

## 2017-03-17 DIAGNOSIS — K219 Gastro-esophageal reflux disease without esophagitis: Secondary | ICD-10-CM | POA: Diagnosis not present

## 2017-03-17 DIAGNOSIS — R000 Tachycardia, unspecified: Secondary | ICD-10-CM | POA: Diagnosis not present

## 2017-03-17 DIAGNOSIS — A0811 Acute gastroenteropathy due to Norwalk agent: Secondary | ICD-10-CM

## 2017-03-17 DIAGNOSIS — J4520 Mild intermittent asthma, uncomplicated: Secondary | ICD-10-CM | POA: Diagnosis not present

## 2017-03-17 DIAGNOSIS — E039 Hypothyroidism, unspecified: Secondary | ICD-10-CM | POA: Diagnosis not present

## 2017-03-17 HISTORY — DX: Acute gastroenteropathy due to Norwalk agent: A08.11

## 2017-04-03 ENCOUNTER — Encounter (HOSPITAL_COMMUNITY): Payer: Self-pay

## 2017-04-03 ENCOUNTER — Inpatient Hospital Stay (HOSPITAL_COMMUNITY)
Admission: EM | Admit: 2017-04-03 | Discharge: 2017-04-05 | DRG: 392 | Disposition: A | Discharge status: Home / Self Care | Payer: 59 | Attending: Family Medicine | Admitting: Family Medicine

## 2017-04-03 DIAGNOSIS — A09 Infectious gastroenteritis and colitis, unspecified: Secondary | ICD-10-CM | POA: Diagnosis not present

## 2017-04-03 DIAGNOSIS — I10 Essential (primary) hypertension: Secondary | ICD-10-CM | POA: Diagnosis present

## 2017-04-03 DIAGNOSIS — R100 Acute abdomen: Secondary | ICD-10-CM | POA: Diagnosis not present

## 2017-04-03 DIAGNOSIS — K219 Gastro-esophageal reflux disease without esophagitis: Secondary | ICD-10-CM | POA: Diagnosis present

## 2017-04-03 DIAGNOSIS — R509 Fever, unspecified: Secondary | ICD-10-CM

## 2017-04-03 DIAGNOSIS — R000 Tachycardia, unspecified: Secondary | ICD-10-CM | POA: Diagnosis not present

## 2017-04-03 DIAGNOSIS — Z905 Acquired absence of kidney: Secondary | ICD-10-CM

## 2017-04-03 DIAGNOSIS — F32A Depression, unspecified: Secondary | ICD-10-CM | POA: Diagnosis present

## 2017-04-03 DIAGNOSIS — M797 Fibromyalgia: Secondary | ICD-10-CM | POA: Diagnosis present

## 2017-04-03 DIAGNOSIS — M79606 Pain in leg, unspecified: Secondary | ICD-10-CM | POA: Diagnosis not present

## 2017-04-03 DIAGNOSIS — A0811 Acute gastroenteropathy due to Norwalk agent: Principal | ICD-10-CM | POA: Diagnosis present

## 2017-04-03 DIAGNOSIS — R112 Nausea with vomiting, unspecified: Secondary | ICD-10-CM | POA: Diagnosis not present

## 2017-04-03 DIAGNOSIS — F329 Major depressive disorder, single episode, unspecified: Secondary | ICD-10-CM | POA: Diagnosis present

## 2017-04-03 DIAGNOSIS — R42 Dizziness and giddiness: Secondary | ICD-10-CM | POA: Diagnosis not present

## 2017-04-03 DIAGNOSIS — Z8619 Personal history of other infectious and parasitic diseases: Secondary | ICD-10-CM

## 2017-04-03 DIAGNOSIS — Z85528 Personal history of other malignant neoplasm of kidney: Secondary | ICD-10-CM

## 2017-04-03 DIAGNOSIS — A419 Sepsis, unspecified organism: Secondary | ICD-10-CM | POA: Diagnosis present

## 2017-04-03 DIAGNOSIS — G43909 Migraine, unspecified, not intractable, without status migrainosus: Secondary | ICD-10-CM | POA: Diagnosis present

## 2017-04-03 DIAGNOSIS — R109 Unspecified abdominal pain: Secondary | ICD-10-CM | POA: Diagnosis not present

## 2017-04-03 DIAGNOSIS — E039 Hypothyroidism, unspecified: Secondary | ICD-10-CM | POA: Diagnosis not present

## 2017-04-03 DIAGNOSIS — R197 Diarrhea, unspecified: Secondary | ICD-10-CM | POA: Diagnosis present

## 2017-04-03 DIAGNOSIS — Z8051 Family history of malignant neoplasm of kidney: Secondary | ICD-10-CM

## 2017-04-03 HISTORY — DX: Acute gastroenteropathy due to Norwalk agent: A08.11

## 2017-04-03 LAB — CBG MONITORING, ED: Glucose-Capillary: 129 mg/dL — ABNORMAL HIGH (ref 65–99)

## 2017-04-03 LAB — I-STAT CG4 LACTIC ACID, ED: Lactic Acid, Venous: 2.65 mmol/L (ref 0.5–1.9)

## 2017-04-03 LAB — I-STAT CHEM 8, ED
BUN: 10 mg/dL (ref 6–20)
CALCIUM ION: 1.05 mmol/L — AB (ref 1.15–1.40)
CHLORIDE: 105 mmol/L (ref 101–111)
CREATININE: 0.6 mg/dL (ref 0.44–1.00)
Glucose, Bld: 143 mg/dL — ABNORMAL HIGH (ref 65–99)
HCT: 43 % (ref 36.0–46.0)
Hemoglobin: 14.6 g/dL (ref 12.0–15.0)
Potassium: 4 mmol/L (ref 3.5–5.1)
Sodium: 141 mmol/L (ref 135–145)
TCO2: 22 mmol/L (ref 22–32)

## 2017-04-03 LAB — CBC
HEMATOCRIT: 43.1 % (ref 36.0–46.0)
Hemoglobin: 14.3 g/dL (ref 12.0–15.0)
MCH: 28.9 pg (ref 26.0–34.0)
MCHC: 33.2 g/dL (ref 30.0–36.0)
MCV: 87.1 fL (ref 78.0–100.0)
PLATELETS: 224 10*3/uL (ref 150–400)
RBC: 4.95 MIL/uL (ref 3.87–5.11)
RDW: 14.3 % (ref 11.5–15.5)
WBC: 9.5 10*3/uL (ref 4.0–10.5)

## 2017-04-03 MED ORDER — ONDANSETRON HCL 4 MG/2ML IJ SOLN
4.0000 mg | Freq: Once | INTRAMUSCULAR | Status: AC
Start: 2017-04-03 — End: 2017-04-03
  Administered 2017-04-03: 4 mg via INTRAVENOUS
  Filled 2017-04-03: qty 2

## 2017-04-03 MED ORDER — SODIUM CHLORIDE 0.9 % IV BOLUS (SEPSIS)
1000.0000 mL | Freq: Once | INTRAVENOUS | Status: AC
  Administered 2017-04-03: 1000 mL via INTRAVENOUS

## 2017-04-03 NOTE — ED Triage Notes (Signed)
Pt with hx of hypokalemia via EMS from home. Pt reports this AM at 9 she began having severe diarrhea with muscle cramping beginning around 3PM. Pt reports she has had approx 24 episodes of diarrhea today. Pt also reports an episode of syncope while using the restroom. Pt A&Ox4. Pt pale. EMS VS: 190/112, 172 cbg. 22 G R wrist. 20 G L hand.

## 2017-04-03 NOTE — ED Provider Notes (Signed)
[facility] [facility] EMERGENCY DEPARTMENT Provider Note   CSN: [id number] Arrival date & time: 04/03/17  2302     History   Chief Complaint Chief Complaint  Patient presents with  . Nausea  . Vomiting  . Diarrhea    HPI Angel Sellers is a 57 y.o. female.  Patient presents via EMS with abdominal cramping, diarrhea, nausea and dry heaving.  Denies vomiting, denies fever.  Patient states she has had 25 episodes of loose stools since 9 AM.  Denies any blood in her stools.  She has had dry heaving and nausea but no vomiting.  No fever.  Has been around her grandchildren who have been sick though not with GI illnesses.  No recent antibiotic use or travel.  Patient states history of hypokalemia.  No previous history of abdominal problems.  She states this feels as though she had a colonoscopy preparation.  She had a syncopal episode while she was sitting on the toilet and slid to the floor but did not hit her head.  She was able to make it back to bed.  No chest pain or shortness of breath.   The history is provided by the patient and the EMS personnel.  Diarrhea   Associated symptoms include abdominal pain and vomiting. Pertinent negatives include no arthralgias, no myalgias and no cough.    Past Medical History:  Diagnosis Date  . Arthritis   . Cancer (HCC)    renal  . Chronic fatigue   . Fibromyalgia   . GERD (gastroesophageal reflux disease)   . Headache   . History of kidney cancer   . Hypertension   . Hypothyroidism   . Interstitial cystitis   . Neuromuscular disorder ([place])    nerve  . PONV (postoperative nausea and vomiting)    need scope patch  . POTS (postural orthostatic tachycardia syndrome)   . Thyroid disease     Patient Active Problem List   Diagnosis Date Noted  . Volume overload 09/21/2016  . Hypokalemia 09/21/2016  . Hypothyroidism 09/19/2016  . CAP (community acquired pneumonia) 09/19/2016  . Incisional hernia 03/31/2016  . Fusion of toes  02/26/2016  . Closed displaced fracture of middle phalanx of lesser toe of left foot   . History of reduction mammoplasty 04/16/2014  . Neck pain 08/09/2013  . Thumbnail injury 04/10/2013  . Chronic pain 05/28/2012  . Fibromyalgia 05/28/2012  . Postural orthostatic tachycardia syndrome 05/28/2012  . Chronic fatigue syndrome 09/07/2011  . Chronic interstitial cystitis without hematuria 09/07/2011  . Chronic migraine without aura 09/07/2011  . Irritable bowel syndrome without diarrhea 09/07/2011  . 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 Migraine headache 03/18/2008  . Essential hypertension 03/18/2008  . COUGH 03/18/2008    Past Surgical History:  Procedure Laterality Date  . ABDOMINAL HYSTERECTOMY  1999  . BONE BIOPSY Right 05/01/2013   Procedure: BIOPSY NAIL BED RIGHT THUMB, BIOPSY RIGHT GREAT TOE ;  Surgeon: Wynonia Sours, MD;  Location: [facility];  Service: Orthopedics;  Laterality: Right;  . CESAREAN SECTION     2 c-cestions  . COLONOSCOPY    . CYSTOSCOPY    . DISTAL INTERPHALANGEAL JOINT FUSION Left 02/26/2016   Procedure: LEFT 5TH TOE DISTAL INTERPHALANGEAL JOINT FUSION;  Surgeon: Newt Minion, MD;  Location: [facility];  Service: Orthopedics;  Laterality: Left;  . INCISIONAL HERNIA REPAIR N/A 03/31/2016   Procedure: LAPAROSCOPIC ASSISTED REPAIR OF INCISIONAL HERNIA;  Surgeon: [facility] Pickerel,  MD;  Location: [facility];  Service: General;  Laterality: N/A;  . INSERTION OF MESH N/A 03/31/2016   Procedure: INSERTION OF MESH;  Surgeon: [facility] Pickerel, MD;  Location[facility];  Service: General;  Laterality: N/A;  . KIDNEY SURGERY  2011   rt partial nephrectomy-[facility]  . TONSILLECTOMY      OB History    No data available       Home Medications    Prior to Admission medications   Medication Sig Start Date End Date Taking? Authorizing Provider  albuterol (PROVENTIL HFA;VENTOLIN HFA) 108 (90 Base) MCG/ACT inhaler Inhale 2 puffs into the lungs every 6  (six) hours as needed for wheezing or shortness of breath.    [provider]  amphetamine-dextroamphetamine (ADDERALL XR) 20 MG 24 hr capsule Take 20 mg by mouth daily.    [provider]  Cholecalciferol (VITAMIN D) 2000 units tablet Take 2,000 Units by mouth daily.    [provider]  DULoxetine (CYMBALTA) 30 MG capsule Take 30 mg by mouth daily.    [provider]  DULoxetine (CYMBALTA) 60 MG capsule Take 60 mg by mouth daily.    [provider]  feeding supplement, ENSURE ENLIVE, (ENSURE ENLIVE) LIQD Take 237 mLs by mouth 2 (two) times daily between meals. 09/22/16   Eugenie Filler, MD  levothyroxine (SYNTHROID, LEVOTHROID) 112 MCG tablet Take 112 mcg by mouth every other day.  11/22/15   [provider]  levothyroxine (SYNTHROID, LEVOTHROID) 125 MCG tablet Take 125 mcg by mouth every other day.  10/10/15   [provider]  methocarbamol (ROBAXIN) 500 MG tablet Take 500 mg by mouth 3 (three) times daily as needed (for muscle spasm/pain.).     [provider]  olmesartan (BENICAR) 5 MG tablet Take 5 mg by mouth daily.  11/22/15   [provider]  omeprazole (PRILOSEC) 40 MG capsule Take 40 mg by mouth daily.    [provider]  promethazine (PHENERGAN) 25 MG tablet Take 25 mg by mouth every 6 (six) hours as needed (for nausea/vomiting with migraines.).  02/06/12   [provider]  rizatriptan (MAXALT) 10 MG tablet Take 10 mg by mouth daily as needed for migraine. May repeat in 2 hours if needed    [provider]  tapentadol (NUCYNTA) 50 MG TABS tablet Take 25 mg by mouth daily as needed (for pain due to exertion/falls).     [provider]    Family History Family History  Problem Relation Age of Onset  . Melanoma Mother   . Kidney cancer Sister   . Kidney cancer Brother     Social History Social History   Tobacco Use  . Smoking status: Never Smoker  . Smokeless  tobacco: Never Used  Substance Use Topics  . Alcohol use: No  . Drug use: No     Allergies   Other; Dilaudid [hydromorphone hcl]; Ciprofloxacin; Lactose intolerance (gi); Meperidine; and Morphine   Review of Systems Review of Systems  Constitutional: Positive for activity change, appetite change and fatigue. Negative for fever.  HENT: Negative for congestion and rhinorrhea.   Respiratory: Negative for cough, chest tightness and shortness of breath.   Cardiovascular: Negative for chest pain.  Gastrointestinal: Positive for abdominal pain, diarrhea, nausea and vomiting.  Genitourinary: Negative for dysuria, vaginal bleeding and vaginal discharge.  Musculoskeletal: Negative for arthralgias and myalgias.  Neurological: Positive for dizziness, syncope, weakness and light-headedness. Negative for numbness.    all other systems are  negative except as noted in the HPI and PMH.    Physical Exam Updated Vital Signs BP (!) 174/116 (BP Location: Right Arm)   Pulse (!) 115   Temp 99.4 F (37.4 C) (Oral)   Resp (!) 9   Ht 5\' 5"  (1.651 m)   Wt 86.2 kg (190 lb)   SpO2 97%   BMI 31.62 kg/m   Physical Exam  Constitutional: She is oriented to person, place, and time. She appears well-developed and well-nourished. No distress.  HENT:  Head: Normocephalic and atraumatic.  Mouth/Throat: No oropharyngeal exudate.  Dry mucus membranes.  Pale appearing  Eyes: Conjunctivae and EOM are normal. Pupils are equal, round, and reactive to light.  Neck: Normal range of motion. Neck supple.  No meningismus.  Cardiovascular: Normal rate, normal heart sounds and intact distal pulses.  No murmur heard. Tachycardic 110s  Pulmonary/Chest: Effort normal and breath sounds normal. No respiratory distress.  Abdominal: Soft. There is tenderness. There is no rebound and no guarding.  Mild diffuse tenderness  Musculoskeletal: Normal range of motion. She exhibits no edema or tenderness.  Neurological: She is  alert and oriented to person, place, and time. No cranial nerve deficit. She exhibits normal muscle tone. Coordination normal.  No ataxia on finger to nose bilaterally. No pronator drift. 5/5 strength throughout. CN 2-12 intact.Equal grip strength. Sensation intact.   Skin: Skin is warm. Capillary refill takes less than 2 seconds.  Psychiatric: She has a normal mood and affect. Her behavior is normal.  Nursing note and vitals reviewed.    ED Treatments / Results  Labs (all labs ordered are listed, but only abnormal results are displayed) Labs Reviewed  COMPREHENSIVE METABOLIC PANEL - Abnormal; Notable for the following components:      Result Value   CO2 19 (*)    Glucose, Bld 142 (*)    Calcium 8.4 (*)    All other components within normal limits  I-STAT CHEM 8, ED - Abnormal; Notable for the following components:   Glucose, Bld 143 (*)    Calcium, Ion 1.05 (*)    All other components within normal limits  CBG MONITORING, ED - Abnormal; Notable for the following components:   Glucose-Capillary 129 (*)    All other components within normal limits  I-STAT CG4 LACTIC ACID, ED - Abnormal; Notable for the following components:   Lactic Acid, Venous 2.65 (*)    All other components within normal limits  C DIFFICILE QUICK SCREEN W PCR REFLEX  GASTROINTESTINAL PANEL BY PCR, STOOL (REPLACES STOOL CULTURE)  LIPASE, BLOOD  CBC  MAGNESIUM  URINALYSIS, ROUTINE W REFLEX MICROSCOPIC    EKG  EKG Interpretation  Date/Time:  Monday April 03 2017 23:05:44 EST Ventricular Rate:  111 PR Interval:    QRS Duration: 84 QT Interval:  333 QTC Calculation: 453 R Axis:   67 Text Interpretation:  Sinus tachycardia No previous ECGs available Confirmed by Ezequiel Essex [phone]) on 04/03/2017 11:57:37 PM       Radiology Dg Abdomen Acute W/chest  Result Date: 04/04/2017 CLINICAL DATA:  Abdominal pain EXAM: DG ABDOMEN ACUTE W/ 1V CHEST COMPARISON:  03/16/2017, CT 09/19/2016 FINDINGS:  Single-view chest demonstrates no consolidation or effusion. Normal heart size. Supine and upright views of the abdomen demonstrate no free air beneath the diaphragm. Nonspecific diffuse decreased bowel gas with scattered fluid levels over the right colon. No abnormal calcification. Phleboliths in the pelvis. IMPRESSION: 1. No radiographic evidence for acute cardiopulmonary abnormality 2. Nonspecific diffuse decreased bowel  gas. Electronically Signed   By: Donavan Foil M.D.   On: 04/04/2017 00:34    Procedures Procedures (including critical care time)  Medications Ordered in ED Medications  sodium chloride 0.9 % bolus 1,000 mL (not administered)  ondansetron (ZOFRAN) injection 4 mg (not administered)     Initial Impression / Assessment and Plan / ED Course  I have reviewed the triage vital signs and the nursing notes.  Pertinent labs & imaging results that were available during my care of the patient were reviewed by me and considered in my medical decision making (see chart for details).    Patient from home with 12 hours of diarrhea, dry heaving and abdominal cramping.  No fever.  She is tachycardic and dry appearing abdomen is soft but tender, no peritoneal signs  Patient given IV fluids and antiemetics.  She declines pain medication  Labs show mildly elevated lactate.  Potassium is normal.  Patient given IV fluids and antiemetics.  White blood cell count is normal. X-rays negative for bowel obstruction.  Patient with no peritoneal signs and a nonsurgical abdomen.  Stool sample sent to evaluate for C. difficile.  Patient did spike fever to 101.5 in the ED.  She remains tachycardic continues to have profuse diarrhea.  We will plan admission for continued hydration and observation.  Discussed with Dr. Blaine Hamper.   Final Clinical Impressions(s) / ED Diagnoses   Final diagnoses:  Diarrhea of infectious origin  Fever, unspecified fever cause    ED Discharge Orders    None         Monica Zahler, Annie Main, MD 04/04/17 0147

## 2017-04-03 NOTE — ED Notes (Signed)
ED Provider at bedside. 

## 2017-04-04 ENCOUNTER — Emergency Department (HOSPITAL_COMMUNITY): Payer: 59

## 2017-04-04 DIAGNOSIS — M797 Fibromyalgia: Secondary | ICD-10-CM | POA: Diagnosis present

## 2017-04-04 DIAGNOSIS — R197 Diarrhea, unspecified: Secondary | ICD-10-CM | POA: Diagnosis present

## 2017-04-04 DIAGNOSIS — K219 Gastro-esophageal reflux disease without esophagitis: Secondary | ICD-10-CM | POA: Diagnosis present

## 2017-04-04 DIAGNOSIS — G43909 Migraine, unspecified, not intractable, without status migrainosus: Secondary | ICD-10-CM

## 2017-04-04 DIAGNOSIS — Z8619 Personal history of other infectious and parasitic diseases: Secondary | ICD-10-CM | POA: Diagnosis not present

## 2017-04-04 DIAGNOSIS — R109 Unspecified abdominal pain: Secondary | ICD-10-CM | POA: Diagnosis not present

## 2017-04-04 DIAGNOSIS — Z905 Acquired absence of kidney: Secondary | ICD-10-CM | POA: Diagnosis not present

## 2017-04-04 DIAGNOSIS — F329 Major depressive disorder, single episode, unspecified: Secondary | ICD-10-CM | POA: Diagnosis present

## 2017-04-04 DIAGNOSIS — A419 Sepsis, unspecified organism: Secondary | ICD-10-CM | POA: Diagnosis not present

## 2017-04-04 DIAGNOSIS — A0811 Acute gastroenteropathy due to Norwalk agent: Secondary | ICD-10-CM | POA: Diagnosis not present

## 2017-04-04 DIAGNOSIS — I10 Essential (primary) hypertension: Secondary | ICD-10-CM | POA: Diagnosis not present

## 2017-04-04 DIAGNOSIS — F32A Depression, unspecified: Secondary | ICD-10-CM | POA: Diagnosis present

## 2017-04-04 DIAGNOSIS — A09 Infectious gastroenteritis and colitis, unspecified: Secondary | ICD-10-CM | POA: Diagnosis not present

## 2017-04-04 DIAGNOSIS — Z85528 Personal history of other malignant neoplasm of kidney: Secondary | ICD-10-CM | POA: Diagnosis not present

## 2017-04-04 DIAGNOSIS — E039 Hypothyroidism, unspecified: Secondary | ICD-10-CM | POA: Diagnosis not present

## 2017-04-04 DIAGNOSIS — Z8051 Family history of malignant neoplasm of kidney: Secondary | ICD-10-CM | POA: Diagnosis not present

## 2017-04-04 LAB — GASTROINTESTINAL PANEL BY PCR, STOOL (REPLACES STOOL CULTURE)
Adenovirus F40/41: NOT DETECTED
Astrovirus: NOT DETECTED
CAMPYLOBACTER SPECIES: NOT DETECTED
CRYPTOSPORIDIUM: NOT DETECTED
CYCLOSPORA CAYETANENSIS: NOT DETECTED
Entamoeba histolytica: NOT DETECTED
Enteroaggregative E coli (EAEC): NOT DETECTED
Enteropathogenic E coli (EPEC): NOT DETECTED
Enterotoxigenic E coli (ETEC): NOT DETECTED
Giardia lamblia: NOT DETECTED
Norovirus GI/GII: DETECTED — AB
PLESIMONAS SHIGELLOIDES: NOT DETECTED
ROTAVIRUS A: NOT DETECTED
SALMONELLA SPECIES: NOT DETECTED
SAPOVIRUS (I, II, IV, AND V): NOT DETECTED
SHIGA LIKE TOXIN PRODUCING E COLI (STEC): NOT DETECTED
SHIGELLA/ENTEROINVASIVE E COLI (EIEC): NOT DETECTED
VIBRIO SPECIES: NOT DETECTED
Vibrio cholerae: NOT DETECTED
YERSINIA ENTEROCOLITICA: NOT DETECTED

## 2017-04-04 LAB — CBC
HEMATOCRIT: 43.8 % (ref 36.0–46.0)
HEMOGLOBIN: 14.5 g/dL (ref 12.0–15.0)
MCH: 29.4 pg (ref 26.0–34.0)
MCHC: 33.1 g/dL (ref 30.0–36.0)
MCV: 88.8 fL (ref 78.0–100.0)
Platelets: 210 10*3/uL (ref 150–400)
RBC: 4.93 MIL/uL (ref 3.87–5.11)
RDW: 14.2 % (ref 11.5–15.5)
WBC: 7.9 10*3/uL (ref 4.0–10.5)

## 2017-04-04 LAB — COMPREHENSIVE METABOLIC PANEL
ALT: 34 U/L (ref 14–54)
ANION GAP: 12 (ref 5–15)
AST: 34 U/L (ref 15–41)
Albumin: 4 g/dL (ref 3.5–5.0)
Alkaline Phosphatase: 68 U/L (ref 38–126)
BUN: 9 mg/dL (ref 6–20)
CO2: 19 mmol/L — ABNORMAL LOW (ref 22–32)
Calcium: 8.4 mg/dL — ABNORMAL LOW (ref 8.9–10.3)
Chloride: 107 mmol/L (ref 101–111)
Creatinine, Ser: 0.84 mg/dL (ref 0.44–1.00)
GFR calc Af Amer: >60 mL/min (ref >60)
GLUCOSE: 142 mg/dL — AB (ref 65–99)
POTASSIUM: 3.8 mmol/L (ref 3.5–5.1)
Sodium: 138 mmol/L (ref 135–145)
Total Bilirubin: 0.5 mg/dL (ref 0.3–1.2)
Total Protein: 7.3 g/dL (ref 6.5–8.1)

## 2017-04-04 LAB — LACTIC ACID, PLASMA
LACTIC ACID, VENOUS: 2.6 mmol/L — AB (ref 0.5–1.9)
Lactic Acid, Venous: 3 mmol/L (ref 0.5–1.9)

## 2017-04-04 LAB — BASIC METABOLIC PANEL
ANION GAP: 14 (ref 5–15)
BUN: 8 mg/dL (ref 6–20)
CHLORIDE: 107 mmol/L (ref 101–111)
CO2: 19 mmol/L — AB (ref 22–32)
Calcium: 8.5 mg/dL — ABNORMAL LOW (ref 8.9–10.3)
Creatinine, Ser: 0.83 mg/dL (ref 0.44–1.00)
GFR calc Af Amer: >60 mL/min (ref >60)
GFR calc non Af Amer: >60 mL/min (ref >60)
GLUCOSE: 135 mg/dL — AB (ref 65–99)
POTASSIUM: 4.1 mmol/L (ref 3.5–5.1)
Sodium: 140 mmol/L (ref 135–145)

## 2017-04-04 LAB — C DIFFICILE QUICK SCREEN W PCR REFLEX
C DIFFICILE (CDIFF) INTERP: NOT DETECTED
C Diff antigen: NEGATIVE
C Diff toxin: NEGATIVE

## 2017-04-04 LAB — URINALYSIS, ROUTINE W REFLEX MICROSCOPIC
Bacteria, UA: NONE SEEN
Bilirubin Urine: NEGATIVE
Glucose, UA: NEGATIVE mg/dL
Ketones, ur: NEGATIVE mg/dL
Nitrite: NEGATIVE
Protein, ur: NEGATIVE mg/dL
Specific Gravity, Urine: 1.01 (ref 1.005–1.030)
pH: 5 (ref 5.0–8.0)

## 2017-04-04 LAB — MAGNESIUM: Magnesium: 2.1 mg/dL (ref 1.7–2.4)

## 2017-04-04 LAB — PROCALCITONIN: Procalcitonin: <0.1 ng/mL

## 2017-04-04 LAB — LIPASE, BLOOD: LIPASE: 26 U/L (ref 11–51)

## 2017-04-04 MED ORDER — SODIUM CHLORIDE 0.9 % IV BOLUS (SEPSIS)
500.0000 mL | Freq: Once | INTRAVENOUS | Status: AC
  Administered 2017-04-04: 500 mL via INTRAVENOUS

## 2017-04-04 MED ORDER — ACETAMINOPHEN 650 MG RE SUPP: 650.0000 mg | Freq: Four times a day (QID) | RECTAL | Status: DC | PRN

## 2017-04-04 MED ORDER — IRBESARTAN 75 MG PO TABS
37.5000 mg | ORAL_TABLET | Freq: Every day | ORAL | Status: DC
  Administered 2017-04-04 – 2017-04-05 (×2): 37.5 mg via ORAL
  Filled 2017-04-04 (×3): qty 0.5

## 2017-04-04 MED ORDER — ENSURE ENLIVE PO LIQD
237.0000 mL | Freq: Two times a day (BID) | ORAL | Status: DC
  Filled 2017-04-04: qty 237

## 2017-04-04 MED ORDER — SODIUM CHLORIDE 0.9 % IV BOLUS (SEPSIS)
1000.0000 mL | Freq: Once | INTRAVENOUS | Status: AC
  Administered 2017-04-04: 1000 mL via INTRAVENOUS

## 2017-04-04 MED ORDER — VITAMIN D 1000 UNITS PO TABS
2000.0000 [IU] | ORAL_TABLET | Freq: Every day | ORAL | Status: DC
  Administered 2017-04-05: 2000 [IU] via ORAL
  Filled 2017-04-04: qty 2

## 2017-04-04 MED ORDER — ZOLPIDEM TARTRATE 5 MG PO TABS: 5.0000 mg | ORAL_TABLET | Freq: Every evening | ORAL | Status: DC | PRN

## 2017-04-04 MED ORDER — SODIUM CHLORIDE 0.9 % IV SOLN
INTRAVENOUS | Status: DC
  Administered 2017-04-04 – 2017-04-05 (×3): via INTRAVENOUS

## 2017-04-04 MED ORDER — PROMETHAZINE HCL 25 MG/ML IJ SOLN
12.5000 mg | Freq: Once | INTRAMUSCULAR | Status: AC
  Administered 2017-04-04: 12.5 mg via INTRAVENOUS
  Filled 2017-04-04: qty 1

## 2017-04-04 MED ORDER — LEVOTHYROXINE SODIUM 112 MCG PO TABS
112.0000 ug | ORAL_TABLET | ORAL | Status: DC
  Administered 2017-04-05: 112 ug via ORAL
  Filled 2017-04-04: qty 1

## 2017-04-04 MED ORDER — LEVOTHYROXINE SODIUM 125 MCG PO TABS: 125.0000 ug | ORAL_TABLET | ORAL | Status: DC

## 2017-04-04 MED ORDER — DULOXETINE HCL 60 MG PO CPEP
90.0000 mg | ORAL_CAPSULE | Freq: Every day | ORAL | Status: DC
  Administered 2017-04-04 – 2017-04-05 (×2): 90 mg via ORAL
  Filled 2017-04-04: qty 1
  Filled 2017-04-04: qty 3

## 2017-04-04 MED ORDER — ENOXAPARIN SODIUM 40 MG/0.4ML ~~LOC~~ SOLN
40.0000 mg | Freq: Every day | SUBCUTANEOUS | Status: DC
  Administered 2017-04-04 – 2017-04-05 (×2): 40 mg via SUBCUTANEOUS
  Filled 2017-04-04 (×3): qty 0.4

## 2017-04-04 MED ORDER — TAPENTADOL HCL 50 MG PO TABS: 25.0000 mg | ORAL_TABLET | Freq: Every day | ORAL | Status: DC | PRN

## 2017-04-04 MED ORDER — PROMETHAZINE HCL 25 MG/ML IJ SOLN
25.0000 mg | Freq: Four times a day (QID) | INTRAMUSCULAR | Status: DC | PRN
  Administered 2017-04-04 (×2): 25 mg via INTRAVENOUS
  Filled 2017-04-04 (×2): qty 1

## 2017-04-04 MED ORDER — FAMOTIDINE 20 MG PO TABS
20.0000 mg | ORAL_TABLET | Freq: Every day | ORAL | Status: DC
  Administered 2017-04-04 – 2017-04-05 (×2): 20 mg via ORAL
  Filled 2017-04-04 (×2): qty 1

## 2017-04-04 MED ORDER — HYDRALAZINE HCL 20 MG/ML IJ SOLN: 5.0000 mg | INTRAMUSCULAR | Status: DC | PRN

## 2017-04-04 MED ORDER — SUMATRIPTAN SUCCINATE 50 MG PO TABS
50.0000 mg | ORAL_TABLET | ORAL | Status: DC | PRN
  Administered 2017-04-04 – 2017-04-05 (×3): 50 mg via ORAL
  Filled 2017-04-04 (×5): qty 1

## 2017-04-04 MED ORDER — METHOCARBAMOL 500 MG PO TABS
500.0000 mg | ORAL_TABLET | Freq: Three times a day (TID) | ORAL | Status: DC | PRN
  Administered 2017-04-04 – 2017-04-05 (×3): 500 mg via ORAL
  Filled 2017-04-04 (×3): qty 1

## 2017-04-04 MED ORDER — ACETAMINOPHEN 325 MG PO TABS
650.0000 mg | ORAL_TABLET | Freq: Four times a day (QID) | ORAL | Status: DC | PRN
  Administered 2017-04-04 (×2): 650 mg via ORAL
  Filled 2017-04-04 (×3): qty 2

## 2017-04-04 MED ORDER — DULOXETINE HCL 60 MG PO CPEP: 60.0000 mg | ORAL_CAPSULE | Freq: Every day | ORAL | Status: DC

## 2017-04-04 MED ORDER — ALBUTEROL SULFATE (2.5 MG/3ML) 0.083% IN NEBU: 3.0000 mL | INHALATION_SOLUTION | Freq: Four times a day (QID) | RESPIRATORY_TRACT | Status: DC | PRN

## 2017-04-04 NOTE — Progress Notes (Signed)
Received report on pt.

## 2017-04-04 NOTE — ED Notes (Signed)
Breakfast tray ordered 

## 2017-04-04 NOTE — Progress Notes (Signed)
Patient seen and examined, admitted early this morning by Dr.Niu, 57 year old female with history of renal CA, status post partial nephrectomy depression hypothyroidism fibromyalgia, history of C. difficile colitis admitted with severe diarrhea and nausea, with lactic acidosis and dehydration -Clinically suspect severe viral gastroenteritis, history of sick contact with grandson with diarrhea -Continue IV fluids today, PO intake is very poor -Follow-up GI pathogen panel, C. difficile PCR is negative -Advance diet as tolerated -Home pending clinical improvement  Domenic Polite MD

## 2017-04-04 NOTE — Progress Notes (Signed)
Angel Sellers is a 57 y.o. female patient admitted from ED awake, alert - oriented  X 4 - no acute distress noted.  VSS - Blood pressure 124/81, pulse 95, temperature 100.3 F (37.9 C), temperature source Oral, resp. rate 19, height 5\' 5"  (1.651 m), weight 86.2 kg (190 lb), SpO2 97 %.    IV in place, occlusive dsg intact without redness.  Orientation to room, and floor completed with information packet given to patient/family.  Patient declined safety video at this time.  Admission INP armband ID verified with patient/family, and in place.   SR up x 2, fall assessment complete, with patient and family able to verbalize understanding of risk associated with falls, and verbalized understanding to call nsg before up out of bed.  Call light within reach, patient able to voice, and demonstrate understanding.  Skin, clean-dry- intact without evidence of bruising, or skin tears.   No evidence of skin break down noted on exam.     Will cont to eval and treat per MD orders.  Celine Ahr, RN 04/04/2017 4:31 PM

## 2017-04-04 NOTE — ED Notes (Addendum)
[facility] lab called GI PCR result Pt is positive for Norovirus G1, G2. Initiate contact precautions.

## 2017-04-04 NOTE — ED Notes (Signed)
Patient transported to X-ray 

## 2017-04-04 NOTE — ED Notes (Signed)
Lab called lactic 3.0 value given to patient nurse [facility]

## 2017-04-04 NOTE — ED Notes (Signed)
Attempted report to 5W. 

## 2017-04-04 NOTE — H&P (Addendum)
History and Physical    Angel Sellers:[id number] DOB: 1961/01/19 DOA: 04/03/2017  Referring MD/NP/PA:   PCP: Lavone Orn, MD   Patient coming from:  The patient is coming from home.  At baseline, pt is independent for most of ADL.   Chief Complaint: diarrhea and fever   HPI: Angel Sellers is a 57 y.o. female with medical history significant of C. difficile colitis, hypertension, GERD, hypothyroidism, depression, interstitial cystitis, kidney cancer (s/p of right partial nephrectomy), fibromyalgia, migraine headaches, who presents with diarrhea and fever.  Patient states that she has been having severe diarrhea since yesterday. She has had 24 times of watery diarrhea so far. No recent antibiotics use. She has nausea, no vomiting. She has mild diffuse cramping abdominal pain. She states that she had chills and fever for 101.5 at home. Patient states that her grandson has 2 diarrhea today. Patient denies chest pain, shortness breath, cough, symptoms of UTI or unilateral weakness. She had faintingl episode while she was sitting on the toilet and slid to the floor but did not hit her head. She was able to make it back to bed.   ED Course: pt was found to have WBC 9.5, lipase 26, negative capacity 2.65, creatinine normal, temperature 99.4, tachycardia, oxygen saturation 97% on room air. X-ray of acute abdomen/chest is negative. Patient is placed on telemetry bed for observation.   Review of Systems:   General: has fevers, chills, no body weight gain, has poor appetite, has fatigue HEENT: no blurry vision, hearing changes or sore throat Respiratory: no dyspnea, coughing, wheezing CV: no chest pain, no palpitations GI: has diarrhea, nausea and abdominal pain, no constipation GU: no dysuria, burning on urination, increased urinary frequency, hematuria  Ext: no leg edema Neuro: no unilateral weakness, numbness, or tingling, no vision change or hearing loss Skin: no rash, no skin tear. MSK:  No muscle spasm, no deformity, no limitation of range of movement in spin Heme: No easy bruising.  Travel history: No recent long distant travel.  Allergy:  Allergies  Allergen Reactions  . Other Rash    Derma bond   . Dilaudid [Hydromorphone Hcl] Other (See Comments)    hypotension  . Ciprofloxacin Itching, Rash and Other (See Comments)    Chest tightness/severe vertigo  . Lactose Intolerance (Gi) Other (See Comments)    GI upset  . Meperidine Nausea And Vomiting  . Morphine Hives and Rash    Past Medical History:  Diagnosis Date  . Arthritis   . Cancer (HCC)    renal  . Chronic fatigue   . Fibromyalgia   . GERD (gastroesophageal reflux disease)   . Headache   . History of kidney cancer   . Hypertension   . Hypothyroidism   . Interstitial cystitis   . Neuromuscular disorder ([place])    nerve  . PONV (postoperative nausea and vomiting)    need scope patch  . POTS (postural orthostatic tachycardia syndrome)   . Thyroid disease     Past Surgical History:  Procedure Laterality Date  . ABDOMINAL HYSTERECTOMY  1999  . BONE BIOPSY Right 05/01/2013   Procedure: BIOPSY NAIL BED RIGHT THUMB, BIOPSY RIGHT GREAT TOE ;  Surgeon: Wynonia Sours, MD;  Location: [facility];  Service: Orthopedics;  Laterality: Right;  . CESAREAN SECTION     2 c-cestions  . COLONOSCOPY    . CYSTOSCOPY    . DISTAL INTERPHALANGEAL JOINT FUSION Left 02/26/2016   Procedure: LEFT 5TH TOE DISTAL  INTERPHALANGEAL JOINT FUSION;  Surgeon: Newt Minion, MD;  Location: [facility];  Service: Orthopedics;  Laterality: Left;  . INCISIONAL HERNIA REPAIR N/A 03/31/2016   Procedure: LAPAROSCOPIC ASSISTED REPAIR OF INCISIONAL HERNIA;  Surgeon: Greer Pickerel, MD;  Location: [facility];  Service: General;  Laterality: N/A;  . INSERTION OF MESH N/A 03/31/2016   Procedure: INSERTION OF MESH;  Surgeon: Greer Pickerel, MD;  Location: [facility];  Service: General;  Laterality: N/A;  . KIDNEY SURGERY  2011   rt partial  nephrectomy-[facility]  . TONSILLECTOMY      Social History:  reports that  has never smoked. she has never used smokeless tobacco. She reports that she does not drink alcohol or use drugs.  Family History:  Family History  Problem Relation Age of Onset  . Melanoma Mother   . Kidney cancer Sister   . Kidney cancer Brother      Prior to Admission medications   Medication Sig Start Date End Date Taking? Authorizing Provider  albuterol (PROVENTIL HFA;VENTOLIN HFA) 108 (90 Base) MCG/ACT inhaler Inhale 2 puffs into the lungs every 6 (six) hours as needed for wheezing or shortness of breath.    [provider]  amphetamine-dextroamphetamine (ADDERALL XR) 20 MG 24 hr capsule Take 20 mg by mouth daily.    [provider]  Cholecalciferol (VITAMIN D) 2000 units tablet Take 2,000 Units by mouth daily.    [provider]  DULoxetine (CYMBALTA) 30 MG capsule Take 30 mg by mouth daily.    [provider]  DULoxetine (CYMBALTA) 60 MG capsule Take 60 mg by mouth daily.    [provider]  feeding supplement, ENSURE ENLIVE, (ENSURE ENLIVE) LIQD Take 237 mLs by mouth 2 (two) times daily between meals. 09/22/16   Eugenie Filler, MD  levothyroxine (SYNTHROID, LEVOTHROID) 112 MCG tablet Take 112 mcg by mouth every other day.  11/22/15   [provider]  levothyroxine (SYNTHROID, LEVOTHROID) 125 MCG tablet Take 125 mcg by mouth every other day.  10/10/15   [provider]  methocarbamol (ROBAXIN) 500 MG tablet Take 500 mg by mouth 3 (three) times daily as needed (for muscle spasm/pain.).     [provider]  olmesartan (BENICAR) 5 MG tablet Take 5 mg by mouth daily.  11/22/15   [provider]  omeprazole (PRILOSEC) 40 MG capsule Take 40 mg by mouth daily.    [provider]  promethazine (PHENERGAN) 25 MG tablet Take 25 mg by mouth every 6 (six) hours as needed (for nausea/vomiting with migraines.).  02/06/12   [provider]  rizatriptan (MAXALT) 10 MG tablet Take 10 mg by mouth daily as needed for migraine. May repeat in 2 hours if needed    [provider]  tapentadol (NUCYNTA) 50 MG TABS tablet Take 25 mg by mouth daily as needed (for pain due to exertion/falls).     [provider]    Physical Exam: Vitals:   04/03/17 2304 04/04/17 0115 04/04/17 0125  BP: (!) 174/116 (!) 160/99   Pulse: (!) 115    Resp: (!) 9    Temp: 99.4 F (37.4 C)  (!) 101.5 F (38.6 C)  TempSrc: Oral  Oral  SpO2: 97%    Weight: 86.2 kg (190 lb)    Height: 5\' 5"  (1.651 m)     General: Not in acute distress HEENT:       Eyes: PERRL, EOMI, no scleral icterus.       ENT: No  discharge from the ears and nose, no pharynx injection, no tonsillar enlargement.        Neck: No JVD, no bruit, no mass felt. Heme: No neck lymph node enlargement. Cardiac: S1/S2, RRR, No murmurs, No gallops or rubs. Respiratory:  No rales, wheezing, rhonchi or rubs. GI: Soft, nondistended, has mild tenderness diffusely, no rebound pain, no organomegaly, BS present. GU: No hematuria Ext: No pitting leg edema bilaterally. 2+DP/PT pulse bilaterally. Musculoskeletal: No joint deformities, No joint redness or warmth, no limitation of ROM in spin. Skin: No rashes.  Neuro: Alert, oriented X3, cranial nerves II-XII grossly intact, moves all extremities normally.  Psych: Patient is not psychotic, no suicidal or hemocidal ideation.  Labs on Admission: I have personally reviewed following labs and imaging studies  CBC: Recent Labs  Lab 04/03/17 2307 04/03/17 2316  WBC  --  9.5  HGB 14.6 14.3  HCT 43.0 43.1  MCV  --  87.1  PLT  --  810   Basic Metabolic Panel: Recent Labs  Lab 04/03/17 2307 04/03/17 2316  NA 141 138  K 4.0 3.8  CL 105 107  CO2  --  19*  GLUCOSE 143* 142*  BUN 10 9  CREATININE 0.60 0.84  CALCIUM  --  8.4*  MG  --  2.1   GFR: Estimated Creatinine Clearance: 81.1 mL/min (by C-G formula based on  SCr of 0.84 mg/dL). Liver Function Tests: Recent Labs  Lab 04/03/17 2316  AST 34  ALT 34  ALKPHOS 68  BILITOT 0.5  PROT 7.3  ALBUMIN 4.0   Recent Labs  Lab 04/03/17 2316  LIPASE 26   No results for input(s): AMMONIA in the last 168 hours. Coagulation Profile: No results for input(s): INR, PROTIME in the last 168 hours. Cardiac Enzymes: No results for input(s): CKTOTAL, CKMB, CKMBINDEX, TROPONINI in the last 168 hours. BNP (last 3 results) No results for input(s): PROBNP in the last 8760 hours. HbA1C: No results for input(s): HGBA1C in the last 72 hours. CBG: Recent Labs  Lab 04/03/17 2308  GLUCAP 129*   Lipid Profile: No results for input(s): CHOL, HDL, LDLCALC, TRIG, CHOLHDL, LDLDIRECT in the last 72 hours. Thyroid Function Tests: No results for input(s): TSH, T4TOTAL, FREET4, T3FREE, THYROIDAB in the last 72 hours. Anemia Panel: No results for input(s): VITAMINB12, FOLATE, FERRITIN, TIBC, IRON, RETICCTPCT in the last 72 hours. Urine analysis:    Component Value Date/Time   COLORURINE YELLOW 09/19/2016 1714   APPEARANCEUR HAZY (A) 09/19/2016 1714   LABSPEC 1.019 09/19/2016 1714   PHURINE 6.0 09/19/2016 1714   GLUCOSEU NEGATIVE 09/19/2016 1714   HGBUR SMALL (A) 09/19/2016 1714   BILIRUBINUR NEGATIVE 09/19/2016 1714   KETONESUR 20 (A) 09/19/2016 1714   PROTEINUR 30 (A) 09/19/2016 1714   UROBILINOGEN 0.2 05/04/2011 1937   NITRITE NEGATIVE 09/19/2016 1714   LEUKOCYTESUR LARGE (A) 09/19/2016 1714   Sepsis Labs: @LABRCNTIP (procalcitonin:4,lacticidven:4) )No results found for this or any previous visit (from the past 240 hour(s)).   Radiological Exams on Admission: Dg Abdomen Acute W/chest  Result Date: 04/04/2017 CLINICAL DATA:  Abdominal pain EXAM: DG ABDOMEN ACUTE W/ 1V CHEST COMPARISON:  03/16/2017, CT 09/19/2016 FINDINGS: Single-view chest demonstrates no consolidation or effusion. Normal heart size. Supine and upright views of the abdomen demonstrate no  free air beneath the diaphragm. Nonspecific diffuse decreased bowel gas with scattered fluid levels over the right colon. No abnormal calcification. Phleboliths in the pelvis. IMPRESSION: 1. No radiographic evidence for acute cardiopulmonary abnormality 2. Nonspecific diffuse  decreased bowel gas. Electronically Signed   By: Donavan Foil M.D.   On: 04/04/2017 00:34     EKG: Independently reviewed.  Sinus rhythm, QTC 453, no ischemic change.   Assessment/Plan Principal Problem:   Diarrhea Active Problems:   Migraine headache   Essential hypertension   Hypothyroidism   GERD (gastroesophageal reflux disease)   Depression   Sepsis (HCC)   Diarrhea and sepsis: Patient's diarrhea is likely due to viral enteritis given sick contac with her grandson, but the patient has history of C. difficile colitis, will need to rule out this possibility. Patient admits grade 2 for sepsis with tachycardia and fever. Lactic acid is elevated at 2.65. Currently hemodynamically stable.  -will place on tele bed for obs -prn Phenergan for nausea and vomiting -IV fluids: 2.5 L normal saline, followed by 1 25 mL per hour -Follow up C. difficile PCR and GI pathogen panel which were ordered by EDP physician. -will get Procalcitonin and trend lactic acid levels per sepsis protocol. -f/u blood culture x 2  Addendum: lactic acid is up from 2.65-->3.0 -will give another L of NS bolus.  Hx of Migraine headache: -prn sumatriptan  HTN:  -irbesartan -IV hydralazine prn  Hypothyroidism: Last TSH was 5.659 on 09/22/16. Pt states that her Synthroid dose was tender to 112 2 weeks ago -Continue home Synthroid -Check TSH in 4 to 6 weeks  GERD (gastroesophageal reflux disease): -pepicid  Depression: Stable, no suicidal or homicidal ideations. -Continue home medications: Cymbalta   DVT ppx: sQ Lovenox Code Status: Full code Family Communication: None at bed side.     Disposition Plan:  Anticipate discharge back to  previous home environment Consults called:  none Admission status: Obs / tele    Date of Service 04/04/2017    Ivor Costa [facility] Pager [phone]  If 7PM-7AM, please contact night-coverage [email or web address] Password [email or web address] 04/04/2017, 1:55 AM

## 2017-04-04 NOTE — ED Notes (Signed)
Dr. Broadus John paged to [place] RN-paged by Levada Dy

## 2017-04-04 NOTE — ED Notes (Signed)
Dr. Broadus John notified about norovirus. NNO at this time.

## 2017-04-05 ENCOUNTER — Other Ambulatory Visit: Payer: Self-pay

## 2017-04-05 ENCOUNTER — Encounter (HOSPITAL_COMMUNITY): Payer: Self-pay | Admitting: General Practice

## 2017-04-05 DIAGNOSIS — A0811 Acute gastroenteropathy due to Norwalk agent: Principal | ICD-10-CM

## 2017-04-05 DIAGNOSIS — I10 Essential (primary) hypertension: Secondary | ICD-10-CM

## 2017-04-05 DIAGNOSIS — A09 Infectious gastroenteritis and colitis, unspecified: Secondary | ICD-10-CM

## 2017-04-05 DIAGNOSIS — E039 Hypothyroidism, unspecified: Secondary | ICD-10-CM

## 2017-04-05 LAB — BASIC METABOLIC PANEL
Anion gap: 9 (ref 5–15)
BUN: 5 mg/dL — ABNORMAL LOW (ref 6–20)
CHLORIDE: 108 mmol/L (ref 101–111)
CO2: 23 mmol/L (ref 22–32)
CREATININE: 0.79 mg/dL (ref 0.44–1.00)
Calcium: 8.2 mg/dL — ABNORMAL LOW (ref 8.9–10.3)
GFR calc Af Amer: >60 mL/min (ref >60)
GFR calc non Af Amer: >60 mL/min (ref >60)
Glucose, Bld: 97 mg/dL (ref 65–99)
POTASSIUM: 3.3 mmol/L — AB (ref 3.5–5.1)
Sodium: 140 mmol/L (ref 135–145)

## 2017-04-05 NOTE — Progress Notes (Signed)
Nutrition Brief Note  Patient identified on the Malnutrition Screening Tool (MST) Report  Wt Readings from Last 15 Encounters:  04/04/17 198 lb 13.7 oz (90.2 kg)  01/25/17 189 lb (85.7 kg)  01/11/17 188 lb 3.2 oz (85.4 kg)  09/22/16 189 lb 12.8 oz (86.1 kg)  03/31/16 192 lb (87.1 kg)  03/24/16 192 lb (87.1 kg)  03/23/16 192 lb 9.6 oz (87.4 kg)  03/09/16 190 lb (86.2 kg)  02/19/16 190 lb 14.4 oz (86.6 kg)  01/28/16 185 lb (83.9 kg)  12/31/15 185 lb (83.9 kg)  05/01/13 190 lb (86.2 kg)  05/04/11 165 lb (74.8 kg)   Angel Sellers is a 57 y.o. female with medical history significant of C. difficile colitis, hypertension, GERD, hypothyroidism, depression, interstitial cystitis, kidney cancer (s/p of right partial nephrectomy), fibromyalgia, migraine headaches, who presents with diarrhea and fever.  Spoke with Angel Sellers briefly. She says she had 2 days of diarrhea prior to admit but feels much better today. Had eaten crackers prior to my visit, stomach was ok. Also had jello this morning. Denies any weight loss. Prior to onset of illness, PO good with 2-3 meals a day. Also says he is no longer having diarrhea every 30 mins, and feels much better in that regard.  Body mass index is 33.09 kg/m. Patient meets criteria for obese class I based on current BMI.   Current diet order is heart healthy, patient is consuming approximately 100% of meals at this time. Labs and medications reviewed.   No nutrition interventions warranted at this time. If nutrition issues arise, please consult RD.   Angel Anis. Elvera Almario, MS, RD LDN Inpatient Clinical Dietitian Pager [phone]

## 2017-04-05 NOTE — Progress Notes (Signed)
Discharge instructions (including medications) discussed with and copy provided to patient/caregiver 

## 2017-04-05 NOTE — Discharge Summary (Signed)
Physician Discharge Summary  Angel Sellers GYI:[id number] DOB: Jun 13, 1960 DOA: 04/03/2017  PCP: Lavone Orn, MD  Admit date: 04/03/2017 Discharge date: 04/05/2017  Recommendations for Outpatient Follow-up:  1. As needed  Follow-up Information    Lavone Orn, MD Follow up.   Specialty:  Internal Medicine Why:  as needed Contact information: [address] [place] [address] [phone]            Discharge Diagnoses:  1. Norovirus gastroenteritis, diarrhea 2. Essential HTN 3. hypothyroidism  Discharge Condition: improved Disposition: home  Diet recommendation: heart healthy  Filed Weights   04/03/17 2304 04/04/17 1637  Weight: 86.2 kg (190 lb) 90.2 kg (198 lb 13.7 oz)    History of present illness:  52yow PMH cdiff colitis presented with acute diarrhea and fever. No recent abx. Sick contact with grandson. Admitted for diarrhea, possible sepsis.   Hospital Course:  Treated with supportive care with rapid resolution of diarrhea. Stool positive for Norovirus. Now tolerating diet and symptoms resolved. Hospitalization uncomplicated.   Sepsis ruled out. HTN and hypothyroidism appear stable.  Today's assessment: S: feels good. Tolerating diet. No n/v/diarrhea. No pain. O: Vitals:  Vitals:   04/05/17 0601 04/05/17 1359  BP: (!) 101/54 (!) 110/58  Pulse: 87 95  Resp: 17 18  Temp: 98.6 F (37 C) 99.2 F (37.3 C)  SpO2: 99% 98%    Constitutional:  . Appears calm and comfortable. Appears well. Respiratory:  . CTA bilaterally, no w/r/r.  . Respiratory effort normal Cardiovascular:  . RRR, no m/r/g Abdomen:  . Soft, ntnd Psychiatric:  . judgement and insight appear normal . Mental status o Mood, affect appropriate  BMP noted  Discharge Instructions  Discharge Instructions    Diet - low sodium heart healthy   Complete by:  As directed    Discharge instructions   Complete by:  As directed    Call your physician or seek  immediate medical attention for ongoing fever, recurrent diarrhea, vomiting or worsening of condition.   Increase activity slowly   Complete by:  As directed    Increase activity slowly   Complete by:  As directed      Allergies as of 04/05/2017      Reactions   Other Rash   Derma bond   Dilaudid [hydromorphone Hcl] Other (See Comments)   hypotension   Ciprofloxacin Itching, Rash, Other (See Comments)   Chest tightness/severe vertigo   Lactose Intolerance (gi) Other (See Comments)   GI upset   Meperidine Nausea And Vomiting   Morphine Hives, Rash      Medication List    TAKE these medications   albuterol 108 (90 Base) MCG/ACT inhaler Commonly known as:  PROVENTIL HFA;VENTOLIN HFA Inhale 2 puffs into the lungs every 6 (six) hours as needed for wheezing or shortness of breath.   amphetamine-dextroamphetamine 20 MG 24 hr capsule Commonly known as:  ADDERALL XR Take 20 mg by mouth daily.   DULoxetine 30 MG capsule Commonly known as:  CYMBALTA Take 90 mg by mouth daily.   DULoxetine 60 MG capsule Commonly known as:  CYMBALTA Take 90 mg by mouth daily.   levothyroxine 112 MCG tablet Commonly known as:  SYNTHROID, LEVOTHROID Take 112 mcg by mouth every other day.   olmesartan 5 MG tablet Commonly known as:  BENICAR Take 5 mg by mouth daily.   omeprazole 40 MG capsule Commonly known as:  PRILOSEC Take 40 mg by mouth daily.   promethazine 25 MG  tablet Commonly known as:  PHENERGAN Take 25 mg by mouth every 6 (six) hours as needed (for nausea/vomiting with migraines.).   rizatriptan 10 MG tablet Commonly known as:  MAXALT Take 10 mg by mouth daily as needed for migraine. May repeat in 2 hours if needed   ROBAXIN 500 MG tablet Generic drug:  methocarbamol Take 500 mg by mouth 3 (three) times daily as needed (for muscle spasm/pain.).   tapentadol 50 MG tablet Commonly known as:  NUCYNTA Take 25 mg by mouth daily as needed (for pain due to exertion/falls).     Vitamin D 2000 units tablet Take 2,000 Units by mouth daily.      Allergies  Allergen Reactions  . Other Rash    Derma bond   . Dilaudid [Hydromorphone Hcl] Other (See Comments)    hypotension  . Ciprofloxacin Itching, Rash and Other (See Comments)    Chest tightness/severe vertigo  . Lactose Intolerance (Gi) Other (See Comments)    GI upset  . Meperidine Nausea And Vomiting  . Morphine Hives and Rash    The results of significant diagnostics from this hospitalization (including imaging, microbiology, ancillary and laboratory) are listed below for reference.    Significant Diagnostic Studies: Dg Abdomen Acute W/chest  Result Date: 04/04/2017 CLINICAL DATA:  Abdominal pain EXAM: DG ABDOMEN ACUTE W/ 1V CHEST COMPARISON:  03/16/2017, CT 09/19/2016 FINDINGS: Single-view chest demonstrates no consolidation or effusion. Normal heart size. Supine and upright views of the abdomen demonstrate no free air beneath the diaphragm. Nonspecific diffuse decreased bowel gas with scattered fluid levels over the right colon. No abnormal calcification. Phleboliths in the pelvis. IMPRESSION: 1. No radiographic evidence for acute cardiopulmonary abnormality 2. Nonspecific diffuse decreased bowel gas. Electronically Signed   By: Donavan Foil M.D.   On: 04/04/2017 00:34    Microbiology: Recent Results (from the past 240 hour(s))  Culture, blood (x 2)     Status: None (Preliminary result)   Collection Time: 04/03/17 11:09 PM  Result Value Ref Range Status   Specimen Description BLOOD LEFT FOREARM  Final   Special Requests IN PEDIATRIC BOTTLE Blood Culture adequate volume  Final   Culture   Final    NO GROWTH 1 DAY Performed at [facility], [address] [place], [place] [address]    Report Status PENDING  Incomplete  C difficile quick scan w PCR reflex     Status: None   Collection Time: 04/03/17 11:19 PM  Result Value Ref Range Status   C Diff antigen NEGATIVE NEGATIVE Final   C Diff  toxin NEGATIVE NEGATIVE Final   C Diff interpretation No C. difficile detected.  Final  Gastrointestinal Panel by PCR , Stool     Status: Abnormal   Collection Time: 04/03/17 11:19 PM  Result Value Ref Range Status   Campylobacter species NOT DETECTED NOT DETECTED Final   Plesimonas shigelloides NOT DETECTED NOT DETECTED Final   Salmonella species NOT DETECTED NOT DETECTED Final   Yersinia enterocolitica NOT DETECTED NOT DETECTED Final   Vibrio species NOT DETECTED NOT DETECTED Final   Vibrio cholerae NOT DETECTED NOT DETECTED Final   Enteroaggregative E coli (EAEC) NOT DETECTED NOT DETECTED Final   Enteropathogenic E coli (EPEC) NOT DETECTED NOT DETECTED Final   Enterotoxigenic E coli (ETEC) NOT DETECTED NOT DETECTED Final   Shiga like toxin producing E coli (STEC) NOT DETECTED NOT DETECTED Final   Shigella/Enteroinvasive E coli (EIEC) NOT DETECTED NOT DETECTED Final   Cryptosporidium NOT  DETECTED NOT DETECTED Final   Cyclospora cayetanensis NOT DETECTED NOT DETECTED Final   Entamoeba histolytica NOT DETECTED NOT DETECTED Final   Giardia lamblia NOT DETECTED NOT DETECTED Final   Adenovirus F40/41 NOT DETECTED NOT DETECTED Final   Astrovirus NOT DETECTED NOT DETECTED Final   Norovirus GI/GII DETECTED (A) NOT DETECTED Final    Comment: RESULT CALLED TO, READ BACK BY AND VERIFIED WITH: NIKKI KOHUT AT 2426 04/04/17 SDR    Rotavirus A NOT DETECTED NOT DETECTED Final   Sapovirus (I, II, IV, and V) NOT DETECTED NOT DETECTED Final    Comment: Performed at [facility], [address] [place], [place] [address]  Culture, blood (x 2)     Status: None (Preliminary result)   Collection Time: 04/04/17  2:49 AM  Result Value Ref Range Status   Specimen Description BLOOD RIGHT HAND  Final   Special Requests IN PEDIATRIC BOTTLE Blood Culture adequate volume  Final   Culture   Final    NO GROWTH 1 DAY Performed at [facility], [place] [address] [place],  [address]     Report Status PENDING  Incomplete     Labs: Basic Metabolic Panel: Recent Labs  Lab 04/03/17 2307 04/03/17 2316 04/04/17 0249 04/05/17 0738  NA 141 138 140 140  K 4.0 3.8 4.1 3.3*  CL 105 107 107 108  CO2  --  19* 19* 23  GLUCOSE 143* 142* 135* 97  BUN 10 9 8  5*  CREATININE 0.60 0.84 0.83 0.79  CALCIUM  --  8.4* 8.5* 8.2*  MG  --  2.1  --   --    Liver Function Tests: Recent Labs  Lab 04/03/17 2316  AST 34  ALT 34  ALKPHOS 68  BILITOT 0.5  PROT 7.3  ALBUMIN 4.0   Recent Labs  Lab 04/03/17 2316  LIPASE 26   CBC: Recent Labs  Lab 04/03/17 2307 04/03/17 2316 04/04/17 0249  WBC  --  9.5 7.9  HGB 14.6 14.3 14.5  HCT 43.0 43.1 43.8  MCV  --  87.1 88.8  PLT  --  224 210    CBG: Recent Labs  Lab 04/03/17 2308  GLUCAP 129*    Principal Problem:   Diarrhea Active Problems:   Migraine headache   Essential hypertension   Hypothyroidism   GERD (gastroesophageal reflux disease)   Depression   Sepsis ([place])   Time coordinating discharge: 35 minutes  Signed:  Murray Hodgkins, MD [facility] 04/05/2017, 5:12 PM

## 2017-04-06 NOTE — Consult Note (Signed)
            [facility] CM Primary Care Navigator  04/06/2017  Angel [facility] [birth year] [id number]   Went to seepatient at the bedside to identify possible discharge needsbutshe was already discharged per staff report. Patient was discharged home yesterday.  Per MD note, patient was seen for acute diarrhea and fever (positive for Norovirus).  Primary care provider's office is listed as providing transition of care (TOC).    For additional questions please contact:  Edwena Felty A. Takeru Bose, BSN, RN-BC [facility] PRIMARY CARE Navigator Cell: [phone]

## 2017-04-09 LAB — CULTURE, BLOOD (ROUTINE X 2)
Culture: NO GROWTH
Culture: NO GROWTH
SPECIAL REQUESTS: ADEQUATE
Special Requests: ADEQUATE

## 2017-05-01 ENCOUNTER — Ambulatory Visit (INDEPENDENT_AMBULATORY_CARE_PROVIDER_SITE_OTHER): Payer: 59 | Admitting: Podiatry

## 2017-05-01 ENCOUNTER — Ambulatory Visit (INDEPENDENT_AMBULATORY_CARE_PROVIDER_SITE_OTHER): Payer: 59

## 2017-05-01 ENCOUNTER — Encounter: Payer: Self-pay | Admitting: Podiatry

## 2017-05-01 DIAGNOSIS — B353 Tinea pedis: Secondary | ICD-10-CM | POA: Diagnosis not present

## 2017-05-01 DIAGNOSIS — R600 Localized edema: Secondary | ICD-10-CM

## 2017-05-01 DIAGNOSIS — R609 Edema, unspecified: Secondary | ICD-10-CM

## 2017-05-01 DIAGNOSIS — M7989 Other specified soft tissue disorders: Secondary | ICD-10-CM

## 2017-05-01 MED ORDER — TERBINAFINE HCL 250 MG PO TABS: 250.0000 mg | ORAL_TABLET | Freq: Every day | ORAL | 0 refills | Status: DC

## 2017-05-01 NOTE — Patient Instructions (Signed)

## 2017-05-01 NOTE — Progress Notes (Signed)
Subjective:    Patient ID: Angel Sellers, female    DOB: 1960-08-27, 57 y.o.   MRN: [id number]  HPI 56 year old female presents the office today for concerns of itching to her feet between her toes.  She states that she was doing better and she was using Lotrimin cream over-the-counter which did help as she is gone back to exercising using a treadmill she has noticed that her feet is starting to itch again.  She states she will occasionally get some burning to her toes as well.  In between her toes will itch as well.  This is been ongoing about 5 months.  She has no recent injury or trauma she is able to recall.  She says her blood sugar has been elevated but she is trying to work on losing weight to get the blood sugar down.  She has no other concerns today.  Review of Systems  All other systems reviewed and are negative.  Past Medical History:  Diagnosis Date  . Arthritis   . Cancer (HCC)    renal  . Chronic fatigue   . Fibromyalgia   . GERD (gastroesophageal reflux disease)   . Headache   . History of kidney cancer   . Hypertension   . Hypothyroidism   . Interstitial cystitis   . Neuromuscular disorder ([place])    nerve  . Norovirus 03/2017  . PONV (postoperative nausea and vomiting)    need scope patch  . POTS (postural orthostatic tachycardia syndrome)   . Thyroid disease     Past Surgical History:  Procedure Laterality Date  . ABDOMINAL HYSTERECTOMY  1999  . BONE BIOPSY Right 05/01/2013   Procedure: BIOPSY NAIL BED RIGHT THUMB, BIOPSY RIGHT GREAT TOE ;  Surgeon: Wynonia Sours, MD;  Location: [place];  Service: Orthopedics;  Laterality: Right;  . CESAREAN SECTION     2 c-cestions  . COLONOSCOPY    . CYSTOSCOPY    . DISTAL INTERPHALANGEAL JOINT FUSION Left 02/26/2016   Procedure: LEFT 5TH TOE DISTAL INTERPHALANGEAL JOINT FUSION;  Surgeon: Newt Minion, MD;  Location: [place];  Service: Orthopedics;  Laterality: Left;  . INCISIONAL HERNIA REPAIR N/A  03/31/2016   Procedure: LAPAROSCOPIC ASSISTED REPAIR OF INCISIONAL HERNIA;  Surgeon: Greer Pickerel, MD;  [facility];  Service: General;  Laterality: N/A;  . INSERTION OF MESH N/A 03/31/2016   Procedure: INSERTION OF MESH;  Surgeon: Greer Pickerel, MD;  [facility];  Service: General;  Laterality: N/A;  . KIDNEY SURGERY  2011   rt partial nephrectomy-[facility]  . TONSILLECTOMY       Current Outpatient Medications:  .  albuterol (PROVENTIL HFA;VENTOLIN HFA) 108 (90 Base) MCG/ACT inhaler, Inhale 2 puffs into the lungs every 6 (six) hours as needed for wheezing or shortness of breath., Disp: , Rfl:  .  amphetamine-dextroamphetamine (ADDERALL XR) 20 MG 24 hr capsule, Take 20 mg by mouth daily., Disp: , Rfl:  .  Cholecalciferol (VITAMIN D) 2000 units tablet, Take 2,000 Units by mouth daily., Disp: , Rfl:  .  DULoxetine (CYMBALTA) 30 MG capsule, Take 90 mg by mouth daily. , Disp: , Rfl:  .  DULoxetine (CYMBALTA) 60 MG capsule, Take 90 mg by mouth daily. , Disp: , Rfl:  .  levothyroxine (SYNTHROID, LEVOTHROID) 112 MCG tablet, Take 112 mcg by mouth every other day. , Disp: , Rfl:  .  methocarbamol (ROBAXIN) 500 MG tablet, Take 500 mg by mouth 3 (three) times daily as  needed (for muscle spasm/pain.). , Disp: , Rfl:  .  olmesartan (BENICAR) 5 MG tablet, Take 5 mg by mouth daily. , Disp: , Rfl:  .  omeprazole (PRILOSEC) 40 MG capsule, Take 40 mg by mouth daily., Disp: , Rfl:  .  promethazine (PHENERGAN) 25 MG tablet, Take 25 mg by mouth every 6 (six) hours as needed (for nausea/vomiting with migraines.). , Disp: , Rfl:  .  rizatriptan (MAXALT) 10 MG tablet, Take 10 mg by mouth daily as needed for migraine. May repeat in 2 hours if needed, Disp: , Rfl:  .  tapentadol (NUCYNTA) 50 MG TABS tablet, Take 25 mg by mouth daily as needed (for pain due to exertion/falls). , Disp: , Rfl:  .  terbinafine (LAMISIL) 250 MG tablet, Take 1 tablet (250 mg total) by mouth daily., Disp: 14 tablet, Rfl: 0  Allergies    Allergen Reactions  . Other Rash    Derma bond   . Dilaudid [Hydromorphone Hcl] Other (See Comments)    hypotension  . Ciprofloxacin Itching, Rash and Other (See Comments)    Chest tightness/severe vertigo  . Lactose Intolerance (Gi) Other (See Comments)    GI upset  . Meperidine Nausea And Vomiting  . Morphine Hives and Rash    Social History   Socioeconomic History  . Marital status: Married    Spouse name: Not on file  . Number of children: Not on file  . Years of education: Not on file  . Highest education level: Not on file  Social Needs  . Financial resource strain: Not on file  . Food insecurity - worry: Not on file  . Food insecurity - inability: Not on file  . Transportation needs - medical: Not on file  . Transportation needs - non-medical: Not on file  Occupational History  . Not on file  Tobacco Use  . Smoking status: Never Smoker  . Smokeless tobacco: Never Used  Substance and Sexual Activity  . Alcohol use: No  . Drug use: No  . Sexual activity: Not on file  Other Topics Concern  . Not on file  Social History Narrative  . Not on file         Objective:   Physical Exam  General: AAO x3, NAD  Dermatological: Interdigitally there is a small amount of cracking within the interspaces.  There is no drainage or pus or any swelling or redness.  Subjectively between in between the toes it does itch but she states that is better today than what it has been.  She states that it fluctuates.  There is no open lesions or pre-ulcerative lesions.  Vascular: Dorsalis Pedis artery and Posterior Tibial artery pedal pulses are 2/4 bilateral with immedate capillary fill time.  There is no pain with calf compression, swelling, warmth, erythema.   Neruologic: Grossly intact via light touch bilateral.  Protective threshold with Semmes Wienstein monofilament intact to all pedal sites bilateral.   Musculoskeletal: There is no significant edema, erythema, increase in  warmth bilaterally.  Subjectively she will noticed the skin gets somewhat swollen or red but currently not experiencing this.  Muscular strength 5/5 in all groups tested bilateral.  Gait: Unassisted, Nonantalgic.      Assessment & Plan:  57 year old female with likely interdigital tinea pedis -Treatment options discussed including all alternatives, risks, and complications -X-rays were obtained and reviewed with the patient.  No evidence of acute fracture or stress fracture identified. -Etiology of symptoms were discussed -We discussed treatment options.  At this point she is attempted creams without any significant resolution.  We discussed 2-week course of Lamisil discussed potential side effects of this as well as medication interactions.  She will continue to monitor very closely for this. -We discussed external measures to help prevent this.  She states that her feet do not swell out if it does we can consider adding drysol.  -She has some numbness and tingling to her toes.  This may be an early neuropathy overlapping tinea pedis.  Will monitor for this. -Follow-up if symptoms not resolved next couple weeks or sooner if needed.  Call any questions or concerns.  Trula Slade DPM

## 2017-05-09 DIAGNOSIS — E039 Hypothyroidism, unspecified: Secondary | ICD-10-CM | POA: Diagnosis not present

## 2017-05-09 DIAGNOSIS — R7301 Impaired fasting glucose: Secondary | ICD-10-CM | POA: Diagnosis not present

## 2017-05-09 DIAGNOSIS — E781 Pure hyperglyceridemia: Secondary | ICD-10-CM | POA: Diagnosis not present

## 2017-05-18 ENCOUNTER — Ambulatory Visit (INDEPENDENT_AMBULATORY_CARE_PROVIDER_SITE_OTHER): Payer: 59 | Admitting: Podiatry

## 2017-05-18 DIAGNOSIS — B353 Tinea pedis: Secondary | ICD-10-CM | POA: Diagnosis not present

## 2017-05-18 DIAGNOSIS — L819 Disorder of pigmentation, unspecified: Secondary | ICD-10-CM

## 2017-05-18 MED ORDER — CLOTRIMAZOLE-BETAMETHASONE 1-0.05 % EX CREA: 1.0000 "application " | TOPICAL_CREAM | Freq: Two times a day (BID) | CUTANEOUS | 0 refills | Status: DC

## 2017-05-22 ENCOUNTER — Telehealth: Payer: Self-pay | Admitting: *Deleted

## 2017-05-22 DIAGNOSIS — L819 Disorder of pigmentation, unspecified: Secondary | ICD-10-CM

## 2017-05-22 NOTE — Telephone Encounter (Signed)
Orders faxed to [facility]. 

## 2017-05-22 NOTE — Telephone Encounter (Signed)
-----   Message from Trula Slade, DPM sent at 05/19/2017  1:26 PM EDT ----- Can you please order an ABI with TBI due to discoloration to skin? Thanks.

## 2017-05-23 NOTE — Progress Notes (Signed)
Subjective: 58 year old female presents the office today for follow-up evaluation of itchy, burning to her feet.  She also has new concerns that she shows me pictures of her feet turning red at times.  She states that she recommended Lamisil and this helped with the itching significantly but since stopping medication she is starting on some itching when intact.  The nerve symptoms are acute or unchanged.  She is more concerned with a discoloration to her foot today as well.  No recent injury she has no other concerns. Denies any systemic complaints such as fevers, chills, nausea, vomiting. No acute changes since last appointment, and no other complaints at this time.   Objective: AAO x3, NAD DP/PT pulses palpable bilaterally, CRT less than 3 seconds There is minimal interdigital tinea pedis present today but there is no open sores and there is no peeling or cracking skin.  The foot is of normal coloration site but she does show me pictures with her foot has been turning brighter red at times.  She denies seeing any white discoloration.  No open lesions or pre-ulcerative lesions.  No pain with calf compression, swelling, warmth, erythema  Assessment: Resolving tinea pedis; neuritis symptoms with concern for arterial disease  Plan: -All treatment options discussed with the patient including all alternatives, risks, complications.  -Kidney discoloration or fluid arm in order arterial studies.  This is ordered today.  In regards to the fungus I do think this was component of it because she got improvement with Lamisil.  She also had no side effects the medication.  Discussed with her topical antifungal to apply interdigitally.  Discussed Lotrisone cream. -Follow-up after arterial studies or sooner if needed. -Patient encouraged to call the office with any questions, concerns, change in symptoms.   Trula Slade DPM

## 2017-05-30 ENCOUNTER — Telehealth: Payer: Self-pay | Admitting: *Deleted

## 2017-05-30 NOTE — Telephone Encounter (Signed)
I informed pt that the orders had been placed on 05/22/2017 and she could contact the [facility] to schedule for her convenience.

## 2017-05-30 NOTE — Telephone Encounter (Signed)
Pt states she has not received a call from [facility].

## 2017-06-01 ENCOUNTER — Ambulatory Visit (HOSPITAL_COMMUNITY)
Admission: RE | Admit: 2017-06-01 | Discharge: 2017-06-01 | Disposition: A | Discharge status: Home / Self Care | Payer: 59 | Source: Ambulatory Visit | Attending: Cardiovascular Disease | Admitting: Cardiovascular Disease

## 2017-06-01 DIAGNOSIS — L819 Disorder of pigmentation, unspecified: Secondary | ICD-10-CM | POA: Diagnosis not present

## 2017-06-01 DIAGNOSIS — I10 Essential (primary) hypertension: Secondary | ICD-10-CM | POA: Diagnosis not present

## 2017-06-09 ENCOUNTER — Ambulatory Visit: Payer: Medicare Other | Admitting: Psychology

## 2017-06-13 ENCOUNTER — Telehealth: Payer: Self-pay | Admitting: *Deleted

## 2017-06-13 NOTE — Telephone Encounter (Signed)
I informed pt of Dr. Wagoner's review of results. 

## 2017-06-13 NOTE — Telephone Encounter (Signed)
-----   Message from Trula Slade, DPM sent at 06/08/2017  5:21 PM EDT ----- Please let her know that her circulation for the most part is normal. There was some decrease to the toes. We will watch this but there is no overall significant decrease.

## 2017-06-23 ENCOUNTER — Encounter

## 2017-06-23 ENCOUNTER — Ambulatory Visit: Payer: Medicare Other | Admitting: Psychology

## 2017-06-23 DIAGNOSIS — M549 Dorsalgia, unspecified: Secondary | ICD-10-CM | POA: Diagnosis not present

## 2017-07-04 ENCOUNTER — Other Ambulatory Visit: Payer: Self-pay | Admitting: Physician Assistant

## 2017-07-04 DIAGNOSIS — L259 Unspecified contact dermatitis, unspecified cause: Secondary | ICD-10-CM | POA: Diagnosis not present

## 2017-07-04 DIAGNOSIS — L570 Actinic keratosis: Secondary | ICD-10-CM | POA: Diagnosis not present

## 2017-08-03 DIAGNOSIS — M797 Fibromyalgia: Secondary | ICD-10-CM | POA: Diagnosis not present

## 2017-08-15 ENCOUNTER — Ambulatory Visit (INDEPENDENT_AMBULATORY_CARE_PROVIDER_SITE_OTHER): Payer: 59 | Admitting: Allergy and Immunology

## 2017-08-15 ENCOUNTER — Encounter: Payer: Self-pay | Admitting: Allergy and Immunology

## 2017-08-15 VITALS — BP 118/72 | HR 110 | Temp 98.2°F | Resp 20 | Ht 65.5 in | Wt 193.0 lb

## 2017-08-15 DIAGNOSIS — K219 Gastro-esophageal reflux disease without esophagitis: Secondary | ICD-10-CM | POA: Diagnosis not present

## 2017-08-15 DIAGNOSIS — L308 Other specified dermatitis: Secondary | ICD-10-CM | POA: Diagnosis not present

## 2017-08-15 DIAGNOSIS — L989 Disorder of the skin and subcutaneous tissue, unspecified: Secondary | ICD-10-CM

## 2017-08-15 DIAGNOSIS — L299 Pruritus, unspecified: Secondary | ICD-10-CM | POA: Diagnosis not present

## 2017-08-15 MED ORDER — RANITIDINE HCL 300 MG PO CAPS: 300.0000 mg | ORAL_CAPSULE | Freq: Two times a day (BID) | ORAL | 5 refills | Status: DC

## 2017-08-15 MED ORDER — LEVOCETIRIZINE DIHYDROCHLORIDE 5 MG PO TABS: 5.0000 mg | ORAL_TABLET | Freq: Every evening | ORAL | 5 refills | Status: DC

## 2017-08-15 MED ORDER — LORATADINE 10 MG PO TABS: 10.0000 mg | ORAL_TABLET | Freq: Every morning | ORAL | 5 refills | Status: AC

## 2017-08-15 NOTE — Progress Notes (Signed)
Dear Dr. Willette Pa,  Thank you for referring Angel Sellers to the [facility] on 08/15/2017.   Below is a summation of this patient's evaluation and recommendations.  Thank you for your referral. I will keep you informed about this patient's response to treatment.   If you have any questions please do not hesitate to contact me.   Sincerely,  Jiles Prows, MD Allergy / Immunology [place]   ______________________________________________________________________    NEW PATIENT NOTE  Referring Provider: Starlyn Skeans Primary Provider: Lavone Orn, MD Date of office visit: 08/15/2017    Subjective:   Chief Complaint:  Angel Sellers (DOB: [birth year]) is a 57 y.o. female who presents to the clinic on 08/15/2017 with a chief complaint of Allergic Reaction .     HPI: Angel Sellers presents to this clinic in evaluation of a pruritic disorder.  Apparently since 2011 she has been having problems with skin itchiness and excoriation.  Recently she had a biopsy of her skin obtained after failing a course of systemic steroids for 20 days regarding control of her pruritic disorder and it was determined that she was having a drug reaction and she discontinued her Adderall about 1 month ago and her skin is better.  She did rechallenge herself with 2 doses of Adderall during the past month and developed a flare of her skin condition.  A flare of her skin condition is defined as extreme redness and intense itchiness.  Since she has stopped her Adderall she is now back to "baseline" which is continued itchiness with a some amount of redness involving her skin.  She really has no associated systemic or constitutional symptoms associated with this pruritic disorder.  She does not really have a strong atopic history other than some occasional congestion and sneezing and a distant history of asthma.   She did not have childhood atopic dermatitis.  She has been on a proton pump inhibitor for a prolonged period in time initially using omeprazole and most recently using pantoprazole.  She is using levo cetirizine in the daytime and she continues to use Provigil in the daytime to remain awake.  Apparently she has a diagnosis of fibromyalgia and is followed at [facility] for this condition.  Past Medical History:  Diagnosis Date  . Arthritis   . Asthma    mild obstructive poulmonary disease   . Autoimmune disease ([place])   . Cancer (HCC)    renal  . Chronic fatigue   . Fibromyalgia   . GERD (gastroesophageal reflux disease)   . Headache   . History of kidney cancer   . Hypertension   . Hypothyroidism   . Interstitial cystitis   . Neuromuscular disorder ([place])    nerve  . Norovirus 03/2017  . PONV (postoperative nausea and vomiting)    need scope patch  . POTS (postural orthostatic tachycardia syndrome)   . Recurrent upper respiratory infection (URI)    pneumonia  . Thyroid disease   . Urticaria     Past Surgical History:  Procedure Laterality Date  . ABDOMINAL HYSTERECTOMY  1999  . BONE BIOPSY Right 05/01/2013   Procedure: BIOPSY NAIL BED RIGHT THUMB, BIOPSY RIGHT GREAT TOE ;  Surgeon: Wynonia Sours, MD;  Location: [place];  Service: Orthopedics;  Laterality: Right;  . CESAREAN SECTION     2 c-cestions  . COLONOSCOPY    .  CYSTOSCOPY    . DISTAL INTERPHALANGEAL JOINT FUSION Left 02/26/2016   Procedure: LEFT 5TH TOE DISTAL INTERPHALANGEAL JOINT FUSION;  Surgeon: Newt Minion, MD;  Location: [place];  Service: Orthopedics;  Laterality: Left;  . INCISIONAL HERNIA REPAIR N/A 03/31/2016   Procedure: LAPAROSCOPIC ASSISTED REPAIR OF INCISIONAL HERNIA;  Surgeon: Greer Pickerel, MD;  [facility];  Service: General;  Laterality: N/A;  . INSERTION OF MESH N/A 03/31/2016   Procedure: INSERTION OF MESH;  Surgeon: Greer Pickerel, MD;  [facility];  Service: General;   Laterality: N/A;  . KIDNEY SURGERY  2011   rt partial nephrectomy-[facility]  . TONSILLECTOMY      Allergies as of 08/15/2017      Reactions   Other Rash   Derma bond, sunscreen, sun exposure, heat exposure, lotions, shampoos   Dilaudid [hydromorphone Hcl] Other (See Comments)   hypotension   Adderall [amphetamine-dextroamphetamine] Itching, Rash   Ciprofloxacin Itching, Rash, Other (See Comments)   Chest tightness/severe vertigo   Lactose Intolerance (gi) Other (See Comments)   GI upset   Meperidine Nausea And Vomiting   Morphine Hives, Rash      Medication List      albuterol 108 (90 Base) MCG/ACT inhaler Commonly known as:  PROVENTIL HFA;VENTOLIN HFA Inhale 2 puffs into the lungs every 6 (six) hours as needed for wheezing or shortness of breath.   DULoxetine 30 MG capsule Commonly known as:  CYMBALTA Take 90 mg by mouth daily.   DULoxetine 60 MG capsule Commonly known as:  CYMBALTA Take 90 mg by mouth daily.   hydrOXYzine 50 MG tablet Commonly known as:  ATARAX/VISTARIL Take by mouth.   levothyroxine 112 MCG tablet Commonly known as:  SYNTHROID, LEVOTHROID Take 125 mcg by mouth every other day.   modafinil 100 MG tablet Commonly known as:  PROVIGIL TAKE 1 TABLET BY MOUTH ONCE DAILY IN THE MORNING FOR 10 DAYS   mupirocin ointment 2 % Commonly known as:  BACTROBAN   olmesartan 5 MG tablet Commonly known as:  BENICAR Take 5 mg by mouth daily.   pantoprazole 40 MG tablet Commonly known as:  PROTONIX   promethazine 25 MG tablet Commonly known as:  PHENERGAN Take 25 mg by mouth every 6 (six) hours as needed (for nausea/vomiting with migraines.).   rizatriptan 10 MG tablet Commonly known as:  MAXALT Take 10 mg by mouth daily as needed for migraine. May repeat in 2 hours if needed   ROBAXIN 500 MG tablet Generic drug:  methocarbamol Take 500 mg by mouth 3 (three) times daily as needed (for muscle spasm/pain.).   tapentadol 50 MG tablet Commonly known as:   NUCYNTA Take 25 mg by mouth daily as needed (for pain due to exertion/falls).   topiramate 50 MG tablet Commonly known as:  TOPAMAX Take by mouth.   Vitamin D 2000 units tablet Take 2,000 Units by mouth daily.       Review of systems negative except as noted in HPI / PMHx or noted below:  Review of Systems  Constitutional: Negative.   HENT: Negative.   Eyes: Negative.   Respiratory: Negative.   Cardiovascular: Negative.   Gastrointestinal: Negative.   Genitourinary: Negative.   Musculoskeletal: Negative.   Skin: Negative.   Neurological: Negative.   Endo/Heme/Allergies: Negative.   Psychiatric/Behavioral: Negative.     Family History  Problem Relation Age of Onset  . Melanoma Mother   . Kidney cancer Sister   . Kidney cancer Brother  Social History   Socioeconomic History  . Marital status: Married    Spouse name: Not on file  . Number of children: Not on file  . Years of education: Not on file  . Highest education level: Not on file  Occupational History  . Not on file  Social Needs  . Financial resource strain: Not on file  . Food insecurity:    Worry: Not on file    Inability: Not on file  . Transportation needs:    Medical: Not on file    Non-medical: Not on file  Tobacco Use  . Smoking status: Never Smoker  . Smokeless tobacco: Never Used  Substance and Sexual Activity  . Alcohol use: No  . Drug use: No  . Sexual activity: Not on file  Lifestyle  . Physical activity:    Days per week: Not on file    Minutes per session: Not on file  . Stress: Not on file  Relationships  . Social connections:    Talks on phone: Not on file    Gets together: Not on file    Attends religious service: Not on file    Active member of club or organization: Not on file    Attends meetings of clubs or organizations: Not on file    Relationship status: Not on file  . Intimate partner violence:    Fear of current or ex partner: Not on file    Emotionally  abused: Not on file    Physically abused: Not on file    Forced sexual activity: Not on file  Other Topics Concern  . Not on file  Social History Narrative  . Not on file    Environmental and Social history  Lives in a townhouse with a dry environment, dogs located inside the household, no carpet in the bedroom, plastic on the bed, plastic on the pillow, no smokers located inside the household.  Objective:   Vitals:   08/15/17 0830  BP: 118/72  Pulse: (!) 110  Resp: 20  Temp: 98.2 F (36.8 C)  SpO2: 95%   Height: 5' 5.5" (166.4 cm) Weight: 193 lb (87.5 kg)  Physical Exam  HENT:  Head: Normocephalic.  Right Ear: Tympanic membrane, external ear and ear canal normal.  Left Ear: Tympanic membrane, external ear and ear canal normal.  Nose: Nose normal. No mucosal edema or rhinorrhea.  Mouth/Throat: Uvula is midline, oropharynx is clear and moist and mucous membranes are normal. No oropharyngeal exudate.  Eyes: Conjunctivae are normal.  Neck: Trachea normal. No tracheal tenderness present. No tracheal deviation present. No thyromegaly present.  Cardiovascular: Normal rate, regular rhythm, S1 normal, S2 normal and normal heart sounds.  No murmur heard. Pulmonary/Chest: Breath sounds normal. No stridor. No respiratory distress. She has no wheezes. She has no rales.  Musculoskeletal: She exhibits no edema.  Lymphadenopathy:       Head (right side): No tonsillar adenopathy present.       Head (left side): No tonsillar adenopathy present.    She has no cervical adenopathy.  Neurological: She is alert.  Skin: No rash (Minimal erythematous hue involving forearms.  Several excoriated Prue go nodularis-like lesions affecting extremity and back) noted. She is not diaphoretic. No erythema. Nails show no clubbing.    Diagnostics: Allergy skin tests were performed.  She did not demonstrate any hypersensitivity to his screening panel of foods or aeroallergens.  Results of a skin biopsy  obtained 04 Jul 2017 identified the following:  INTERFACE  DERMATITIS WITH NECROTIC KERATINOCYTES AND EOSINOPHILS, MOST CONSISTENT WITH A DRUG ERUPTION  Results of blood tests obtained 04 April 2017 identified creatinine 0.83 mg/DL, WBC 7.9, hemoglobin 14.5, platelet 210.   Assessment and Plan:    1. Inflammatory dermatosis   2. Pruritic disorder   3. Gastroesophageal reflux disease, esophagitis presence not specified     1.  Treat reflux with the following:   A.  Discontinue all proton pump inhibitors  B.  Ranitidine 300 mg twice a day  2.  Use the following antihistamines:   A.  Levo cetirizine 5 mg tablet in evening   B.  Loratadine 10 mg tablet in morning  C. Can continue benadryl in evening if needed  3.  Apply heavy body moisturizer applied after shower while wet daily  4.  Further evaluation and treatment?  Yes if unresponsive to treatment  5.  Return to clinic in 4 weeks or earlier if problem  Before we launch off on an extensive evaluation for immunological hyperreactivity I would like Angel Sellers to eliminate her proton pump inhibitor as this does appear to be 1 of the agents that she has been using for period in time that correlates with the onset of her pruritic disorder.  As well, I have asked her to perform topical therapy and we will have her use various antihistamines and see what kind of response we get over the course of the next 4 weeks.  Hopefully if this pruritic disorder is a drug reaction then during this timeframe she will improve significantly.  Jiles Prows, MD Allergy / Immunology [facility]

## 2017-08-15 NOTE — Patient Instructions (Addendum)
  1.  Treat reflux with the following:   A.  Discontinue all proton pump inhibitors  B.  Ranitidine 300 mg twice a day  2.  Use the following antihistamines:   A.  Levo cetirizine 5 mg tablet in evening   B.  Loratadine 10 mg tablet in morning  C. Can continue benadryl in evening if needed  3.  Use heavy body moisturizer applied after shower while wet daily  4.  Further evaluation and treatment?  Yes if unresponsive to treatment  5.  Return to clinic in 4 weeks or earlier if problem

## 2017-08-16 ENCOUNTER — Encounter: Payer: Self-pay | Admitting: Allergy and Immunology

## 2017-08-16 ENCOUNTER — Other Ambulatory Visit: Payer: Self-pay | Admitting: Physician Assistant

## 2017-08-16 DIAGNOSIS — L309 Dermatitis, unspecified: Secondary | ICD-10-CM | POA: Diagnosis not present

## 2017-08-16 DIAGNOSIS — D229 Melanocytic nevi, unspecified: Secondary | ICD-10-CM | POA: Diagnosis not present

## 2017-08-16 DIAGNOSIS — D485 Neoplasm of uncertain behavior of skin: Secondary | ICD-10-CM | POA: Diagnosis not present

## 2017-08-16 DIAGNOSIS — L570 Actinic keratosis: Secondary | ICD-10-CM | POA: Diagnosis not present

## 2017-09-20 DIAGNOSIS — R509 Fever, unspecified: Secondary | ICD-10-CM | POA: Diagnosis not present

## 2017-09-20 DIAGNOSIS — R102 Pelvic and perineal pain: Secondary | ICD-10-CM | POA: Diagnosis not present

## 2017-09-20 DIAGNOSIS — N390 Urinary tract infection, site not specified: Secondary | ICD-10-CM | POA: Diagnosis not present

## 2017-09-22 DIAGNOSIS — M797 Fibromyalgia: Secondary | ICD-10-CM | POA: Diagnosis not present

## 2017-09-22 DIAGNOSIS — N3010 Interstitial cystitis (chronic) without hematuria: Secondary | ICD-10-CM | POA: Diagnosis not present

## 2017-09-22 DIAGNOSIS — R1032 Left lower quadrant pain: Secondary | ICD-10-CM | POA: Diagnosis not present

## 2017-09-22 DIAGNOSIS — I10 Essential (primary) hypertension: Secondary | ICD-10-CM | POA: Diagnosis not present

## 2017-09-22 DIAGNOSIS — R102 Pelvic and perineal pain: Secondary | ICD-10-CM | POA: Diagnosis not present

## 2017-09-26 ENCOUNTER — Ambulatory Visit: Payer: 59 | Admitting: Allergy and Immunology

## 2017-10-12 DIAGNOSIS — Z85528 Personal history of other malignant neoplasm of kidney: Secondary | ICD-10-CM | POA: Diagnosis not present

## 2017-10-12 DIAGNOSIS — Z905 Acquired absence of kidney: Secondary | ICD-10-CM | POA: Diagnosis not present

## 2017-10-12 DIAGNOSIS — N3010 Interstitial cystitis (chronic) without hematuria: Secondary | ICD-10-CM | POA: Diagnosis not present

## 2017-10-12 DIAGNOSIS — M797 Fibromyalgia: Secondary | ICD-10-CM | POA: Diagnosis not present

## 2017-10-12 DIAGNOSIS — R5382 Chronic fatigue, unspecified: Secondary | ICD-10-CM | POA: Diagnosis not present

## 2017-10-12 DIAGNOSIS — K589 Irritable bowel syndrome without diarrhea: Secondary | ICD-10-CM | POA: Diagnosis not present

## 2017-10-17 ENCOUNTER — Ambulatory Visit
Admission: RE | Admit: 2017-10-17 | Discharge: 2017-10-17 | Disposition: A | Discharge status: Home / Self Care | Payer: 59 | Source: Ambulatory Visit | Attending: Nurse Practitioner | Admitting: Nurse Practitioner

## 2017-10-17 ENCOUNTER — Other Ambulatory Visit: Payer: Self-pay | Admitting: Nurse Practitioner

## 2017-10-17 DIAGNOSIS — M5442 Lumbago with sciatica, left side: Secondary | ICD-10-CM

## 2017-10-17 DIAGNOSIS — M797 Fibromyalgia: Secondary | ICD-10-CM | POA: Diagnosis not present

## 2017-10-17 DIAGNOSIS — M549 Dorsalgia, unspecified: Secondary | ICD-10-CM | POA: Diagnosis not present

## 2017-10-17 DIAGNOSIS — G8929 Other chronic pain: Secondary | ICD-10-CM | POA: Diagnosis not present

## 2017-11-14 ENCOUNTER — Telehealth (INDEPENDENT_AMBULATORY_CARE_PROVIDER_SITE_OTHER): Payer: Self-pay | Admitting: Orthopedic Surgery

## 2017-11-14 DIAGNOSIS — S92505A Nondisplaced unspecified fracture of left lesser toe(s), initial encounter for closed fracture: Secondary | ICD-10-CM | POA: Diagnosis not present

## 2017-11-14 NOTE — Telephone Encounter (Signed)
Called patient, will wait it out a couple of days and call if needed.

## 2017-11-14 NOTE — Telephone Encounter (Signed)
Dr. Sharol Given has previously put rod in left pinky toe, patient stubbed her toe and she is wondering if she could've refractured it or if she should wait it out and see if it stops hurting? Please advise  #  [phone]

## 2017-11-14 NOTE — Telephone Encounter (Signed)
Can you please call pt and make an appt. There is no way to be able to tell if there was an injury. We are happy to see her whenever she is available to come in.

## 2017-11-14 NOTE — Telephone Encounter (Signed)
Noted  

## 2017-11-15 ENCOUNTER — Telehealth (INDEPENDENT_AMBULATORY_CARE_PROVIDER_SITE_OTHER): Payer: Self-pay | Admitting: Orthopedic Surgery

## 2017-11-15 NOTE — Telephone Encounter (Signed)
I called pt to advise that we need to see her in the office. States that she went to [facility] last night and she did fx the toe. She declined treatment at the urgent care. I advised for her to get the films and to come in today. She states that she is unable to come and so appt was made for Thursday at 1:45

## 2017-11-15 NOTE — Telephone Encounter (Signed)
Patient called would like to know how she can send pictures to the office. I advise patients that I would ask the clinic not sure how that process works. Please cal to discuss patient care.

## 2017-11-16 ENCOUNTER — Encounter (INDEPENDENT_AMBULATORY_CARE_PROVIDER_SITE_OTHER): Payer: Self-pay | Admitting: Orthopedic Surgery

## 2017-11-16 ENCOUNTER — Ambulatory Visit (INDEPENDENT_AMBULATORY_CARE_PROVIDER_SITE_OTHER): Payer: 59 | Admitting: Orthopedic Surgery

## 2017-11-16 VITALS — Ht 65.5 in | Wt 193.0 lb

## 2017-11-16 DIAGNOSIS — S92502A Displaced unspecified fracture of left lesser toe(s), initial encounter for closed fracture: Secondary | ICD-10-CM | POA: Diagnosis not present

## 2017-11-16 NOTE — Progress Notes (Signed)
Office Visit Note   Patient: Angel Sellers           Date of Birth: [birth year]           MRN: [id number] Visit Date: 11/16/2017              Requested by: Lavone Orn, MD [address] [place], [place] [address] PCP: Lavone Orn, MD  Chief Complaint  Patient presents with  . Left Foot - Pain    5th Toe      HPI: Patient is a 57 year old woman who states that she had an acute fracture of the left little toe secondary to blunt trauma.  She did go to [facility] orthopedic specialist on [employer] radiographs were obtained which showed a nondisplaced intercondylar fracture of the base of the proximal phalanx left little toe patient states she cannot wear the postoperative shoe she cannot wear closed toed shoes.  Assessment & Plan: Visit Diagnoses:  1. Closed fracture of phalanx of left fifth toe, initial encounter     Plan: Recommended a stiff soled Trail running sneaker such as [place] to unload pressure.  Is on calcium and vitamin D3 supplements.  Will follow-up as needed.  Follow-Up Instructions: Return if symptoms worsen or fail to improve.   Ortho Exam  Patient is alert, oriented, no adenopathy, well-dressed, normal affect, normal respiratory effort. Examination patient has good pulses there is no redness no cellulitis there are no dystrophic changes no hypersensitivity to light touch.  Patient has some bruising in the fourth webspace consistent with a fracture.  Patient has radiographs were reviewed on her phone which showed a nondisplaced intra-articular fracture of the base of proximal phalanx left little toe.  The skin is intact no open fracture  Imaging: No results found. No images are attached to the encounter.  Labs: Lab Results  Component Value Date   REPTSTATUS 04/09/2017 FINAL 04/04/2017   GRAMSTAIN  03/16/2009    NO WBC SEEN NO SQUAMOUS EPITHELIAL CELLS SEEN NO ORGANISMS SEEN   CULT  04/04/2017    NO GROWTH 5 DAYS Performed at [facility], [place] [address], [place], [place] [address]    [place] 03/16/2009     Lab Results  Component Value Date   ALBUMIN 4.0 04/03/2017   ALBUMIN 4.2 09/19/2016   ALBUMIN 4.4 03/23/2016    Body mass index is 31.63 kg/m.  Orders:  No orders of the defined types were placed in this encounter.  No orders of the defined types were placed in this encounter.    Procedures: No procedures performed  Clinical Data: No additional findings.  ROS:  All other systems negative, except as noted in the HPI. Review of Systems  Objective: Vital Signs: Ht 5' 5.5" (1.664 m)   Wt 193 lb (87.5 kg)   BMI 31.63 kg/m   Specialty Comments:  No specialty comments available.  PMFS History: Patient Active Problem List   Diagnosis Date Noted  . GERD (gastroesophageal reflux disease) 04/04/2017  . Depression 04/04/2017  . Diarrhea 04/04/2017  . Sepsis ([place]) 04/04/2017  . Volume overload 09/21/2016  . Hypokalemia 09/21/2016  . Hypothyroidism 09/19/2016  . CAP (community acquired pneumonia) 09/19/2016  . Incisional hernia 03/31/2016  . Fusion of toes 02/26/2016  . Closed displaced fracture of middle phalanx of lesser toe of left foot   . History of reduction mammoplasty 04/16/2014  . Neck pain 08/09/2013  . Thumbnail injury 04/10/2013  . Chronic pain 05/28/2012  .  Fibromyalgia 05/28/2012  . Postural orthostatic tachycardia syndrome 05/28/2012  . Chronic fatigue syndrome 09/07/2011  . Chronic interstitial cystitis without hematuria 09/07/2011  . Chronic migraine without aura 09/07/2011  . Irritable bowel syndrome without diarrhea 09/07/2011  . 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 Migraine headache 03/18/2008  . Essential hypertension 03/18/2008  . COUGH 03/18/2008   Past Medical History:  Diagnosis Date  . Arthritis   . Asthma    mild obstructive poulmonary disease   . Autoimmune disease ([place])   . Cancer (HCC)    renal    . Chronic fatigue   . Fibromyalgia   . GERD (gastroesophageal reflux disease)   . Headache   . History of kidney cancer   . Hypertension   . Hypothyroidism   . Interstitial cystitis   . Neuromuscular disorder ([place])    nerve  . Norovirus 03/2017  . PONV (postoperative nausea and vomiting)    need scope patch  . POTS (postural orthostatic tachycardia syndrome)   . Recurrent upper respiratory infection (URI)    pneumonia  . Thyroid disease   . Urticaria     Family History  Problem Relation Age of Onset  . Melanoma Mother   . Kidney cancer Sister   . Kidney cancer Brother     Past Surgical History:  Procedure Laterality Date  . ABDOMINAL HYSTERECTOMY  1999  . BONE BIOPSY Right 05/01/2013   Procedure: BIOPSY NAIL BED RIGHT THUMB, BIOPSY RIGHT GREAT TOE ;  Surgeon: Wynonia Sours, MD;  Location: [place];  Service: Orthopedics;  Laterality: Right;  . CESAREAN SECTION     2 c-cestions  . COLONOSCOPY    . CYSTOSCOPY    . DISTAL INTERPHALANGEAL JOINT FUSION Left 02/26/2016   Procedure: LEFT 5TH TOE DISTAL INTERPHALANGEAL JOINT FUSION;  Surgeon: Newt Minion, MD;  Location: [place];  Service: Orthopedics;  Laterality: Left;  . INCISIONAL HERNIA REPAIR N/A 03/31/2016   Procedure: LAPAROSCOPIC ASSISTED REPAIR OF INCISIONAL HERNIA;  Surgeon: Greer Pickerel, MD;  [facility];  Service: General;  Laterality: N/A;  . INSERTION OF MESH N/A 03/31/2016   Procedure: INSERTION OF MESH;  Surgeon: Greer Pickerel, MD;  [facility];  Service: General;  Laterality: N/A;  . KIDNEY SURGERY  2011   rt partial nephrectomy-[facility]  . TONSILLECTOMY     Social History   Occupational History  . Not on file  Tobacco Use  . Smoking status: Never Smoker  . Smokeless tobacco: Never Used  Substance and Sexual Activity  . Alcohol use: No  . Drug use: No  . Sexual activity: Not on file

## 2017-12-27 ENCOUNTER — Encounter (INDEPENDENT_AMBULATORY_CARE_PROVIDER_SITE_OTHER): Payer: Self-pay | Admitting: Orthopedic Surgery

## 2017-12-27 ENCOUNTER — Ambulatory Visit (INDEPENDENT_AMBULATORY_CARE_PROVIDER_SITE_OTHER): Payer: 59

## 2017-12-27 ENCOUNTER — Ambulatory Visit (INDEPENDENT_AMBULATORY_CARE_PROVIDER_SITE_OTHER): Payer: 59 | Admitting: Orthopedic Surgery

## 2017-12-27 VITALS — Ht 65.0 in | Wt 193.0 lb

## 2017-12-27 DIAGNOSIS — M542 Cervicalgia: Secondary | ICD-10-CM

## 2017-12-27 MED ORDER — KETOROLAC TROMETHAMINE 10 MG PO TABS: 10.0000 mg | ORAL_TABLET | Freq: Three times a day (TID) | ORAL | 0 refills | Status: AC | PRN

## 2017-12-27 NOTE — Progress Notes (Signed)
Office Visit Note   Patient: Angel Sellers           Date of Birth: [birth year]           MRN: [id number] Visit Date: 12/27/2017              Requested by: Lavone Orn, MD [address] [place], [place] [address] PCP: Lavone Orn, MD  Chief Complaint  Patient presents with  . Neck - Pain      HPI:   Assessment & Plan: Visit Diagnoses:  1. Neck pain     Plan:   Follow-Up Instructions: No follow-ups on file.   Ortho Exam  Patient is alert, oriented, no adenopathy, well-dressed, normal affect, normal respiratory effort.   Imaging: No results found. No images are attached to the encounter.  Labs: Lab Results  Component Value Date   REPTSTATUS 04/09/2017 FINAL 04/04/2017   GRAMSTAIN  03/16/2009    NO WBC SEEN NO SQUAMOUS EPITHELIAL CELLS SEEN NO ORGANISMS SEEN   CULT  04/04/2017    NO GROWTH 5 DAYS Performed at [facility], [place] [address] [place], [place] [address]    [place] 03/16/2009     Lab Results  Component Value Date   ALBUMIN 4.0 04/03/2017   ALBUMIN 4.2 09/19/2016   ALBUMIN 4.4 03/23/2016    Body mass index is 32.12 kg/m.  Orders:  Orders Placed This Encounter  Procedures  . XR Cervical Spine 2 or 3 views   No orders of the defined types were placed in this encounter.    Procedures: No procedures performed  Clinical Data: No additional findings.  ROS:  All other systems negative, except as noted in the HPI. Review of Systems  Objective: Vital Signs: Ht 5\' 5"  (1.651 m)   Wt 193 lb (87.5 kg)   BMI 32.12 kg/m   Specialty Comments:  No specialty comments available.  PMFS History: Patient Active Problem List   Diagnosis Date Noted  . GERD (gastroesophageal reflux disease) 04/04/2017  . Depression 04/04/2017  . Diarrhea 04/04/2017  . Sepsis ([place]) 04/04/2017  . Volume overload 09/21/2016  . Hypokalemia 09/21/2016  . Hypothyroidism 09/19/2016  . CAP (community acquired  pneumonia) 09/19/2016  . Incisional hernia 03/31/2016  . Fusion of toes 02/26/2016  . Closed displaced fracture of middle phalanx of lesser toe of left foot   . History of reduction mammoplasty 04/16/2014  . Neck pain 08/09/2013  . Thumbnail injury 04/10/2013  . Chronic pain 05/28/2012  . Fibromyalgia 05/28/2012  . Postural orthostatic tachycardia syndrome 05/28/2012  . Chronic fatigue syndrome 09/07/2011  . Chronic interstitial cystitis without hematuria 09/07/2011  . Chronic migraine without aura 09/07/2011  . Irritable bowel syndrome without diarrhea 09/07/2011  . 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 Migraine headache 03/18/2008  . Essential hypertension 03/18/2008  . COUGH 03/18/2008   Past Medical History:  Diagnosis Date  . Arthritis   . Asthma    mild obstructive poulmonary disease   . Autoimmune disease ([place])   . Cancer (HCC)    renal  . Chronic fatigue   . Fibromyalgia   . GERD (gastroesophageal reflux disease)   . Headache   . History of kidney cancer   . Hypertension   . Hypothyroidism   . Interstitial cystitis   . Neuromuscular disorder ([place])    nerve  . Norovirus 03/2017  . PONV (postoperative nausea and vomiting)    need scope patch  .  POTS (postural orthostatic tachycardia syndrome)   . Recurrent upper respiratory infection (URI)    pneumonia  . Thyroid disease   . Urticaria     Family History  Problem Relation Age of Onset  . Melanoma Mother   . Kidney cancer Sister   . Kidney cancer Brother     Past Surgical History:  Procedure Laterality Date  . ABDOMINAL HYSTERECTOMY  1999  . BONE BIOPSY Right 05/01/2013   Procedure: BIOPSY NAIL BED RIGHT THUMB, BIOPSY RIGHT GREAT TOE ;  Surgeon: Wynonia Sours, MD;  Location: [place];  Service: Orthopedics;  Laterality: Right;  . CESAREAN SECTION     2 c-cestions  . COLONOSCOPY    . CYSTOSCOPY    . DISTAL INTERPHALANGEAL JOINT FUSION Left 02/26/2016   Procedure:  LEFT 5TH TOE DISTAL INTERPHALANGEAL JOINT FUSION;  Surgeon: Newt Minion, MD;  Location: [place];  Service: Orthopedics;  Laterality: Left;  . INCISIONAL HERNIA REPAIR N/A 03/31/2016   Procedure: LAPAROSCOPIC ASSISTED REPAIR OF INCISIONAL HERNIA;  Surgeon: Greer Pickerel, MD;  [facility];  Service: General;  Laterality: N/A;  . INSERTION OF MESH N/A 03/31/2016   Procedure: INSERTION OF MESH;  Surgeon: Greer Pickerel, MD;  [facility];  Service: General;  Laterality: N/A;  . KIDNEY SURGERY  2011   rt partial nephrectomy-[facility]  . TONSILLECTOMY     Social History   Occupational History  . Not on file  Tobacco Use  . Smoking status: Never Smoker  . Smokeless tobacco: Never Used  Substance and Sexual Activity  . Alcohol use: No  . Drug use: No  . Sexual activity: Not on file

## 2018-01-01 ENCOUNTER — Encounter (INDEPENDENT_AMBULATORY_CARE_PROVIDER_SITE_OTHER): Payer: Self-pay | Admitting: Orthopedic Surgery
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
## 2018-01-02 DIAGNOSIS — J209 Acute bronchitis, unspecified: Secondary | ICD-10-CM | POA: Diagnosis not present

## 2018-01-02 DIAGNOSIS — Z23 Encounter for immunization: Secondary | ICD-10-CM | POA: Diagnosis not present

## 2018-02-21 DIAGNOSIS — Z85528 Personal history of other malignant neoplasm of kidney: Secondary | ICD-10-CM | POA: Diagnosis not present

## 2018-02-21 DIAGNOSIS — R5382 Chronic fatigue, unspecified: Secondary | ICD-10-CM | POA: Diagnosis not present

## 2018-02-21 DIAGNOSIS — N3010 Interstitial cystitis (chronic) without hematuria: Secondary | ICD-10-CM | POA: Diagnosis not present

## 2018-02-21 DIAGNOSIS — K589 Irritable bowel syndrome without diarrhea: Secondary | ICD-10-CM | POA: Diagnosis not present

## 2018-02-21 DIAGNOSIS — Z905 Acquired absence of kidney: Secondary | ICD-10-CM | POA: Diagnosis not present

## 2018-02-21 DIAGNOSIS — M797 Fibromyalgia: Secondary | ICD-10-CM | POA: Diagnosis not present

## 2018-02-25 ENCOUNTER — Other Ambulatory Visit: Payer: Self-pay

## 2018-02-25 ENCOUNTER — Encounter (HOSPITAL_COMMUNITY): Payer: Self-pay | Admitting: Emergency Medicine

## 2018-02-25 ENCOUNTER — Emergency Department (HOSPITAL_COMMUNITY): Payer: 59

## 2018-02-25 ENCOUNTER — Emergency Department (HOSPITAL_COMMUNITY)
Admission: EM | Admit: 2018-02-25 | Discharge: 2018-02-25 | Disposition: A | Discharge status: Home / Self Care | Payer: 59 | Attending: Emergency Medicine | Admitting: Emergency Medicine

## 2018-02-25 DIAGNOSIS — R05 Cough: Secondary | ICD-10-CM | POA: Diagnosis not present

## 2018-02-25 DIAGNOSIS — E039 Hypothyroidism, unspecified: Secondary | ICD-10-CM | POA: Diagnosis not present

## 2018-02-25 DIAGNOSIS — I10 Essential (primary) hypertension: Secondary | ICD-10-CM | POA: Insufficient documentation

## 2018-02-25 DIAGNOSIS — Z85528 Personal history of other malignant neoplasm of kidney: Secondary | ICD-10-CM | POA: Insufficient documentation

## 2018-02-25 DIAGNOSIS — J40 Bronchitis, not specified as acute or chronic: Secondary | ICD-10-CM | POA: Insufficient documentation

## 2018-02-25 DIAGNOSIS — R0602 Shortness of breath: Secondary | ICD-10-CM | POA: Insufficient documentation

## 2018-02-25 LAB — COMPREHENSIVE METABOLIC PANEL
ALBUMIN: 4.3 g/dL (ref 3.5–5.0)
ALT: 30 U/L (ref 0–44)
AST: 29 U/L (ref 15–41)
Alkaline Phosphatase: 66 U/L (ref 38–126)
Anion gap: 11 (ref 5–15)
BUN: 16 mg/dL (ref 6–20)
CO2: 22 mmol/L (ref 22–32)
Calcium: 8.6 mg/dL — ABNORMAL LOW (ref 8.9–10.3)
Chloride: 105 mmol/L (ref 98–111)
Creatinine, Ser: 0.8 mg/dL (ref 0.44–1.00)
GFR calc Af Amer: >60 mL/min (ref >60)
Glucose, Bld: 152 mg/dL — ABNORMAL HIGH (ref 70–99)
Potassium: 3.6 mmol/L (ref 3.5–5.1)
Sodium: 138 mmol/L (ref 135–145)
Total Bilirubin: 0.4 mg/dL (ref 0.3–1.2)
Total Protein: 7.4 g/dL (ref 6.5–8.1)

## 2018-02-25 LAB — CBC WITH DIFFERENTIAL/PLATELET
Abs Immature Granulocytes: 0.02 10*3/uL (ref 0.00–0.07)
BASOS ABS: 0 10*3/uL (ref 0.0–0.1)
Basophils Relative: 1 %
Eosinophils Absolute: 0.3 10*3/uL (ref 0.0–0.5)
Eosinophils Relative: 6 %
HCT: 42.2 % (ref 36.0–46.0)
Hemoglobin: 13.5 g/dL (ref 12.0–15.0)
Immature Granulocytes: 0 %
Lymphocytes Relative: 46 %
Lymphs Abs: 2.2 10*3/uL (ref 0.7–4.0)
MCH: 28.1 pg (ref 26.0–34.0)
MCHC: 32 g/dL (ref 30.0–36.0)
MCV: 87.9 fL (ref 80.0–100.0)
Monocytes Absolute: 0.6 10*3/uL (ref 0.1–1.0)
Monocytes Relative: 13 %
Neutro Abs: 1.6 10*3/uL — ABNORMAL LOW (ref 1.7–7.7)
Neutrophils Relative %: 34 %
Platelets: 206 10*3/uL (ref 150–400)
RBC: 4.8 MIL/uL (ref 3.87–5.11)
RDW: 13.8 % (ref 11.5–15.5)
WBC: 4.8 10*3/uL (ref 4.0–10.5)
nRBC: 0 % (ref 0.0–0.2)

## 2018-02-25 LAB — TROPONIN I: Troponin I: <0.03 ng/mL (ref <0.03)

## 2018-02-25 LAB — BRAIN NATRIURETIC PEPTIDE: B Natriuretic Peptide: 7.2 pg/mL (ref 0.0–100.0)

## 2018-02-25 MED ORDER — AZITHROMYCIN 250 MG PO TABS: 250.0000 mg | ORAL_TABLET | Freq: Every day | ORAL | 0 refills | Status: DC

## 2018-02-25 MED ORDER — PREDNISONE 20 MG PO TABS: ORAL_TABLET | ORAL | 0 refills | Status: DC

## 2018-02-25 MED ORDER — ALBUTEROL SULFATE (2.5 MG/3ML) 0.083% IN NEBU
5.0000 mg | INHALATION_SOLUTION | Freq: Once | RESPIRATORY_TRACT | Status: AC
  Administered 2018-02-25: 5 mg via RESPIRATORY_TRACT
  Filled 2018-02-25: qty 6

## 2018-02-25 MED ORDER — IPRATROPIUM-ALBUTEROL 0.5-2.5 (3) MG/3ML IN SOLN: 3.0000 mL | Freq: Once | RESPIRATORY_TRACT | Status: DC

## 2018-02-25 MED ORDER — PREDNISONE 20 MG PO TABS
60.0000 mg | ORAL_TABLET | Freq: Once | ORAL | Status: AC
  Administered 2018-02-25: 60 mg via ORAL
  Filled 2018-02-25: qty 3

## 2018-02-25 MED ORDER — AZITHROMYCIN 250 MG PO TABS
500.0000 mg | ORAL_TABLET | Freq: Once | ORAL | Status: AC
  Administered 2018-02-25: 500 mg via ORAL
  Filled 2018-02-25: qty 2

## 2018-02-25 MED ORDER — IPRATROPIUM-ALBUTEROL 0.5-2.5 (3) MG/3ML IN SOLN
3.0000 mL | Freq: Once | RESPIRATORY_TRACT | Status: AC
  Administered 2018-02-25: 3 mL via RESPIRATORY_TRACT
  Filled 2018-02-25: qty 3

## 2018-02-25 NOTE — ED Notes (Signed)
EKG will need to be repeated once pt is able to lay flat, Per EDP [place]

## 2018-02-25 NOTE — ED Provider Notes (Signed)
Emergency Department Provider Note   I have reviewed the triage vital signs and the nursing notes.   HISTORY  Chief Complaint Shortness of Breath   HPI Angel Sellers is a 58 y.o. female with multiple medical problems documented below the presents to the emergency department today secondary to shortness of breath.  Patient states that she has been feeling this way for approximately 10 to 11 days it is been progressively worsening and it got a lot worse tonight.  She states that she feels a gets worse when she lays flat and she is felt like this before when she had pneumonia but has no history of heart failure or heart attacks that she knows of.  No lower extremity edema.  No dyspnea or pain with exertion.  No fevers but does have a productive cough. No other associated or modifying symptoms.    Past Medical History:  Diagnosis Date  . Arthritis   . Asthma    mild obstructive poulmonary disease   . Autoimmune disease ([place])   . Cancer (HCC)    renal  . Chronic fatigue   . Fibromyalgia   . GERD (gastroesophageal reflux disease)   . Headache   . History of kidney cancer   . Hypertension   . Hypothyroidism   . Interstitial cystitis   . Neuromuscular disorder ([place])    nerve  . Norovirus 03/2017  . PONV (postoperative nausea and vomiting)    need scope patch  . POTS (postural orthostatic tachycardia syndrome)   . Recurrent upper respiratory infection (URI)    pneumonia  . Thyroid disease   . Urticaria     Patient Active Problem List   Diagnosis Date Noted  . GERD (gastroesophageal reflux disease) 04/04/2017  . Depression 04/04/2017  . Diarrhea 04/04/2017  . Sepsis ([place]) 04/04/2017  . Volume overload 09/21/2016  . Hypokalemia 09/21/2016  . Hypothyroidism 09/19/2016  . CAP (community acquired pneumonia) 09/19/2016  . Incisional hernia 03/31/2016  . Fusion of toes 02/26/2016  . Closed displaced fracture of middle phalanx of lesser toe of left foot   . History of  reduction mammoplasty 04/16/2014  . Neck pain 08/09/2013  . Thumbnail injury 04/10/2013  . Chronic pain 05/28/2012  . Fibromyalgia 05/28/2012  . Postural orthostatic tachycardia syndrome 05/28/2012  . Chronic fatigue syndrome 09/07/2011  . Chronic interstitial cystitis without hematuria 09/07/2011  . Chronic migraine without aura 09/07/2011  . Irritable bowel syndrome without diarrhea 09/07/2011  . 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 Migraine headache 03/18/2008  . Essential hypertension 03/18/2008  . COUGH 03/18/2008    Past Surgical History:  Procedure Laterality Date  . ABDOMINAL HYSTERECTOMY  1999  . BONE BIOPSY Right 05/01/2013   Procedure: BIOPSY NAIL BED RIGHT THUMB, BIOPSY RIGHT GREAT TOE ;  Surgeon: Wynonia Sours, MD;  Location: [facility];  Service: Orthopedics;  Laterality: Right;  . CESAREAN SECTION     2 c-cestions  . COLONOSCOPY    . CYSTOSCOPY    . DISTAL INTERPHALANGEAL JOINT FUSION Left 02/26/2016   Procedure: LEFT 5TH TOE DISTAL INTERPHALANGEAL JOINT FUSION;  Surgeon: Newt Minion, MD;  Location: [facility];  Service: Orthopedics;  Laterality: Left;  . INCISIONAL HERNIA REPAIR N/A 03/31/2016   Procedure: LAPAROSCOPIC ASSISTED REPAIR OF INCISIONAL HERNIA;  Surgeon: Greer Pickerel, MD;  Location[facility];  Service: General;  Laterality: N/A;  . INSERTION OF MESH N/A 03/31/2016   Procedure: INSERTION OF MESH;  Surgeon:  Greer Pickerel, MD;  Location: [facility];  Service: General;  Laterality: N/A;  . KIDNEY SURGERY  2011   rt partial nephrectomy-[facility]  . TONSILLECTOMY      Current Outpatient Rx  . Order #: [id number] Class: Historical Med  . Order #: [id number] Class: Print  . Order #: [id number] Class: Historical Med  . Order #: [id number] Class: Historical Med  . Order #: [id number] Class: Historical Med  . Order #: [id number] Class: Historical Med  . Order #: [id number] Class: Normal  . Order #: [id number] Class: Normal  . Order #: [id number] Class:  Historical Med  . Order #: [id number] Class: Normal  . Order #: [id number] Class: Historical Med  . Order #: [id number] Class: Historical Med  . Order #: [id number] Class: Historical Med  . Order #: [id number] Class: Historical Med  . Order #: [id number] Class: Historical Med  . Order #: [id number] Class: Print  . Order #: [id number] Class: Historical Med  . Order #: [id number] Class: Normal  . Order #: [id number] Class: Historical Med  . Order #: [id number] Class: Historical Med  . Order #: [id number] Class: Historical Med    Allergies Other; Dilaudid [hydromorphone hcl]; Hydromorphone; Adderall [amphetamine-dextroamphetamine]; Ciprofloxacin; Lactose; Lactose intolerance (gi); Meperidine; and Morphine  Family History  Problem Relation Age of Onset  . Melanoma Mother   . Kidney cancer Sister   . Kidney cancer Brother     Social History Social History   Tobacco Use  . Smoking status: Never Smoker  . Smokeless tobacco: Never Used  Substance Use Topics  . Alcohol use: No  . Drug use: No    Review of Systems  All other systems negative except as documented in the HPI. All pertinent positives and negatives as reviewed in the HPI. ____________________________________________   PHYSICAL EXAM:  VITAL SIGNS: ED Triage Vitals  Enc Vitals Group     BP 02/25/18 0419 (!) 151/85     Pulse Rate 02/25/18 0419 (!) 121     Resp 02/25/18 0419 18     Temp 02/25/18 0419 98.9 F (37.2 C)     Temp Source 02/25/18 0419 Oral     SpO2 02/25/18 0419 98 %     Weight 02/25/18 0419 188 lb (85.3 kg)     Height 02/25/18 0419 5\' 5"  (1.651 m)    Constitutional: Alert and oriented. Well appearing and in no acute distress. Eyes: Conjunctivae are normal. PERRL. EOMI. Head: Atraumatic. Nose: No congestion/rhinnorhea. Mouth/Throat: Mucous membranes are moist.  Oropharynx non-erythematous. Neck: No stridor.  No meningeal signs.   Cardiovascular: tachycardic rate, regular rhythm. Good peripheral circulation. Grossly  normal heart sounds.   Respiratory: tachypneic respiratory effort.  No retractions. Lungs diminished bilaterally. Gastrointestinal: Soft and nontender. No distention.  Musculoskeletal: No lower extremity tenderness nor edema. No gross deformities of extremities. Neurologic:  Normal speech and language. No gross focal neurologic deficits are appreciated.  Skin:  Skin is warm, dry and intact. No rash noted.  ____________________________________________   LABS (all labs ordered are listed, but only abnormal results are displayed)  Labs Reviewed  CBC WITH DIFFERENTIAL/PLATELET - Abnormal; Notable for the following components:      Result Value   Neutro Abs 1.6 (*)    All other components within normal limits  COMPREHENSIVE METABOLIC PANEL - Abnormal; Notable for the following components:   Glucose, Bld 152 (*)    Calcium 8.6 (*)    All other components within normal limits  TROPONIN I  BRAIN NATRIURETIC PEPTIDE   ____________________________________________  EKG   EKG Interpretation  Date/Time:    Ventricular Rate:  PR Interval:    QRS Duration:   QT Interval:    QTC Calculation:   R Axis:     Text Interpretation:         ____________________________________________  RADIOLOGY  Dg Chest 2 View  Result Date: 02/25/2018 CLINICAL DATA:  Patient with cough for 1 week. EXAM: CHEST - 2 VIEW COMPARISON:  Chest radiograph 04/04/2017 FINDINGS: Monitoring leads overlie the patient. Normal cardiac and mediastinal contours. No consolidative pulmonary opacities. No pleural effusion or pneumothorax. Regional skeleton is unremarkable. IMPRESSION: No active cardiopulmonary disease. Electronically Signed   By: Lovey Newcomer M.D.   On: 02/25/2018 04:43    ____________________________________________   PROCEDURES  Procedure(s) performed:   Procedures   ____________________________________________   INITIAL IMPRESSION / ASSESSMENT AND PLAN / ED COURSE  Here with likely  bronchitis and has been sick for couple weeks we will start antibiotics.  After 1 breathing treatment her lungs improved significantly and started wheezing quite a bit so gave another breathing treatment she will continue albuterol at home.  Steroids as well.  PCP follow-up.  No respiratory distress.  Her oxygen saturation would get down around 90% when she was sleeping but when she woke up and was breathing normally should be in the 95 to 96% range. Stable for dc at this time.      Pertinent labs & imaging results that were available during my care of the patient were reviewed by me and considered in my medical decision making (see chart for details).  ____________________________________________  FINAL CLINICAL IMPRESSION(S) / ED DIAGNOSES  Final diagnoses:  SOB (shortness of breath)  Bronchitis     MEDICATIONS GIVEN DURING THIS VISIT:  Medications  ipratropium-albuterol (DUONEB) 0.5-2.5 (3) MG/3ML nebulizer solution 3 mL (0 mLs Nebulization Hold 02/25/18 0505)  albuterol (PROVENTIL) (2.5 MG/3ML) 0.083% nebulizer solution 5 mg (5 mg Nebulization Given 02/25/18 0444)  predniSONE (DELTASONE) tablet 60 mg (60 mg Oral Given 02/25/18 0454)  azithromycin (ZITHROMAX) tablet 500 mg (500 mg Oral Given 02/25/18 0454)  ipratropium-albuterol (DUONEB) 0.5-2.5 (3) MG/3ML nebulizer solution 3 mL (3 mLs Nebulization Given 02/25/18 0622)     NEW OUTPATIENT MEDICATIONS STARTED DURING THIS VISIT:  Discharge Medication List as of 02/25/2018  6:23 AM    START taking these medications   Details  azithromycin (ZITHROMAX) 250 MG tablet Take 1 tablet (250 mg total) by mouth daily. Take 1 every day until finished., Starting Sun 02/25/2018, Print    predniSONE (DELTASONE) 20 MG tablet 2 tabs po daily x 4 days, Print        Note:  This note was prepared with assistance of Dragon voice recognition software. Occasional wrong-word or sound-a-like substitutions may have occurred due to the inherent limitations  of voice recognition software.   Marios Gaiser, Corene Cornea, MD 02/25/18 [id number]

## 2018-02-25 NOTE — ED Triage Notes (Signed)
Patient is complaining of sob and a cough that she has had for a week. Patient states that she feels like she is drowning when she lays down. Patient has a hx of fibromyalgia. Patient is complaining of back and chest wall pain from coughing.

## 2018-02-28 DIAGNOSIS — J4551 Severe persistent asthma with (acute) exacerbation: Secondary | ICD-10-CM | POA: Diagnosis not present

## 2018-04-24 IMAGING — US US ABDOMEN LIMITED
1 series · 14 of 25 positions shown · non-contrast
Comparison: CT scan abdomen and pelvis dated 09/29/2014

CLINICAL DATA: Right upper quadrant pain.

EXAM:
ULTRASOUND ABDOMEN LIMITED RIGHT UPPER QUADRANT

[Series 1: us abdomen limited · 0.26mm/px · 14 of 43 slices shown]
[im 1/43]
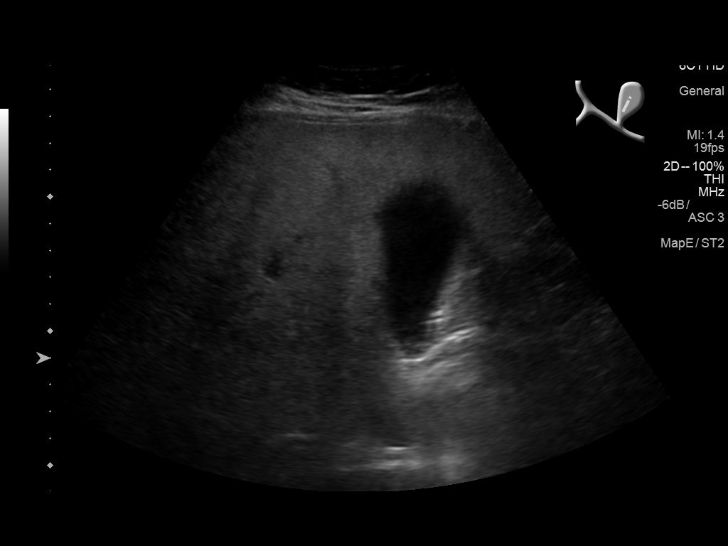
[im 4/43]
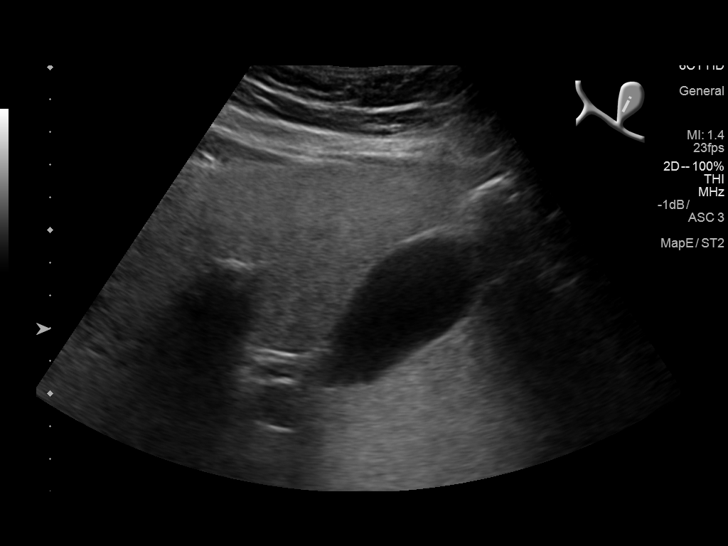
[im 8/43]
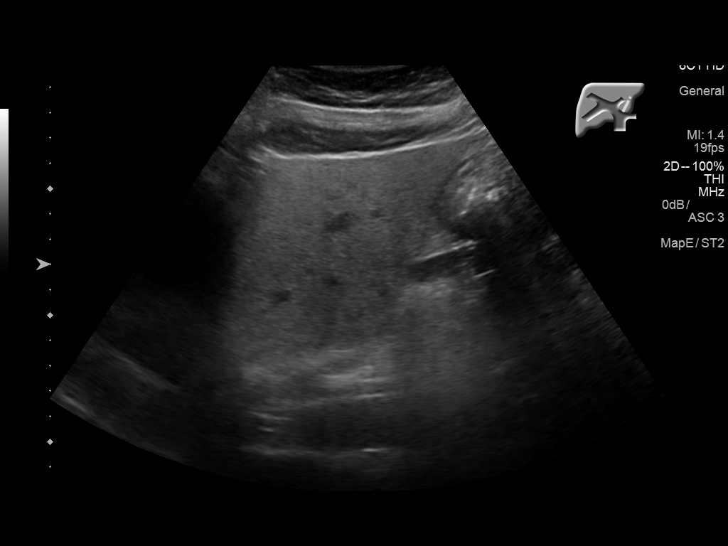
[im 11/43]
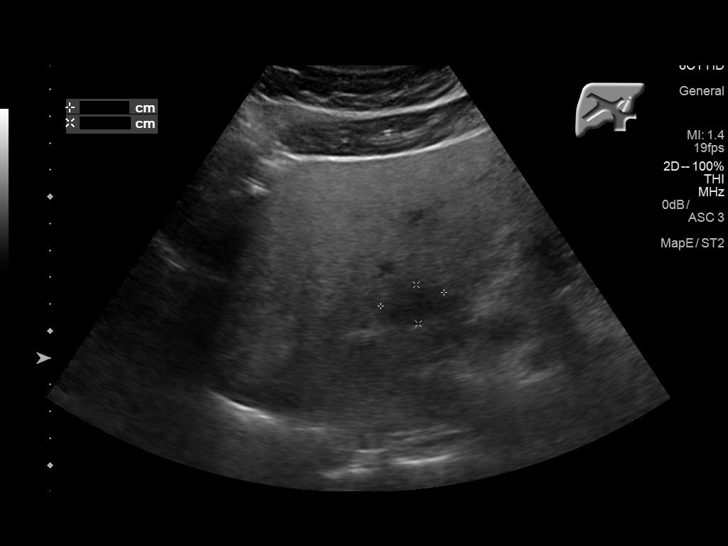
[im 15/43]
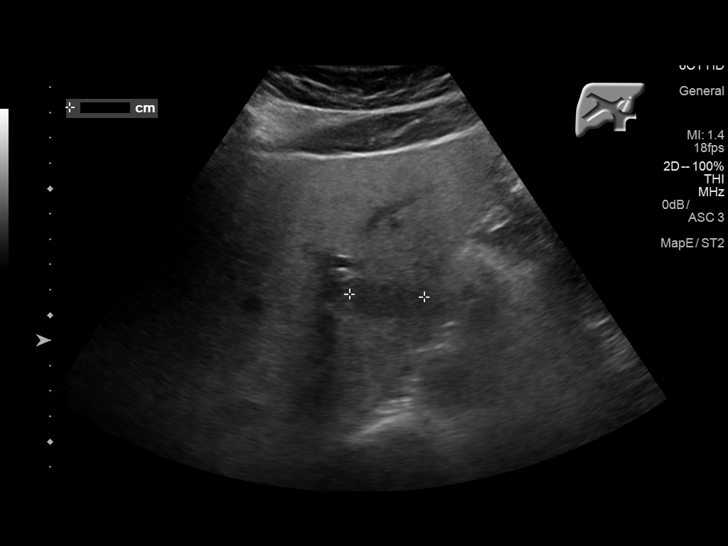
[im 16/43]
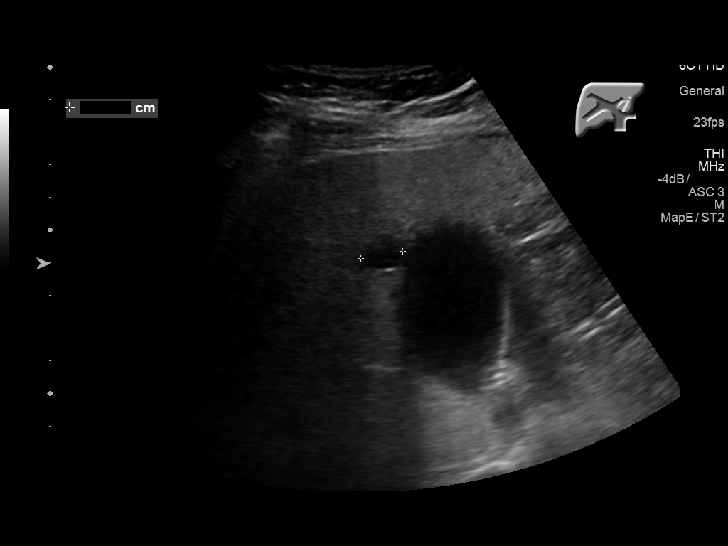
[im 20/43]
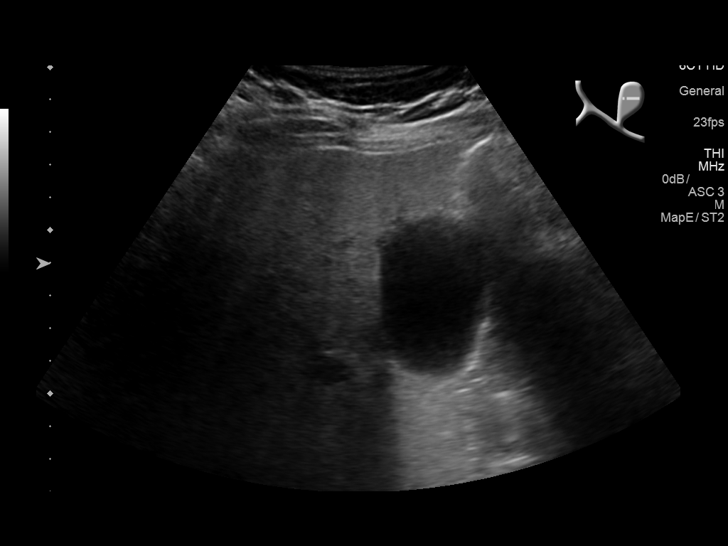
[im 23/43]
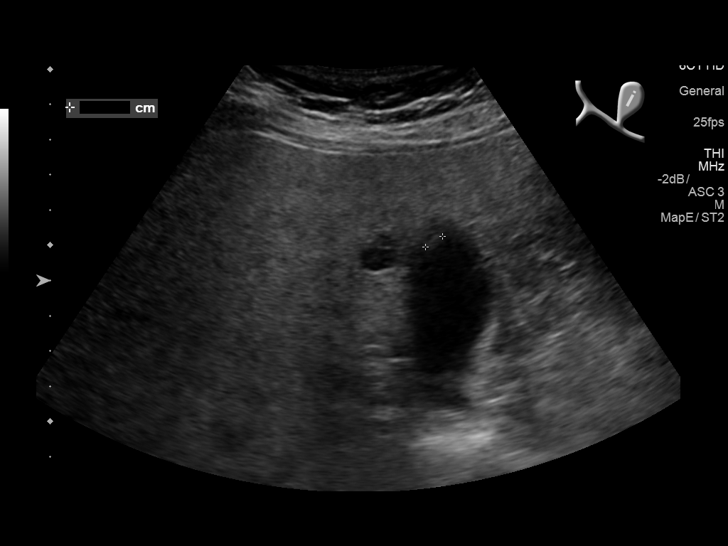
[im 27/43]
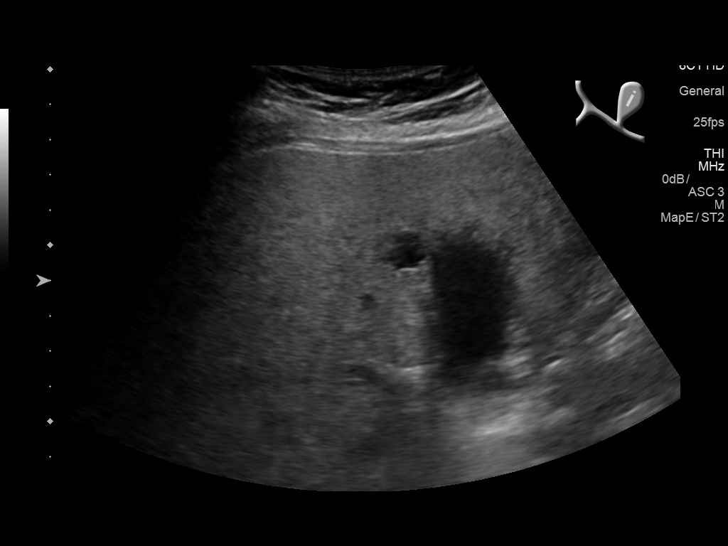
[im 29/43]
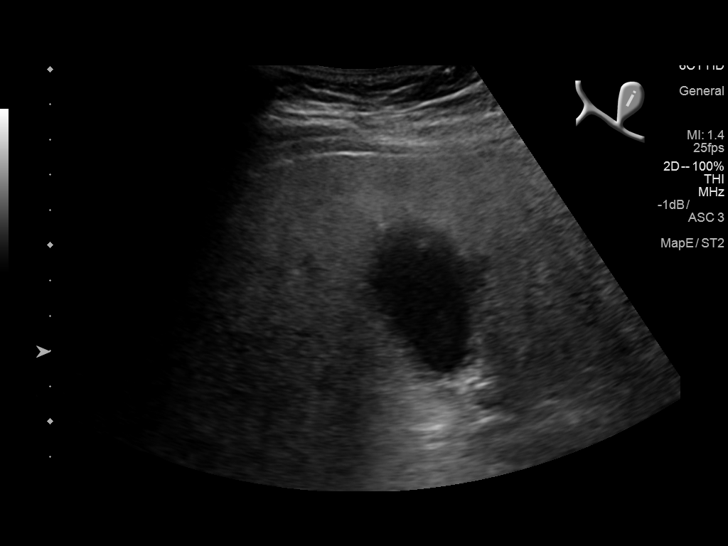
[im 32/43]
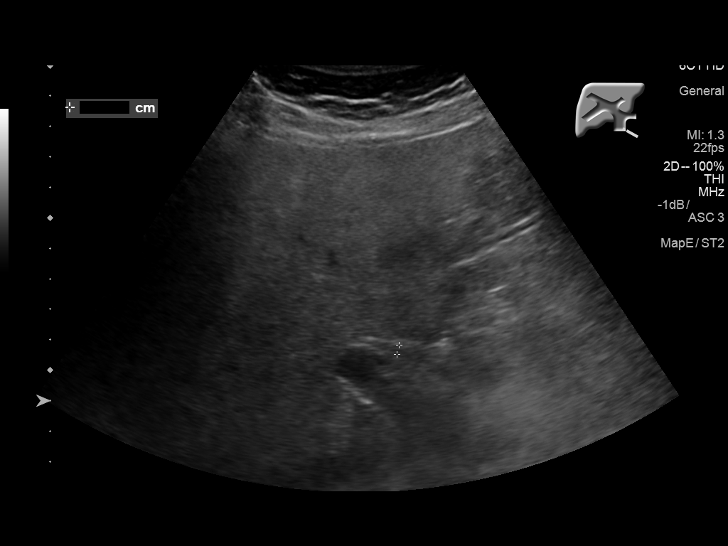
[im 36/43]
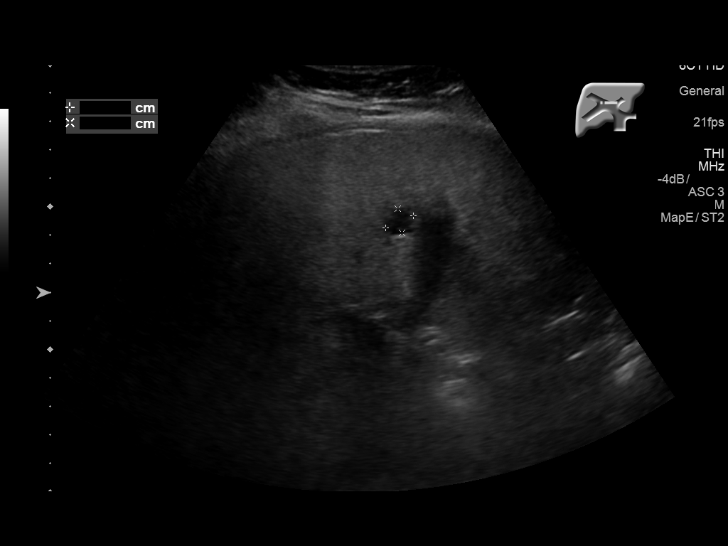
[im 39/43]
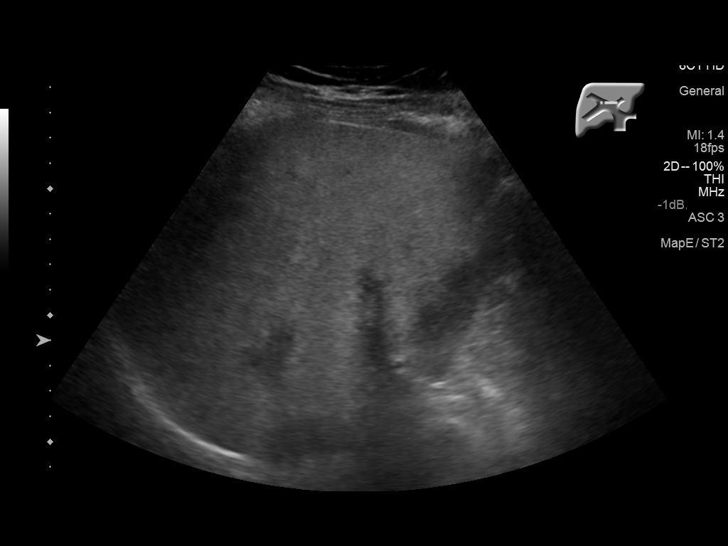
[im 43/43]
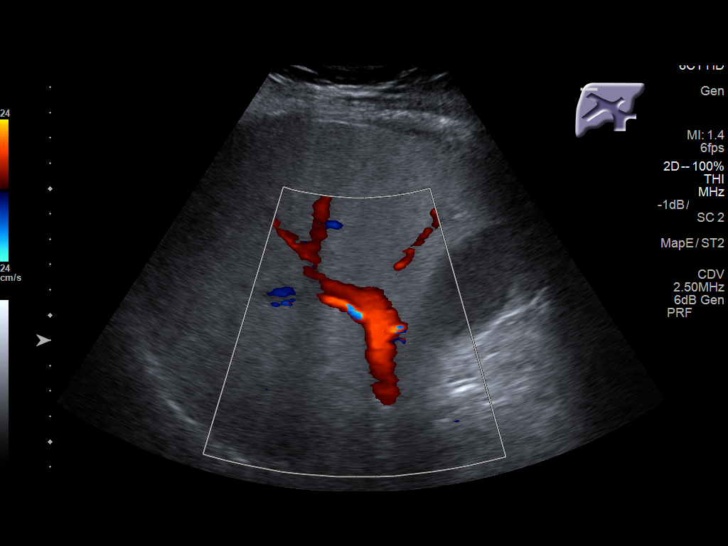

[14 of 25 positions shown; findings below may reference images not displayed]

FINDINGS: Gallbladder:

No gallstones or wall thickening visualized. No sonographic Murphy
sign noted by sonographer.

Common bile duct:

Diameter: 3.1 mm, normal.

Liver:

1.3 cm cyst in right lobe of the liver adjacent to the gallbladder.
Diffuse increased echogenicity consistent with hepatic steatosis as
demonstrated on the prior CT scan. Focal fatty sparing of the liver
in the posterior aspect of the left lobe.
IMPRESSION: 1. Hepatic steatosis.
2. No acute abnormalities.

## 2018-05-21 ENCOUNTER — Telehealth (INDEPENDENT_AMBULATORY_CARE_PROVIDER_SITE_OTHER): Payer: Self-pay

## 2018-05-21 NOTE — Telephone Encounter (Signed)
Called and sw pt. She answered NO to all COVID-19 questions EXCEPT she does have a cough and states that this is allergies and asthma and that she has a call in to her PCP for direction on medication and if he tells her she should not go out she will cancel appt.

## 2018-05-22 ENCOUNTER — Ambulatory Visit (INDEPENDENT_AMBULATORY_CARE_PROVIDER_SITE_OTHER): Payer: 59 | Admitting: Orthopedic Surgery

## 2018-05-22 ENCOUNTER — Other Ambulatory Visit: Payer: Self-pay

## 2018-05-22 ENCOUNTER — Encounter (INDEPENDENT_AMBULATORY_CARE_PROVIDER_SITE_OTHER): Payer: Self-pay | Admitting: Orthopedic Surgery

## 2018-05-22 VITALS — Ht 65.0 in | Wt 188.0 lb

## 2018-05-22 DIAGNOSIS — L608 Other nail disorders: Secondary | ICD-10-CM

## 2018-05-22 DIAGNOSIS — J4521 Mild intermittent asthma with (acute) exacerbation: Secondary | ICD-10-CM | POA: Diagnosis not present

## 2018-05-22 NOTE — Progress Notes (Addendum)
Office Visit Note   Patient: Angel Sellers           Date of Birth: Aug 15, 1960           MRN: [id number] Visit Date: 05/22/2018              Requested by: Lavone Orn, MD [address] [place], [place] [address] PCP: Lavone Orn, MD  Chief Complaint  Patient presents with  . Right Foot - Skin Discoloration      HPI: Patient is a 58 year old woman who was seen for a black stripe right great toenail.  Patient states that the dark area has been getting darker recently.  She states she had a fingernail sent for melanoma pathology exam and this was negative for melanoma.  Patient states she has had biopsies in the past that were positive for increased melanin.  Patient is concerned for a melanoma of her nail.  Assessment & Plan: Visit Diagnoses:  1. Nail deformity     Plan: Nail was excised this was sent to pathology.  The nailbed had no discoloration no signs of any discoloration or abnormality of the nailbed.  Patient was given instructions for wound care and a postoperative shoe.  Follow-Up Instructions: Return in about 2 weeks (around 06/05/2018).   Ortho Exam  Patient is alert, oriented, no adenopathy, well-dressed, normal affect, normal respiratory effort. Examination of the right foot patient's foot is neurovascular intact good pulses.  She has a dark stripe longitudinally of the great toenail that is approximately 3 mm in width and covers the entire nail.  The discoloration is in the nail.  She has a similar discoloration in her thumb nail as well.  This was biopsied previously as well the discoloration of the nail has reoccurred.  After informed consent and sterile prepping patient underwent a digital block with 10 cc of 1% lidocaine plain.  After adequate anesthesia were obtained the nail was removed completely.  The discoloration was in the nail it was not in the nailbed the nail bed is unaffected.  A sterile dressing was applied patient was placed in a  postoperative shoe.  She tolerated this well.  Specimen was sent to pathology.  The dark line does not extend into the cuticle does not have the clinical appearance of a melanoma.  Imaging: No results found.   Labs: Lab Results  Component Value Date   REPTSTATUS 04/09/2017 FINAL 04/04/2017   GRAMSTAIN  03/16/2009    NO WBC SEEN NO SQUAMOUS EPITHELIAL CELLS SEEN NO ORGANISMS SEEN   CULT  04/04/2017    NO GROWTH 5 DAYS Performed at [facility], [place] [address] [place], [place] [address]    [place] 03/16/2009     Lab Results  Component Value Date   ALBUMIN 4.3 02/25/2018   ALBUMIN 4.0 04/03/2017   ALBUMIN 4.2 09/19/2016    Body mass index is 31.28 kg/m.  Orders:  No orders of the defined types were placed in this encounter.  No orders of the defined types were placed in this encounter.    Procedures: No procedures performed  Clinical Data: No additional findings.  ROS:  All other systems negative, except as noted in the HPI. Review of Systems  Objective: Vital Signs: Ht 5\' 5"  (1.651 m)   Wt 188 lb (85.3 kg)   BMI 31.28 kg/m   Specialty Comments:  No specialty comments available.  PMFS History: Patient Active Problem List   Diagnosis Date  Noted  . GERD (gastroesophageal reflux disease) 04/04/2017  . Depression 04/04/2017  . Diarrhea 04/04/2017  . Sepsis ([place]) 04/04/2017  . Volume overload 09/21/2016  . Hypokalemia 09/21/2016  . Hypothyroidism 09/19/2016  . CAP (community acquired pneumonia) 09/19/2016  . Incisional hernia 03/31/2016  . Fusion of toes 02/26/2016  . Closed displaced fracture of middle phalanx of lesser toe of left foot   . History of reduction mammoplasty 04/16/2014  . Neck pain 08/09/2013  . Thumbnail injury 04/10/2013  . Chronic pain 05/28/2012  . Fibromyalgia 05/28/2012  . Postural orthostatic tachycardia syndrome 05/28/2012  . Chronic fatigue syndrome 09/07/2011  . Chronic interstitial  cystitis without hematuria 09/07/2011  . Chronic migraine without aura 09/07/2011  . Irritable bowel syndrome without diarrhea 09/07/2011  . 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 Migraine headache 03/18/2008  . Essential hypertension 03/18/2008  . COUGH 03/18/2008   Past Medical History:  Diagnosis Date  . Arthritis   . Asthma    mild obstructive poulmonary disease   . Autoimmune disease ([place])   . Cancer (HCC)    renal  . Chronic fatigue   . Fibromyalgia   . GERD (gastroesophageal reflux disease)   . Headache   . History of kidney cancer   . Hypertension   . Hypothyroidism   . Interstitial cystitis   . Neuromuscular disorder ([place])    nerve  . Norovirus 03/2017  . PONV (postoperative nausea and vomiting)    need scope patch  . POTS (postural orthostatic tachycardia syndrome)   . Recurrent upper respiratory infection (URI)    pneumonia  . Thyroid disease   . Urticaria     Family History  Problem Relation Age of Onset  . Melanoma Mother   . Kidney cancer Sister   . Kidney cancer Brother     Past Surgical History:  Procedure Laterality Date  . ABDOMINAL HYSTERECTOMY  1999  . BONE BIOPSY Right 05/01/2013   Procedure: BIOPSY NAIL BED RIGHT THUMB, BIOPSY RIGHT GREAT TOE ;  Surgeon: Wynonia Sours, MD;  Location: [facility];  Service: Orthopedics;  Laterality: Right;  . CESAREAN SECTION     2 c-cestions  . COLONOSCOPY    . CYSTOSCOPY    . DISTAL INTERPHALANGEAL JOINT FUSION Left 02/26/2016   Procedure: LEFT 5TH TOE DISTAL INTERPHALANGEAL JOINT FUSION;  Surgeon: Newt Minion, MD;  Location: [facility];  Service: Orthopedics;  Laterality: Left;  . INCISIONAL HERNIA REPAIR N/A 03/31/2016   Procedure: LAPAROSCOPIC ASSISTED REPAIR OF INCISIONAL HERNIA;  Surgeon: Greer Pickerel, MD;  [facility];  Service: General;  Laterality: N/A;  . INSERTION OF MESH N/A 03/31/2016   Procedure: INSERTION OF MESH;  Surgeon: Greer Pickerel, MD;  [facility];   Service: General;  Laterality: N/A;  . KIDNEY SURGERY  2011   rt partial nephrectomy-[facility]  . TONSILLECTOMY     Social History   Occupational History  . Not on file  Tobacco Use  . Smoking status: Never Smoker  . Smokeless tobacco: Never Used  Substance and Sexual Activity  . Alcohol use: No  . Drug use: No  . Sexual activity: Not on file

## 2018-05-22 NOTE — Addendum Note (Signed)
Addended byLaurann Montana on: 05/22/2018 10:51 AM   Modules accepted: Orders

## 2018-05-23 ENCOUNTER — Telehealth (INDEPENDENT_AMBULATORY_CARE_PROVIDER_SITE_OTHER): Payer: Self-pay

## 2018-05-23 NOTE — Telephone Encounter (Signed)
Called to confirm what she was supposed to do with the specimen that came over from yesterdays visit. Advised it was a right GT nail removal and sent for pathology hx of melanoma/ increased melanin

## 2018-05-25 LAB — PATHOLOGY

## 2018-05-29 ENCOUNTER — Telehealth (INDEPENDENT_AMBULATORY_CARE_PROVIDER_SITE_OTHER): Payer: Self-pay

## 2018-05-29 NOTE — Telephone Encounter (Signed)
I have put in a call to manager at [facility] in regards to the test we ordered being read as a fungus instead of what was ordered for melanoma.  I am waiting for a call back Order # [id number]

## 2018-05-31 ENCOUNTER — Telehealth (INDEPENDENT_AMBULATORY_CARE_PROVIDER_SITE_OTHER): Payer: Self-pay | Admitting: Orthopedic Surgery

## 2018-05-31 NOTE — Telephone Encounter (Signed)
Patient called to get the results of her biopsy and also to see why her appointment on April 21st needs to be rescheduled.  CB#[phone]. Thank you.

## 2018-06-01 NOTE — Telephone Encounter (Signed)
Call patient

## 2018-06-01 NOTE — Telephone Encounter (Signed)
Please advise on results I have tried to call several times to quest about reading as fungus vs melanoma and have not been able to get anyone to call me back.  The appt on 04/21 is likely an appt that was after 12pm and I can discuss that with her when I call.

## 2018-06-04 ENCOUNTER — Telehealth (INDEPENDENT_AMBULATORY_CARE_PROVIDER_SITE_OTHER): Payer: Self-pay | Admitting: Orthopedic Surgery

## 2018-06-04 NOTE — Telephone Encounter (Signed)
Patient has an appointment at [facility] with Dr. Sharol Given.  She is calling to find out if it's possible to do a telehealth appointment instead of coming in.  Please contact patient at home.

## 2018-06-04 NOTE — Telephone Encounter (Signed)
I called pt and advised that Dr. Sharol Given wants to see her in the office. She is very upset and will discuss nail path report with Dr. Sharol Given at visit. States that this was not ordered properly but I advised her that this was sent with a specific order, that Lattie Haw from [facility] called and advised of the ordered what we were looking for and we have tried to reach out multiple time to the lab unable to reach,

## 2018-06-05 ENCOUNTER — Ambulatory Visit (INDEPENDENT_AMBULATORY_CARE_PROVIDER_SITE_OTHER): Payer: 59 | Admitting: Orthopedic Surgery

## 2018-06-05 ENCOUNTER — Encounter (INDEPENDENT_AMBULATORY_CARE_PROVIDER_SITE_OTHER): Payer: Self-pay | Admitting: Orthopedic Surgery

## 2018-06-05 ENCOUNTER — Other Ambulatory Visit: Payer: Self-pay

## 2018-06-05 VITALS — Ht 65.0 in | Wt 188.0 lb

## 2018-06-05 DIAGNOSIS — L608 Other nail disorders: Secondary | ICD-10-CM

## 2018-06-05 NOTE — Telephone Encounter (Signed)
Spoke with Orlando Penner at [facility] lab concerning the toenail specimen which was sent for rule out melanoma and the specimen as now being assessed for melanoma and the lab will update with the results as available  Wileen Duncanson,PA-C

## 2018-06-05 NOTE — Telephone Encounter (Signed)
Angel Sellers from [facility] called office and she indicated that the specimen is now being evaluated for melanoma as previously requested.

## 2018-06-05 NOTE — Progress Notes (Signed)
Office Visit Note   Patient: Angel Sellers           Date of Birth: [birth year]           MRN: [id number] Visit Date: 06/05/2018              Requested by: Lavone Orn, MD [address] [place], [place] [address] PCP: Lavone Orn, MD  Chief Complaint  Patient presents with  . Right Foot - Follow-up    GT nail excision       HPI: Patient presents status post excision of the right great toenail for a black streak on the nail to evaluate for possible myeloma.  We called pathology 4 times and this study was not provided however in further discussions today they will reevaluate the nail for myeloma.  Assessment & Plan: Visit Diagnoses:  1. Nail deformity     Plan: Patient will follow-up as needed we will call her if there is any results from pathology.  Follow-Up Instructions: Return if symptoms worsen or fail to improve.   Ortho Exam  Patient is alert, oriented, no adenopathy, well-dressed, normal affect, normal respiratory effort. On examination the nailbed is well-healed there is no depigmentation of the nailbed no signs of infection.  The previous black stripe in the nail was about 3 mm in width and ran the entire length of the nail.  There is no discoloration in the skin at the nail fold.  There is no discoloration in the matrix of the nail bed.  Imaging: No results found. No images are attached to the encounter.  Labs: Lab Results  Component Value Date   REPTSTATUS 04/09/2017 FINAL 04/04/2017   GRAMSTAIN  03/16/2009    NO WBC SEEN NO SQUAMOUS EPITHELIAL CELLS SEEN NO ORGANISMS SEEN   CULT  04/04/2017    NO GROWTH 5 DAYS Performed at [facility], [place] [address] [place], [place] [address]    [place] 03/16/2009     Lab Results  Component Value Date   ALBUMIN 4.3 02/25/2018   ALBUMIN 4.0 04/03/2017   ALBUMIN 4.2 09/19/2016    Body mass index is 31.28 kg/m.  Orders:  No orders of the defined types were  placed in this encounter.  No orders of the defined types were placed in this encounter.    Procedures: No procedures performed  Clinical Data: No additional findings.  ROS:  All other systems negative, except as noted in the HPI. Review of Systems  Objective: Vital Signs: Ht 5\' 5"  (1.651 m)   Wt 188 lb (85.3 kg)   BMI 31.28 kg/m   Specialty Comments:  No specialty comments available.  PMFS History: Patient Active Problem List   Diagnosis Date Noted  . GERD (gastroesophageal reflux disease) 04/04/2017  . Depression 04/04/2017  . Diarrhea 04/04/2017  . Sepsis ([place]) 04/04/2017  . Volume overload 09/21/2016  . Hypokalemia 09/21/2016  . Hypothyroidism 09/19/2016  . CAP (community acquired pneumonia) 09/19/2016  . Incisional hernia 03/31/2016  . Fusion of toes 02/26/2016  . Closed displaced fracture of middle phalanx of lesser toe of left foot   . History of reduction mammoplasty 04/16/2014  . Neck pain 08/09/2013  . Thumbnail injury 04/10/2013  . Chronic pain 05/28/2012  . Fibromyalgia 05/28/2012  . Postural orthostatic tachycardia syndrome 05/28/2012  . Chronic fatigue syndrome 09/07/2011  . Chronic interstitial cystitis without hematuria 09/07/2011  . Chronic migraine without aura 09/07/2011  . Irritable bowel syndrome without diarrhea  09/07/2011  . 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 Migraine headache 03/18/2008  . Essential hypertension 03/18/2008  . COUGH 03/18/2008   Past Medical History:  Diagnosis Date  . Arthritis   . Asthma    mild obstructive poulmonary disease   . Autoimmune disease ([place])   . Cancer (HCC)    renal  . Chronic fatigue   . Fibromyalgia   . GERD (gastroesophageal reflux disease)   . Headache   . History of kidney cancer   . Hypertension   . Hypothyroidism   . Interstitial cystitis   . Neuromuscular disorder ([place])    nerve  . Norovirus 03/2017  . PONV (postoperative nausea and vomiting)    need  scope patch  . POTS (postural orthostatic tachycardia syndrome)   . Recurrent upper respiratory infection (URI)    pneumonia  . Thyroid disease   . Urticaria     Family History  Problem Relation Age of Onset  . Melanoma Mother   . Kidney cancer Sister   . Kidney cancer Brother     Past Surgical History:  Procedure Laterality Date  . ABDOMINAL HYSTERECTOMY  1999  . BONE BIOPSY Right 05/01/2013   Procedure: BIOPSY NAIL BED RIGHT THUMB, BIOPSY RIGHT GREAT TOE ;  Surgeon: Wynonia Sours, MD;  Location: [facility];  Service: Orthopedics;  Laterality: Right;  . CESAREAN SECTION     2 c-cestions  . COLONOSCOPY    . CYSTOSCOPY    . DISTAL INTERPHALANGEAL JOINT FUSION Left 02/26/2016   Procedure: LEFT 5TH TOE DISTAL INTERPHALANGEAL JOINT FUSION;  Surgeon: Newt Minion, MD;  Location: [facility];  Service: Orthopedics;  Laterality: Left;  . INCISIONAL HERNIA REPAIR N/A 03/31/2016   Procedure: LAPAROSCOPIC ASSISTED REPAIR OF INCISIONAL HERNIA;  Surgeon: Greer Pickerel, MD;  Location: [facility];  Service: General;  Laterality: N/A;  . INSERTION OF MESH N/A 03/31/2016   Procedure: INSERTION OF MESH;  Surgeon: Greer Pickerel, MD;  Location: [facility];  Service: General;  Laterality: N/A;  . KIDNEY SURGERY  2011   rt partial nephrectomy-[facility]  . TONSILLECTOMY     Social History   Occupational History  . Not on file  Tobacco Use  . Smoking status: Never Smoker  . Smokeless tobacco: Never Used  Substance and Sexual Activity  . Alcohol use: No  . Drug use: No  . Sexual activity: Not on file

## 2018-06-06 LAB — PATHOLOGY REPORT

## 2018-06-06 LAB — TISSUE SPECIMEN

## 2018-06-11 NOTE — Progress Notes (Signed)
Called pt to advise of message she states that she was already aware. To call with any questions.

## 2018-06-22 DIAGNOSIS — G43009 Migraine without aura, not intractable, without status migrainosus: Secondary | ICD-10-CM | POA: Diagnosis not present

## 2018-06-25 DIAGNOSIS — M5442 Lumbago with sciatica, left side: Secondary | ICD-10-CM | POA: Diagnosis not present

## 2018-06-25 DIAGNOSIS — R509 Fever, unspecified: Secondary | ICD-10-CM | POA: Diagnosis not present

## 2018-06-26 DIAGNOSIS — R5382 Chronic fatigue, unspecified: Secondary | ICD-10-CM | POA: Diagnosis not present

## 2018-06-26 DIAGNOSIS — Z85528 Personal history of other malignant neoplasm of kidney: Secondary | ICD-10-CM | POA: Diagnosis not present

## 2018-06-26 DIAGNOSIS — N3010 Interstitial cystitis (chronic) without hematuria: Secondary | ICD-10-CM | POA: Diagnosis not present

## 2018-06-26 DIAGNOSIS — M5442 Lumbago with sciatica, left side: Secondary | ICD-10-CM | POA: Diagnosis not present

## 2018-06-26 DIAGNOSIS — K589 Irritable bowel syndrome without diarrhea: Secondary | ICD-10-CM | POA: Diagnosis not present

## 2018-06-26 DIAGNOSIS — W19XXXD Unspecified fall, subsequent encounter: Secondary | ICD-10-CM | POA: Diagnosis not present

## 2018-06-26 DIAGNOSIS — M797 Fibromyalgia: Secondary | ICD-10-CM | POA: Diagnosis not present

## 2018-06-28 DIAGNOSIS — M5442 Lumbago with sciatica, left side: Secondary | ICD-10-CM | POA: Diagnosis not present

## 2018-06-28 DIAGNOSIS — M545 Low back pain: Secondary | ICD-10-CM | POA: Diagnosis not present

## 2018-06-29 DIAGNOSIS — M5442 Lumbago with sciatica, left side: Secondary | ICD-10-CM | POA: Diagnosis not present

## 2018-06-30 ENCOUNTER — Encounter (HOSPITAL_COMMUNITY): Payer: Self-pay | Admitting: Emergency Medicine

## 2018-06-30 ENCOUNTER — Other Ambulatory Visit: Payer: Self-pay

## 2018-06-30 ENCOUNTER — Emergency Department (HOSPITAL_COMMUNITY)
Admission: EM | Admit: 2018-06-30 | Discharge: 2018-06-30 | Disposition: A | Discharge status: Home / Self Care | Payer: 59 | Attending: Emergency Medicine | Admitting: Emergency Medicine

## 2018-06-30 ENCOUNTER — Emergency Department (HOSPITAL_COMMUNITY): Payer: 59

## 2018-06-30 DIAGNOSIS — L03116 Cellulitis of left lower limb: Secondary | ICD-10-CM | POA: Diagnosis not present

## 2018-06-30 DIAGNOSIS — E039 Hypothyroidism, unspecified: Secondary | ICD-10-CM | POA: Insufficient documentation

## 2018-06-30 DIAGNOSIS — J45909 Unspecified asthma, uncomplicated: Secondary | ICD-10-CM | POA: Insufficient documentation

## 2018-06-30 DIAGNOSIS — Z85528 Personal history of other malignant neoplasm of kidney: Secondary | ICD-10-CM | POA: Insufficient documentation

## 2018-06-30 DIAGNOSIS — I10 Essential (primary) hypertension: Secondary | ICD-10-CM | POA: Insufficient documentation

## 2018-06-30 DIAGNOSIS — M25469 Effusion, unspecified knee: Secondary | ICD-10-CM

## 2018-06-30 DIAGNOSIS — M25462 Effusion, left knee: Secondary | ICD-10-CM | POA: Diagnosis not present

## 2018-06-30 DIAGNOSIS — Z79899 Other long term (current) drug therapy: Secondary | ICD-10-CM | POA: Diagnosis not present

## 2018-06-30 DIAGNOSIS — R2242 Localized swelling, mass and lump, left lower limb: Secondary | ICD-10-CM | POA: Diagnosis present

## 2018-06-30 LAB — CBC WITH DIFFERENTIAL/PLATELET
Abs Immature Granulocytes: 0.04 10*3/uL (ref 0.00–0.07)
Basophils Absolute: 0.1 10*3/uL (ref 0.0–0.1)
Basophils Relative: 1 %
Eosinophils Absolute: 0.2 10*3/uL (ref 0.0–0.5)
Eosinophils Relative: 3 %
HCT: 40.5 % (ref 36.0–46.0)
Hemoglobin: 13.1 g/dL (ref 12.0–15.0)
Immature Granulocytes: 1 %
Lymphocytes Relative: 36 %
Lymphs Abs: 2.9 10*3/uL (ref 0.7–4.0)
MCH: 27.9 pg (ref 26.0–34.0)
MCHC: 32.3 g/dL (ref 30.0–36.0)
MCV: 86.4 fL (ref 80.0–100.0)
Monocytes Absolute: 0.7 10*3/uL (ref 0.1–1.0)
Monocytes Relative: 9 %
Neutro Abs: 4.2 10*3/uL (ref 1.7–7.7)
Neutrophils Relative %: 50 %
Platelets: 222 10*3/uL (ref 150–400)
RBC: 4.69 MIL/uL (ref 3.87–5.11)
RDW: 13.4 % (ref 11.5–15.5)
WBC: 8.1 10*3/uL (ref 4.0–10.5)
nRBC: 0 % (ref 0.0–0.2)

## 2018-06-30 LAB — BASIC METABOLIC PANEL
Anion gap: 9 (ref 5–15)
BUN: 10 mg/dL (ref 6–20)
CO2: 23 mmol/L (ref 22–32)
Calcium: 9.1 mg/dL (ref 8.9–10.3)
Chloride: 105 mmol/L (ref 98–111)
Creatinine, Ser: 0.83 mg/dL (ref 0.44–1.00)
GFR calc Af Amer: >60 mL/min (ref >60)
GFR calc non Af Amer: >60 mL/min (ref >60)
Glucose, Bld: 155 mg/dL — ABNORMAL HIGH (ref 70–99)
Potassium: 3.6 mmol/L (ref 3.5–5.1)
Sodium: 137 mmol/L (ref 135–145)

## 2018-06-30 LAB — LACTIC ACID, PLASMA: Lactic Acid, Venous: 1.9 mmol/L (ref 0.5–1.9)

## 2018-06-30 LAB — C-REACTIVE PROTEIN: CRP: 1.6 mg/dL — ABNORMAL HIGH (ref <1.0)

## 2018-06-30 LAB — SEDIMENTATION RATE: Sed Rate: 3 mm/hr (ref 0–22)

## 2018-06-30 MED ORDER — KETOROLAC TROMETHAMINE 30 MG/ML IJ SOLN
30.0000 mg | Freq: Once | INTRAMUSCULAR | Status: AC
  Administered 2018-06-30: 30 mg via INTRAMUSCULAR
  Filled 2018-06-30: qty 1

## 2018-06-30 MED ORDER — LIDOCAINE HCL (PF) 1 % IJ SOLN
30.0000 mL | Freq: Once | INTRAMUSCULAR | Status: AC
  Administered 2018-06-30: 22:00:00 30 mL
  Filled 2018-06-30: qty 30

## 2018-06-30 MED ORDER — CEPHALEXIN 500 MG PO CAPS: 500.0000 mg | ORAL_CAPSULE | Freq: Four times a day (QID) | ORAL | 0 refills | Status: AC

## 2018-06-30 MED ORDER — KETOROLAC TROMETHAMINE 30 MG/ML IJ SOLN: 30.0000 mg | Freq: Once | INTRAMUSCULAR | Status: AC

## 2018-06-30 NOTE — ED Notes (Addendum)
delay in lab draw,  Pt not in room.

## 2018-06-30 NOTE — ED Triage Notes (Signed)
Pt fell into wall 1 week ago. No pain or injuries then, pain started in left knee 2 days ago. States she is unable to bear weight now or lift knee. Distal pulses intact, knee swollen, red, and warm to touch. Pt sent by Dr. Rush Farmer for MRI

## 2018-06-30 NOTE — ED Notes (Signed)
Pt not in room at this time

## 2018-06-30 NOTE — Discharge Instructions (Signed)
It was my pleasure taking care of you today!   We have sent your fluid sample to the lab for culture.  Your blood work looked good.   Because of the redness on your knee around the scab, I am placing you on some antibiotics.   Please call orthopedics on Monday morning to schedule a follow up appointment.   Return to ER should your symptoms worsen, new symptoms develop or you have any additional concerns.

## 2018-06-30 NOTE — ED Notes (Signed)
Pt has been in contact with spouse.

## 2018-06-30 NOTE — ED Notes (Signed)
Patient verbalizes understanding of discharge instructions. Opportunity for questioning and answers were provided. Armband removed by staff, pt discharged from ED.  

## 2018-06-30 NOTE — ED Provider Notes (Signed)
.  Joint Aspiration/Arthrocentesis Date/Time: 06/30/2018 10:05 PM Performed by: Sharlett Iles, MD Authorized by: Sharlett Iles, MD   Consent:    Consent obtained:  Verbal   Consent given by:  Patient   Risks discussed:  Infection, incomplete drainage and pain   Alternatives discussed:  No treatment Location:    Location:  Knee   Knee:  L knee Anesthesia (see MAR for exact dosages):    Anesthesia method:  Local infiltration   Local anesthetic:  Lidocaine 1% w/o epi Procedure details:    Preparation: Patient was prepped and draped in usual sterile fashion     Needle gauge: 21.   Ultrasound guidance: no     Approach:  Lateral   Aspirate amount:  5 ml   Aspirate characteristics:  Clear   Steroid injected: no     Specimen collected: yes   Post-procedure details:    Dressing:  Adhesive bandage   Patient tolerance of procedure:  Tolerated well, no immediate complications      Little, Wenda Overland, MD 06/30/18 2206

## 2018-06-30 NOTE — ED Provider Notes (Signed)
[facility] EMERGENCY DEPARTMENT Provider Note   CSN: [id number] Arrival date & time: 06/30/18  1932    History   Chief Complaint Chief Complaint  Patient presents with   Knee Pain    HPI Lovenia A Hanif is a 58 y.o. female.     The history is provided by the patient and medical records. No language interpreter was used.  Knee Pain  Associated symptoms: fever    Jakyrah A Charlie is a 58 y.o. female  with a PMH as listed below who presents to the Emergency Department complaining of acute onset of left knee pain two days ago. No history of previous knee injury or joint replacement. She states that she did have a fall about a week ago, but did not hurt her knee then. She denies any known injury or trauma of knee. A small scab noticed on exam to medial aspect of the knee. She states this happened several days ago. It was not a bite. She was just itching and caused some irritation to this area. She has been applying ice and keeping it elevated, but pain and swelling has progressively worsened. Now she has pain with any movement of the knee and noticed the skin on top is red as well. She normally sees Dr. Sharol Given with orthopedics, so she called their office. She states that she spoke with Dr. Rush Farmer on the phone who recommended that she come to the ER. Low grade temp of 100 in triage. Patient states that she did not know that she had a fever. No cough, congestion, shortness of breath, sore throat. No abdominal pain, n/v/d or urinary symptoms.   Past Medical History:  Diagnosis Date   Arthritis    Asthma    mild obstructive poulmonary disease    Autoimmune disease ([place])    Cancer (HCC)    renal   Chronic fatigue    Fibromyalgia    GERD (gastroesophageal reflux disease)    Headache    History of kidney cancer    Hypertension    Hypothyroidism    Interstitial cystitis    Neuromuscular disorder ([place])    nerve   Norovirus 03/2017   PONV (postoperative  nausea and vomiting)    need scope patch   POTS (postural orthostatic tachycardia syndrome)    Recurrent upper respiratory infection (URI)    pneumonia   Thyroid disease    Urticaria     Patient Active Problem List   Diagnosis Date Noted   GERD (gastroesophageal reflux disease) 04/04/2017   Depression 04/04/2017   Diarrhea 04/04/2017   Sepsis ([place]) 04/04/2017   Volume overload 09/21/2016   Hypokalemia 09/21/2016   Hypothyroidism 09/19/2016   CAP (community acquired pneumonia) 09/19/2016   Incisional hernia 03/31/2016   Fusion of toes 02/26/2016   Closed displaced fracture of middle phalanx of lesser toe of left foot    History of reduction mammoplasty 04/16/2014   Neck pain 08/09/2013   Thumbnail injury 04/10/2013   Chronic pain 05/28/2012   Fibromyalgia 05/28/2012   Postural orthostatic tachycardia syndrome 05/28/2012   Chronic fatigue syndrome 09/07/2011   Chronic interstitial cystitis without hematuria 09/07/2011   Chronic migraine without aura 09/07/2011   Irritable bowel syndrome without diarrhea 09/07/2011   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 09/07/2011   Migraine headache 03/18/2008   Essential hypertension 03/18/2008   COUGH 03/18/2008    Past Surgical History:  Procedure Laterality Date   ABDOMINAL HYSTERECTOMY  1999   BONE BIOPSY  Right 05/01/2013   Procedure: BIOPSY NAIL BED RIGHT THUMB, BIOPSY RIGHT GREAT TOE ;  Surgeon: Wynonia Sours, MD;  Location: [facility];  Service: Orthopedics;  Laterality: Right;   CESAREAN SECTION     2 c-cestions   COLONOSCOPY     CYSTOSCOPY     DISTAL INTERPHALANGEAL JOINT FUSION Left 02/26/2016   Procedure: LEFT 5TH TOE DISTAL INTERPHALANGEAL JOINT FUSION;  Surgeon: Newt Minion, MD;  Location: [facility];  Service: Orthopedics;  Laterality: Left;   INCISIONAL HERNIA REPAIR N/A 03/31/2016   Procedure: LAPAROSCOPIC ASSISTED REPAIR OF INCISIONAL HERNIA;  Surgeon: Greer Pickerel, MD;  [facility];  Service: General;  Laterality: N/A;   INSERTION OF MESH N/A 03/31/2016   Procedure: INSERTION OF MESH;  Surgeon: Greer Pickerel, MD;  [facility];  Service: General;  Laterality: N/A;   KIDNEY SURGERY  2011   rt partial nephrectomy-[facility]   TONSILLECTOMY       OB History   No obstetric history on file.      Home Medications    Prior to Admission medications   Medication Sig Start Date End Date Taking? Authorizing Provider  albuterol (PROVENTIL HFA;VENTOLIN HFA) 108 (90 Base) MCG/ACT inhaler Inhale 2 puffs into the lungs every 6 (six) hours as needed for wheezing or shortness of breath.    [provider]  azithromycin (ZITHROMAX) 250 MG tablet Take 1 tablet (250 mg total) by mouth daily. Take 1 every day until finished. 02/25/18   Mesner, Corene Cornea, MD  cephALEXin (KEFLEX) 500 MG capsule Take 1 capsule (500 mg total) by mouth 4 (four) times daily for 7 days. 06/30/18 07/07/18  Zafiro Routson, Ozella Almond, PA-C  Cholecalciferol (VITAMIN D) 2000 units tablet Take 2,000 Units by mouth daily.    [provider]  DULoxetine (CYMBALTA) 30 MG capsule Take 90 mg by mouth daily.     [provider]  DULoxetine (CYMBALTA) 60 MG capsule Take 90 mg by mouth daily.     [provider]  hydrOXYzine (ATARAX/VISTARIL) 50 MG tablet Take by mouth.    [provider]  ketorolac (TORADOL) 10 MG tablet Take 1 tablet (10 mg total) by mouth 3 (three) times daily with meals as needed for moderate pain or severe pain. 12/27/17   Rayburn, Neta Mends, PA-C  levocetirizine (XYZAL) 5 MG tablet Take 1 tablet (5 mg total) by mouth every evening. 08/15/17   Kozlow, Donnamarie Poag, MD  levothyroxine (SYNTHROID, LEVOTHROID) 112 MCG tablet Take 125 mcg by mouth every other day.  11/22/15   [provider]  loratadine (CLARITIN) 10 MG tablet Take 1 tablet (10 mg total) by mouth every morning. 08/15/17   Kozlow, Donnamarie Poag, MD  methocarbamol (ROBAXIN) 500 MG  tablet Take 500 mg by mouth 3 (three) times daily as needed (for muscle spasm/pain.).     [provider]  modafinil (PROVIGIL) 100 MG tablet TAKE 1 TABLET BY MOUTH ONCE DAILY IN THE MORNING FOR 10 DAYS 08/05/17   [provider]  mupirocin ointment (BACTROBAN) 2 %  06/08/17   [provider]  olmesartan (BENICAR) 5 MG tablet Take 5 mg by mouth daily.  11/22/15   [provider]  pantoprazole (PROTONIX) 40 MG tablet  06/08/17   [provider]  predniSONE (DELTASONE) 20 MG tablet 2 tabs po daily x 4 days 02/25/18   Mesner, Corene Cornea, MD  promethazine (PHENERGAN) 25 MG tablet Take 25 mg by mouth every 6 (six) hours as needed (for  nausea/vomiting with migraines.).  02/06/12   [provider]  ranitidine (ZANTAC) 300 MG capsule Take 1 capsule (300 mg total) by mouth 2 (two) times daily. 08/15/17   Kozlow, Donnamarie Poag, MD  rizatriptan (MAXALT) 10 MG tablet Take 10 mg by mouth daily as needed for migraine. May repeat in 2 hours if needed    [provider]  tapentadol (NUCYNTA) 50 MG TABS tablet Take 25 mg by mouth daily as needed (for pain due to exertion/falls).     [provider]  topiramate (TOPAMAX) 50 MG tablet Take by mouth.    [provider]    Family History Family History  Problem Relation Age of Onset   Melanoma Mother    Kidney cancer Sister    Kidney cancer Brother     Social History Social History   Tobacco Use   Smoking status: Never Smoker   Smokeless tobacco: Never Used  Substance Use Topics   Alcohol use: No   Drug use: No     Allergies   Other; Dilaudid [hydromorphone hcl]; Hydromorphone; Adderall [amphetamine-dextroamphetamine]; Ciprofloxacin; Lactose; Lactose intolerance (gi); Meperidine; and Morphine   Review of Systems Review of Systems  Constitutional: Positive for fever.  Musculoskeletal: Positive for arthralgias, joint swelling and myalgias.  Skin: Positive for color change and  wound.  Neurological: Negative for weakness and numbness.  All other systems reviewed and are negative.    Physical Exam Updated Vital Signs BP 123/73    Pulse (!) 101    Temp 98.9 F (37.2 C) (Oral)    Resp 16    SpO2 97%   Physical Exam Vitals signs and nursing note reviewed.  Constitutional:      General: She is not in acute distress.    Appearance: She is well-developed.  HENT:     Head: Normocephalic and atraumatic.  Neck:     Musculoskeletal: Neck supple.  Cardiovascular:     Rate and Rhythm: Regular rhythm. Tachycardia present.     Heart sounds: Normal heart sounds. No murmur.  Pulmonary:     Effort: Pulmonary effort is normal. No respiratory distress.     Breath sounds: Normal breath sounds.  Abdominal:     General: There is no distension.     Palpations: Abdomen is soft.     Tenderness: There is no abdominal tenderness.  Musculoskeletal:     Comments: Left knee swollen and exquisitely tender to the touch.  There is overlying erythema and warmth.  She does have a scab about 5 mm in size to the medial aspect of the knee. Good ROM although does have significant amount of pain with flexion/extension. 2+ DP.  Sensation intact.  Skin:    General: Skin is warm and dry.  Neurological:     Mental Status: She is alert and oriented to person, place, and time.      ED Treatments / Results  Labs (all labs ordered are listed, but only abnormal results are displayed) Labs Reviewed  BASIC METABOLIC PANEL - Abnormal; Notable for the following components:      Result Value   Glucose, Bld 155 (*)    All other components within normal limits  C-REACTIVE PROTEIN - Abnormal; Notable for the following components:   CRP 1.6 (*)    All other components within normal limits  CULTURE, BLOOD (ROUTINE X 2)  CULTURE, BLOOD (ROUTINE X 2)  BODY FLUID CULTURE  ANAEROBIC CULTURE  CBC WITH DIFFERENTIAL/PLATELET  LACTIC ACID, PLASMA  SEDIMENTATION RATE  LACTIC ACID, PLASMA  SYNOVIAL  CELL COUNT + DIFF, W/ CRYSTALS    EKG None  Radiology Dg Knee Complete 4 Views Left  Result Date: 06/30/2018 CLINICAL DATA:  Left knee pain and swelling. Atraumatic. EXAM: LEFT KNEE - COMPLETE 4+ VIEW COMPARISON:  None. FINDINGS: No evidence of fracture or dislocation. No bony destructive change. Trace joint effusion. No evidence of arthropathy or other focal bone abnormality. Soft tissues are unremarkable. IMPRESSION: Trace joint effusion. No acute osseous abnormality. Electronically Signed   By: Keith Rake M.D.   On: 06/30/2018 21:02    Procedures Procedures (including critical care time)  Medications Ordered in ED Medications  ketorolac (TORADOL) 30 MG/ML injection 30 mg ( Intravenous See Alternative 06/30/18 2038)    Or  ketorolac (TORADOL) 30 MG/ML injection 30 mg (30 mg Intramuscular Given 06/30/18 2038)  lidocaine (PF) (XYLOCAINE) 1 % injection 30 mL (30 mLs Infiltration Given by Other 06/30/18 2138)     Initial Impression / Assessment and Plan / ED Course  I have reviewed the triage vital signs and the nursing notes.  Pertinent labs & imaging results that were available during my care of the patient were reviewed by me and considered in my medical decision making (see chart for details).       Dierdre A Gray is a 58 y.o. female who presents to ED for left knee pain and swelling x 2 days. Patient endorses falling last week, but denies knee injury at that time. She has no known inciting event for pain. Temp of 100 at triage. She was unaware that she had a fever. Mildly tachycardic, but regular. She called orthopedics and was told to come to the ER for further evaluation. Triage note states that Dr. Rush Farmer sent her over for MRI. I spoke with Dr. Ninfa Linden upon her arrival. He confirms that he did send her to ER for work up, but not for MRI. I discussed vitals and exam with him as well as concerns for possible septic knee given low grade temp, tachycardia and redness. She does  have a small scab on the knee for potential source. Recommended continuing ER work up including arthrocentesis and calling him back if concerns for septic joint.   White count and sed rate normal. Minimal elevation in CRP at 1.6. X-ray showing only trace effusion with no bony abnormalities. There was only enough fluid to perform culture and gram stain. Rare WBC's with no organisms seen. Given her overall work up thus far, doubt septic joint. Will treat for cellulitis with keflex. Placed in knee immobilizer and will have for follow up with ortho. Of note, patient made me aware that Dr. Ninfa Linden did come to bedside and see her in ER. She reports that he evaluated the knee and told her it was not infected. I did not speak with him and was not aware that he stopped by to see patient. No note in Epic. Appears we are on the same page that we do not believe it is a septic joint.    Patient seen by and discussed with Dr. Rex Kras who agrees with treatment plan.    Final Clinical Impressions(s) / ED Diagnoses   Final diagnoses:  Knee swelling  Cellulitis of left lower extremity    ED Discharge Orders         Ordered    cephALEXin (KEFLEX) 500 MG capsule  4 times daily     06/30/18 2316           Mee Macdonnell,  Ozella Almond, PA-C 06/30/18 2355    Little, Wenda Overland, MD 07/04/18 1620

## 2018-07-03 DIAGNOSIS — M48061 Spinal stenosis, lumbar region without neurogenic claudication: Secondary | ICD-10-CM | POA: Diagnosis not present

## 2018-07-03 LAB — BODY FLUID CULTURE: Culture: NO GROWTH

## 2018-07-03 LAB — SYNOVIAL CELL COUNT + DIFF, W/ CRYSTALS
Crystals, Fluid: NONE SEEN
WBC, Synovial: 25 /mm3 (ref 0–200)

## 2018-07-05 LAB — CULTURE, BLOOD (ROUTINE X 2)
Culture: NO GROWTH
Culture: NO GROWTH
Special Requests: ADEQUATE

## 2018-07-05 LAB — ANAEROBIC CULTURE: Culture: NO GROWTH

## 2018-07-06 DIAGNOSIS — M25562 Pain in left knee: Secondary | ICD-10-CM | POA: Diagnosis not present

## 2018-07-12 IMAGING — DX DG CHEST 2V
2 series · 2 of 2 positions shown · non-contrast
Comparison: Chest x-ray 09/19/2016.

CLINICAL DATA: 56-year-old female history of pneumonia. No current
chest pain. Some shortness of breath.

EXAM:
CHEST  2 VIEW

[chest pa]
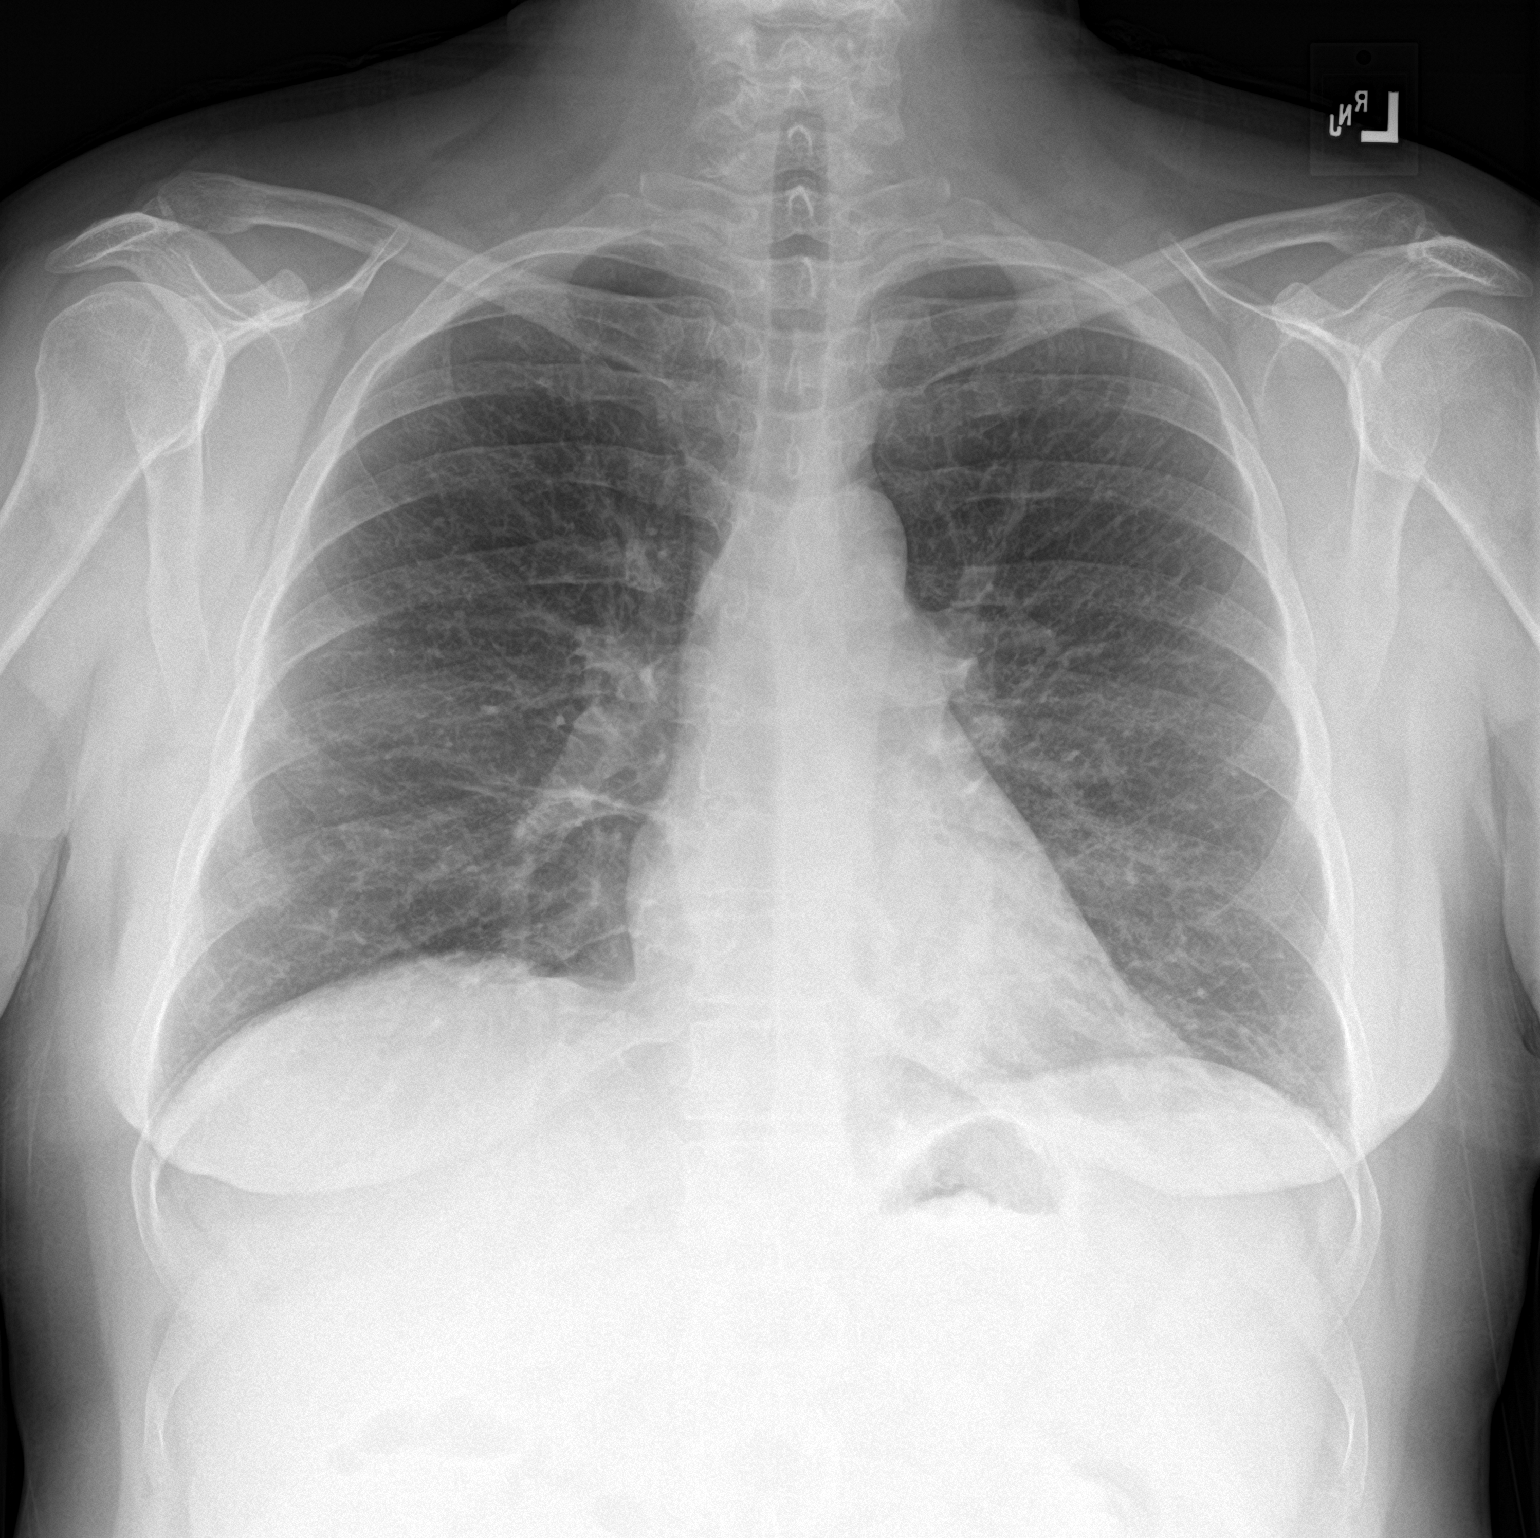

[chest lat]
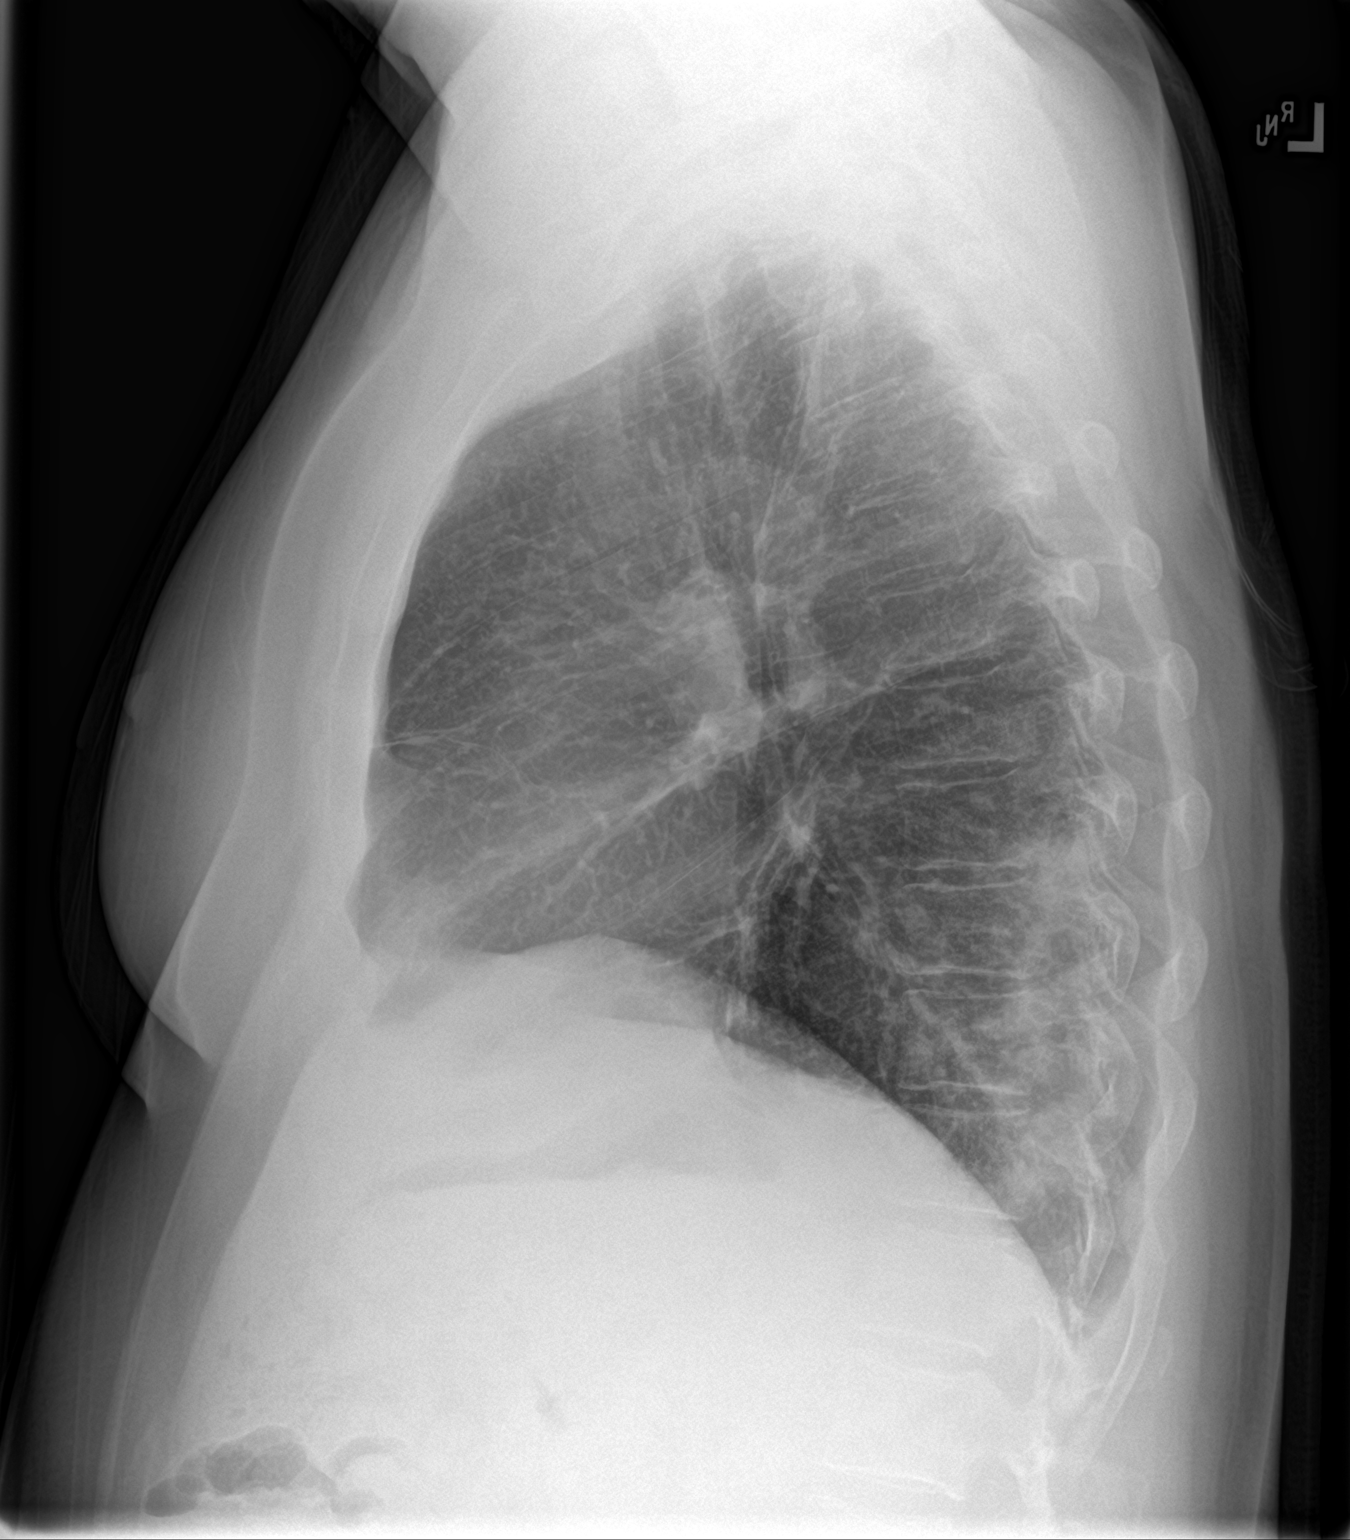

[2 of 2 positions shown; findings below may reference images not displayed]

FINDINGS: Previously noted ill-defined airspace consolidation in the left
lower lobe persists, compatible with persistent bronchopneumonia.
Cyst predominantly in the posterior aspect of the left lower lobe.
Mild diffuse peribronchial cuffing. Trace left pleural effusion. No
right pleural effusion. No pneumothorax. No suspicious appearing
pulmonary nodules or masses. No evidence of pulmonary edema. Heart
size is normal. Upper mediastinal contours are within normal limits.
IMPRESSION: 1. The appearance of the chest is compatible with a background of
bronchitis with persistent bronchopneumonia in the left lower lobe
and trace left pleural effusion.

## 2018-07-18 ENCOUNTER — Ambulatory Visit (HOSPITAL_COMMUNITY)
Admission: RE | Admit: 2018-07-18 | Discharge: 2018-07-18 | Disposition: A | Discharge status: Home / Self Care | Payer: 59 | Attending: Psychiatry | Admitting: Psychiatry

## 2018-07-18 DIAGNOSIS — F419 Anxiety disorder, unspecified: Secondary | ICD-10-CM | POA: Insufficient documentation

## 2018-07-18 DIAGNOSIS — F332 Major depressive disorder, recurrent severe without psychotic features: Secondary | ICD-10-CM | POA: Diagnosis not present

## 2018-07-18 DIAGNOSIS — R45851 Suicidal ideations: Secondary | ICD-10-CM | POA: Insufficient documentation

## 2018-07-18 DIAGNOSIS — R450 Nervousness: Secondary | ICD-10-CM | POA: Diagnosis not present
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
## 2018-08-15 ENCOUNTER — Other Ambulatory Visit: Payer: Self-pay | Admitting: Internal Medicine

## 2018-08-15 ENCOUNTER — Other Ambulatory Visit (HOSPITAL_BASED_OUTPATIENT_CLINIC_OR_DEPARTMENT_OTHER): Payer: Self-pay | Admitting: Internal Medicine

## 2018-08-15 ENCOUNTER — Other Ambulatory Visit: Payer: Self-pay

## 2018-08-15 ENCOUNTER — Ambulatory Visit (HOSPITAL_BASED_OUTPATIENT_CLINIC_OR_DEPARTMENT_OTHER)
Admission: RE | Admit: 2018-08-15 | Discharge: 2018-08-15 | Disposition: A | Discharge status: Home / Self Care | Payer: 59 | Source: Ambulatory Visit | Attending: Internal Medicine | Admitting: Internal Medicine

## 2018-08-15 DIAGNOSIS — R1906 Epigastric swelling, mass or lump: Secondary | ICD-10-CM | POA: Diagnosis not present

## 2018-08-15 DIAGNOSIS — R1900 Intra-abdominal and pelvic swelling, mass and lump, unspecified site: Secondary | ICD-10-CM | POA: Diagnosis not present

## 2018-08-20 ENCOUNTER — Other Ambulatory Visit: Payer: Self-pay | Admitting: General Surgery

## 2018-08-20 ENCOUNTER — Other Ambulatory Visit (HOSPITAL_BASED_OUTPATIENT_CLINIC_OR_DEPARTMENT_OTHER): Payer: Self-pay | Admitting: General Surgery

## 2018-08-20 DIAGNOSIS — M7989 Other specified soft tissue disorders: Secondary | ICD-10-CM

## 2018-08-24 ENCOUNTER — Encounter (HOSPITAL_BASED_OUTPATIENT_CLINIC_OR_DEPARTMENT_OTHER): Payer: Self-pay

## 2018-08-24 ENCOUNTER — Ambulatory Visit (HOSPITAL_BASED_OUTPATIENT_CLINIC_OR_DEPARTMENT_OTHER): Payer: 59

## 2018-08-30 ENCOUNTER — Other Ambulatory Visit: Payer: Medicare Other

## 2018-09-12 ENCOUNTER — Ambulatory Visit: Payer: Medicare Other | Admitting: Family

## 2018-09-24 DIAGNOSIS — J4521 Mild intermittent asthma with (acute) exacerbation: Secondary | ICD-10-CM | POA: Diagnosis not present

## 2018-09-24 DIAGNOSIS — Z0000 Encounter for general adult medical examination without abnormal findings: Secondary | ICD-10-CM | POA: Diagnosis not present

## 2018-09-24 DIAGNOSIS — R7301 Impaired fasting glucose: Secondary | ICD-10-CM | POA: Diagnosis not present

## 2018-09-24 DIAGNOSIS — I10 Essential (primary) hypertension: Secondary | ICD-10-CM | POA: Diagnosis not present

## 2018-09-24 DIAGNOSIS — E039 Hypothyroidism, unspecified: Secondary | ICD-10-CM | POA: Diagnosis not present

## 2018-09-24 DIAGNOSIS — Z13220 Encounter for screening for lipoid disorders: Secondary | ICD-10-CM | POA: Diagnosis not present

## 2018-09-24 DIAGNOSIS — K219 Gastro-esophageal reflux disease without esophagitis: Secondary | ICD-10-CM | POA: Diagnosis not present

## 2018-10-25 DIAGNOSIS — R5382 Chronic fatigue, unspecified: Secondary | ICD-10-CM | POA: Diagnosis not present

## 2018-10-25 DIAGNOSIS — N9489 Other specified conditions associated with female genital organs and menstrual cycle: Secondary | ICD-10-CM | POA: Diagnosis not present

## 2018-10-25 DIAGNOSIS — M797 Fibromyalgia: Secondary | ICD-10-CM | POA: Diagnosis not present

## 2018-10-25 DIAGNOSIS — K589 Irritable bowel syndrome without diarrhea: Secondary | ICD-10-CM | POA: Diagnosis not present

## 2018-10-25 DIAGNOSIS — M5386 Other specified dorsopathies, lumbar region: Secondary | ICD-10-CM | POA: Diagnosis not present

## 2018-10-25 DIAGNOSIS — R29898 Other symptoms and signs involving the musculoskeletal system: Secondary | ICD-10-CM | POA: Diagnosis not present

## 2018-11-09 ENCOUNTER — Other Ambulatory Visit: Payer: Self-pay | Admitting: Obstetrics & Gynecology

## 2018-11-09 DIAGNOSIS — R921 Mammographic calcification found on diagnostic imaging of breast: Secondary | ICD-10-CM

## 2018-11-15 DIAGNOSIS — M25512 Pain in left shoulder: Secondary | ICD-10-CM | POA: Diagnosis not present

## 2018-11-15 DIAGNOSIS — K589 Irritable bowel syndrome without diarrhea: Secondary | ICD-10-CM | POA: Diagnosis not present

## 2018-11-15 DIAGNOSIS — M5386 Other specified dorsopathies, lumbar region: Secondary | ICD-10-CM | POA: Diagnosis not present

## 2018-11-15 DIAGNOSIS — M7502 Adhesive capsulitis of left shoulder: Secondary | ICD-10-CM | POA: Diagnosis not present

## 2018-11-15 DIAGNOSIS — N9489 Other specified conditions associated with female genital organs and menstrual cycle: Secondary | ICD-10-CM | POA: Diagnosis not present

## 2018-11-15 DIAGNOSIS — R5382 Chronic fatigue, unspecified: Secondary | ICD-10-CM | POA: Diagnosis not present

## 2018-11-15 DIAGNOSIS — N941 Unspecified dyspareunia: Secondary | ICD-10-CM | POA: Diagnosis not present

## 2018-11-15 DIAGNOSIS — M797 Fibromyalgia: Secondary | ICD-10-CM | POA: Diagnosis not present

## 2018-11-15 DIAGNOSIS — N3010 Interstitial cystitis (chronic) without hematuria: Secondary | ICD-10-CM | POA: Diagnosis not present

## 2018-11-15 DIAGNOSIS — R29898 Other symptoms and signs involving the musculoskeletal system: Secondary | ICD-10-CM | POA: Diagnosis not present

## 2018-11-16 ENCOUNTER — Other Ambulatory Visit: Payer: Self-pay

## 2018-11-16 ENCOUNTER — Ambulatory Visit
Admission: RE | Admit: 2018-11-16 | Discharge: 2018-11-16 | Disposition: A | Discharge status: Home / Self Care | Payer: 59 | Source: Ambulatory Visit | Attending: Obstetrics & Gynecology | Admitting: Obstetrics & Gynecology

## 2018-11-16 ENCOUNTER — Other Ambulatory Visit: Payer: Self-pay | Admitting: Obstetrics & Gynecology

## 2018-11-16 DIAGNOSIS — R921 Mammographic calcification found on diagnostic imaging of breast: Secondary | ICD-10-CM

## 2018-11-16 DIAGNOSIS — N641 Fat necrosis of breast: Secondary | ICD-10-CM | POA: Diagnosis not present

## 2018-11-20 DIAGNOSIS — R102 Pelvic and perineal pain: Secondary | ICD-10-CM | POA: Diagnosis not present

## 2018-11-22 DIAGNOSIS — N9489 Other specified conditions associated with female genital organs and menstrual cycle: Secondary | ICD-10-CM | POA: Diagnosis not present

## 2018-11-22 DIAGNOSIS — K589 Irritable bowel syndrome without diarrhea: Secondary | ICD-10-CM | POA: Diagnosis not present

## 2018-11-22 DIAGNOSIS — N3010 Interstitial cystitis (chronic) without hematuria: Secondary | ICD-10-CM | POA: Diagnosis not present

## 2018-11-22 DIAGNOSIS — R29898 Other symptoms and signs involving the musculoskeletal system: Secondary | ICD-10-CM | POA: Diagnosis not present

## 2018-11-22 DIAGNOSIS — N941 Unspecified dyspareunia: Secondary | ICD-10-CM | POA: Diagnosis not present

## 2018-11-22 DIAGNOSIS — M5386 Other specified dorsopathies, lumbar region: Secondary | ICD-10-CM | POA: Diagnosis not present

## 2018-11-30 ENCOUNTER — Telehealth: Payer: Self-pay | Admitting: *Deleted

## 2018-11-30 NOTE — Telephone Encounter (Signed)
Called and spoke with the patient; explained that Dr. Tucker is not credentialed with her insurance. Explained that we need to cancel her appt for 10/21 otherwise she will be stuck with a large bill. Explained that if Dr. Tucker is not credentialed within a couple of weeks, we will try to schedule her with a different provider.  

## 2018-12-05 ENCOUNTER — Ambulatory Visit: Payer: Medicare Other | Admitting: Gynecologic Oncology

## 2018-12-06 DIAGNOSIS — K589 Irritable bowel syndrome without diarrhea: Secondary | ICD-10-CM | POA: Diagnosis not present

## 2018-12-06 DIAGNOSIS — N3010 Interstitial cystitis (chronic) without hematuria: Secondary | ICD-10-CM | POA: Diagnosis not present

## 2018-12-06 DIAGNOSIS — N9489 Other specified conditions associated with female genital organs and menstrual cycle: Secondary | ICD-10-CM | POA: Diagnosis not present

## 2018-12-06 DIAGNOSIS — R29898 Other symptoms and signs involving the musculoskeletal system: Secondary | ICD-10-CM | POA: Diagnosis not present

## 2018-12-06 DIAGNOSIS — N941 Unspecified dyspareunia: Secondary | ICD-10-CM | POA: Diagnosis not present

## 2018-12-06 DIAGNOSIS — M5386 Other specified dorsopathies, lumbar region: Secondary | ICD-10-CM | POA: Diagnosis not present

## 2018-12-18 DIAGNOSIS — L821 Other seborrheic keratosis: Secondary | ICD-10-CM | POA: Diagnosis not present

## 2018-12-18 DIAGNOSIS — D229 Melanocytic nevi, unspecified: Secondary | ICD-10-CM | POA: Diagnosis not present

## 2018-12-19 ENCOUNTER — Telehealth: Payer: Self-pay | Admitting: *Deleted

## 2018-12-19 NOTE — Telephone Encounter (Signed)
Called and left the patient a message to call the office. Need to schedule an appt

## 2018-12-20 DIAGNOSIS — R29898 Other symptoms and signs involving the musculoskeletal system: Secondary | ICD-10-CM | POA: Diagnosis not present

## 2018-12-20 DIAGNOSIS — K589 Irritable bowel syndrome without diarrhea: Secondary | ICD-10-CM | POA: Diagnosis not present

## 2018-12-20 DIAGNOSIS — N9489 Other specified conditions associated with female genital organs and menstrual cycle: Secondary | ICD-10-CM | POA: Diagnosis not present

## 2018-12-20 DIAGNOSIS — N941 Unspecified dyspareunia: Secondary | ICD-10-CM | POA: Diagnosis not present

## 2018-12-20 DIAGNOSIS — R5382 Chronic fatigue, unspecified: Secondary | ICD-10-CM | POA: Diagnosis not present

## 2018-12-20 DIAGNOSIS — M5386 Other specified dorsopathies, lumbar region: Secondary | ICD-10-CM | POA: Diagnosis not present

## 2018-12-20 DIAGNOSIS — M797 Fibromyalgia: Secondary | ICD-10-CM | POA: Diagnosis not present

## 2018-12-20 DIAGNOSIS — N3010 Interstitial cystitis (chronic) without hematuria: Secondary | ICD-10-CM | POA: Diagnosis not present

## 2018-12-24 ENCOUNTER — Telehealth: Payer: Self-pay | Admitting: *Deleted

## 2018-12-24 NOTE — Telephone Encounter (Signed)
Returned the patient's call and scheduled an appt for 12/2

## 2019-01-15 NOTE — Progress Notes (Signed)
Consult Note: Gyn-Onc  Angel Sellers 58 y.o. female  CC:  No chief complaint on file.   HPI: Patient was seen in consultation at the request of [name]. Primary OB/GYN Dr. [name]  Patient is a very pleasant 58 year old gravida 2 para 2 who underwent hysterectomy 1999 secondary to uterine fibroids. She had an outbreak of interstitial cystitis and for some reason secondary to those symptoms she looked at her perineum with the mirror and saw dark flat freckle. She has never noticed this before and it was not associated with any pruritus. She subsequently underwent a biopsy of the lesion on 12/23/2016.  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 The keratinocytes are basaloid in appearance. Invasive disease is not seen. The lesion stains positive for P16. Sox-10 and MITF highlight melanocytic hyperplasia. In these sections the margins are involved.  We last saw her 12/18 and she underwent a WLE: Diagnosis Vulva, biopsy, right - DERMAL INFLAMMATION AND PIGMENT. - NO DYSPLASIA OR MALIGNANCY.  She has been lost to follow up since then.  She states that starting in December 2019 she started having pain in the area of her wide local excision.  She had made an appointment in early 2020 but then the pandemic had she needed to cancel it.  She states that the pain is intermittent.  It is fleeting it happens about once a week and it lasts just a second.  She feels like it is a nerve pain.  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  She did see Dr. [name] and there were no visible lesions at the time of her exam but she continued to be  concerned and comes in today for follow-up.  She cannot think of any aggravating or alleviating factors.  The pain only happens once a week and it is very fleeting.  She has issues with intercourse and is currently seeing a pelvic rehab specialist but has not mentioned this discomfort to them.  Her dyspareunia is more deep vaginal penetrating than at the introitus.  There is no changes in her bowel habits and there is no pain associated with having a bowel movement.  She herself has no new medical issues.  She recently had a breast biopsy that was benign.  There are no new medical problems in her family.  She is up-to-date on her colonoscopy.  Current Meds:  Outpatient Encounter Medications as of 01/16/2019  Medication Sig  . albuterol (PROVENTIL HFA;VENTOLIN HFA) 108 (90 Base) MCG/ACT inhaler Inhale 2 puffs into the lungs every 6 (six) hours as needed for wheezing or shortness of breath.  Marland Kitchen azithromycin (ZITHROMAX) 250 MG tablet Take 1 tablet (250 mg total) by mouth daily. Take 1 every day until finished.  . Cholecalciferol (VITAMIN D) 2000 units tablet Take 2,000 Units by mouth daily.  . DULoxetine (CYMBALTA) 30 MG capsule Take 90 mg by mouth daily.   . DULoxetine (CYMBALTA) 60 MG capsule Take 90 mg by mouth daily.   . hydrOXYzine (ATARAX/VISTARIL) 50 MG tablet Take by mouth.  Marland Kitchen ketorolac (TORADOL) 10 MG tablet Take 1 tablet (10 mg total) by mouth 3 (three) times daily with meals as needed for moderate pain or severe pain.  Marland Kitchen  levocetirizine (XYZAL) 5 MG tablet Take 1 tablet (5 mg total) by mouth every evening.  Marland Kitchen levothyroxine (SYNTHROID, LEVOTHROID) 112 MCG tablet Take 125 mcg by mouth every other day.   . loratadine (CLARITIN) 10 MG tablet Take 1 tablet (10 mg total) by mouth every morning.  . methocarbamol (ROBAXIN) 500 MG tablet Take 500 mg by mouth 3 (three) times daily as needed (for muscle spasm/pain.).   Marland Kitchen modafinil (PROVIGIL) 100 MG tablet TAKE 1 TABLET BY MOUTH ONCE DAILY IN THE MORNING  FOR 10 DAYS  . mupirocin ointment (BACTROBAN) 2 %   . olmesartan (BENICAR) 5 MG tablet Take 5 mg by mouth daily.   . pantoprazole (PROTONIX) 40 MG tablet   . predniSONE (DELTASONE) 20 MG tablet 2 tabs po daily x 4 days  . promethazine (PHENERGAN) 25 MG tablet Take 25 mg by mouth every 6 (six) hours as needed (for nausea/vomiting with migraines.).   Marland Kitchen ranitidine (ZANTAC) 300 MG capsule Take 1 capsule (300 mg total) by mouth 2 (two) times daily.  . rizatriptan (MAXALT) 10 MG tablet Take 10 mg by mouth daily as needed for migraine. May repeat in 2 hours if needed  . tapentadol (NUCYNTA) 50 MG TABS tablet Take 25 mg by mouth daily as needed (for pain due to exertion/falls).   . topiramate (TOPAMAX) 50 MG tablet Take by mouth.   No facility-administered encounter medications on file as of 01/16/2019.     Allergy:  Allergies  Allergen Reactions  . Other Rash    Derma bond, sunscreen, sun exposure, heat exposure, lotions, shampoos  . Dilaudid [Hydromorphone Hcl] Other (See Comments)    hypotension  . Hydromorphone Other (See Comments)    hypotension hypotension  . Adderall [Amphetamine-Dextroamphetamine] Itching and Rash  . Ciprofloxacin Itching, Rash and Other (See Comments)    Chest tightness/severe vertigo  . Lactose Other (See Comments)    GI upset  . Lactose Intolerance (Gi) Other (See Comments)    GI upset  . Meperidine Nausea And Vomiting and Other (See Comments)    vomiting  . Morphine Hives and Rash    Social Hx:   Social History   Socioeconomic History  . Marital status: Married    Spouse name: Not on file  . Number of children: Not on file  . Years of education: Not on file  . Highest education level: Not on file  Occupational History  . Not on file  Social Needs  . Financial resource strain: Not on file  . Food insecurity    Worry: Not on file    Inability: Not on file  . Transportation needs    Medical: Not on file    Non-medical: Not on file  Tobacco Use   . Smoking status: Never Smoker  . Smokeless tobacco: Never Used  Substance and Sexual Activity  . Alcohol use: No  . Drug use: No  . Sexual activity: Not on file  Lifestyle  . Physical activity    Days per week: Not on file    Minutes per session: Not on file  . Stress: Not on file  Relationships  . Social Herbalist on phone: Not on file    Gets together: Not on file    Attends religious service: Not on file    Active member of club or organization: Not on file    Attends meetings of clubs or organizations: Not on file    Relationship status: Not on file  . Intimate  partner violence    Fear of current or ex partner: Not on file    Emotionally abused: Not on file    Physically abused: Not on file    Forced sexual activity: Not on file  Other Topics Concern  . Not on file  Social History Narrative  . Not on file    Past Surgical Hx:  Past Surgical History:  Procedure Laterality Date  . ABDOMINAL HYSTERECTOMY  1999  . BONE BIOPSY Right 05/01/2013   Procedure: BIOPSY NAIL BED RIGHT THUMB, BIOPSY RIGHT GREAT TOE ;  Surgeon: Wynonia Sours, MD;  Location: [facility];  Service: Orthopedics;  Laterality: Right;  . CESAREAN SECTION     2 c-cestions  . COLONOSCOPY    . CYSTOSCOPY    . DISTAL INTERPHALANGEAL JOINT FUSION Left 02/26/2016   Procedure: LEFT 5TH TOE DISTAL INTERPHALANGEAL JOINT FUSION;  Surgeon: Newt Minion, MD;  Location: [facility];  Service: Orthopedics;  Laterality: Left;  . INCISIONAL HERNIA REPAIR N/A 03/31/2016   Procedure: LAPAROSCOPIC ASSISTED REPAIR OF INCISIONAL HERNIA;  Surgeon: Greer Pickerel, MD;  Location: [facility];  Service: General;  Laterality: N/A;  . INSERTION OF MESH N/A 03/31/2016   Procedure: INSERTION OF MESH;  Surgeon: Greer Pickerel, MD;  Location: [facility];  Service: General;  Laterality: N/A;  . KIDNEY SURGERY  2011   rt partial nephrectomy-[facility]  . TONSILLECTOMY      Past Medical Hx:  Past Medical History:  Diagnosis Date  .  Arthritis   . Asthma    mild obstructive poulmonary disease   . Autoimmune disease ([place])   . Cancer (HCC)    renal  . Chronic fatigue   . Fibromyalgia   . GERD (gastroesophageal reflux disease)   . Headache   . History of kidney cancer   . Hypertension   . Hypothyroidism   . Interstitial cystitis   . Neuromuscular disorder ([place])    nerve  . Norovirus 03/2017  . PONV (postoperative nausea and vomiting)    need scope patch  . POTS (postural orthostatic tachycardia syndrome)   . Recurrent upper respiratory infection (URI)    pneumonia  . Thyroid disease   . Urticaria     Oncology Hx:  Oncology History   No history exists.    Family Hx:  Family History  Problem Relation Age of Onset  . Melanoma Mother   . Kidney cancer Sister   . Kidney cancer Brother     Vitals:  Blood pressure (!) 151/94, pulse (!) 126, temperature 97.6 F (36.4 C), temperature source Temporal, resp. rate 19, height '5\' 5"'$  (1.651 m), weight 196 lb 3.2 oz (89 kg), SpO2 98 %.  Physical Exam: Well-nourished well-developed female in no acute distress.  Pelvic external genitalia unremarkable.  I am not able to elicit the pain she has on the right inferior labia majora.  The entire vulva was inspected.  She does have 2 very small round flat freckles that do not appear hyperkeratotic or irregular in any fashion.  She has 1 to the right and left of the anus.  The each measure approximately 3 mm.  She has a small external hemorrhoid.  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  She does not appear to have a recurrence of her dysplasia.  She comes in today for fleeting pain that happens once a week in the area of the excision.  I discussed doing incisional  massage as this may help should there be a little bit of a nerve irritation from the procedure.  I also encouraged her to discuss this with her pelvic rehab specialist who she is seeing  tomorrow.  With regards to the 2 small freckles they look like the myriad of freckles she has elsewhere.  She did show me a picture of the lesion that was removed 2 years ago.  Different than the lesion she currently has that lesion was irregular in its border and it was not completely smooth and round.  The small freckle she has are completely smooth and round and do not appear concerning.  She is able to do vulvar self exams and I encouraged her if she notices any change in these to please contact our clinic or Dr. [name].  She knows that we will be happy to see her in the future should the need arise.  I appreciate the opportunity to partner in the care of this very pleasant patient.  Aleck Locklin A., MD 01/16/2019, 9:49 AM

## 2019-01-16 ENCOUNTER — Encounter: Payer: Self-pay | Admitting: Gynecologic Oncology

## 2019-01-16 ENCOUNTER — Inpatient Hospital Stay: Payer: 59 | Attending: Gynecologic Oncology | Admitting: Gynecologic Oncology

## 2019-01-16 ENCOUNTER — Other Ambulatory Visit: Payer: Self-pay

## 2019-01-16 VITALS — BP 151/94 | HR 126 | Temp 97.6°F | Resp 19 | Ht 65.0 in | Wt 196.2 lb

## 2019-01-16 DIAGNOSIS — E039 Hypothyroidism, unspecified: Secondary | ICD-10-CM | POA: Diagnosis not present

## 2019-01-16 DIAGNOSIS — D071 Carcinoma in situ of vulva: Secondary | ICD-10-CM | POA: Diagnosis not present

## 2019-01-16 DIAGNOSIS — Z79899 Other long term (current) drug therapy: Secondary | ICD-10-CM | POA: Insufficient documentation

## 2019-01-16 DIAGNOSIS — R102 Pelvic and perineal pain: Secondary | ICD-10-CM | POA: Diagnosis not present

## 2019-01-16 DIAGNOSIS — Z85528 Personal history of other malignant neoplasm of kidney: Secondary | ICD-10-CM | POA: Insufficient documentation

## 2019-01-16 DIAGNOSIS — M1990 Unspecified osteoarthritis, unspecified site: Secondary | ICD-10-CM | POA: Diagnosis not present

## 2019-01-16 DIAGNOSIS — Z90710 Acquired absence of both cervix and uterus: Secondary | ICD-10-CM | POA: Diagnosis not present

## 2019-01-16 DIAGNOSIS — Z905 Acquired absence of kidney: Secondary | ICD-10-CM | POA: Diagnosis not present

## 2019-01-16 DIAGNOSIS — K219 Gastro-esophageal reflux disease without esophagitis: Secondary | ICD-10-CM | POA: Diagnosis not present

## 2019-01-16 DIAGNOSIS — Z791 Long term (current) use of non-steroidal anti-inflammatories (NSAID): Secondary | ICD-10-CM | POA: Insufficient documentation

## 2019-01-16 DIAGNOSIS — N941 Unspecified dyspareunia: Secondary | ICD-10-CM | POA: Diagnosis not present

## 2019-01-16 DIAGNOSIS — M797 Fibromyalgia: Secondary | ICD-10-CM | POA: Diagnosis not present

## 2019-01-16 DIAGNOSIS — J449 Chronic obstructive pulmonary disease, unspecified: Secondary | ICD-10-CM | POA: Diagnosis not present

## 2019-01-16 DIAGNOSIS — I10 Essential (primary) hypertension: Secondary | ICD-10-CM | POA: Insufficient documentation

## 2019-01-16 NOTE — Patient Instructions (Signed)
It was good to see you today.  Please proceed with massage and discussed this with your pelvic rehab providers tomorrow.  Please let me know if we can be of any assistance to you in the future.  Happy holidays

## 2019-01-17 DIAGNOSIS — M5386 Other specified dorsopathies, lumbar region: Secondary | ICD-10-CM | POA: Diagnosis not present

## 2019-01-17 DIAGNOSIS — M797 Fibromyalgia: Secondary | ICD-10-CM | POA: Diagnosis not present

## 2019-01-17 DIAGNOSIS — N9489 Other specified conditions associated with female genital organs and menstrual cycle: Secondary | ICD-10-CM | POA: Diagnosis not present

## 2019-01-17 DIAGNOSIS — R29898 Other symptoms and signs involving the musculoskeletal system: Secondary | ICD-10-CM | POA: Diagnosis not present

## 2019-01-17 DIAGNOSIS — R5382 Chronic fatigue, unspecified: Secondary | ICD-10-CM | POA: Diagnosis not present

## 2019-01-17 DIAGNOSIS — N941 Unspecified dyspareunia: Secondary | ICD-10-CM | POA: Diagnosis not present

## 2019-01-17 DIAGNOSIS — K589 Irritable bowel syndrome without diarrhea: Secondary | ICD-10-CM | POA: Diagnosis not present

## 2019-01-17 DIAGNOSIS — N3010 Interstitial cystitis (chronic) without hematuria: Secondary | ICD-10-CM | POA: Diagnosis not present

## 2019-01-23 DIAGNOSIS — E039 Hypothyroidism, unspecified: Secondary | ICD-10-CM | POA: Diagnosis not present

## 2019-01-28 DIAGNOSIS — K219 Gastro-esophageal reflux disease without esophagitis: Secondary | ICD-10-CM | POA: Diagnosis not present

## 2019-02-20 DIAGNOSIS — Z9889 Other specified postprocedural states: Secondary | ICD-10-CM | POA: Diagnosis not present

## 2019-02-20 DIAGNOSIS — G43709 Chronic migraine without aura, not intractable, without status migrainosus: Secondary | ICD-10-CM | POA: Diagnosis not present

## 2019-02-20 DIAGNOSIS — Z85528 Personal history of other malignant neoplasm of kidney: Secondary | ICD-10-CM | POA: Diagnosis not present

## 2019-02-20 DIAGNOSIS — E669 Obesity, unspecified: Secondary | ICD-10-CM | POA: Diagnosis not present

## 2019-02-20 DIAGNOSIS — R1110 Vomiting, unspecified: Secondary | ICD-10-CM | POA: Diagnosis not present

## 2019-02-20 DIAGNOSIS — Z8719 Personal history of other diseases of the digestive system: Secondary | ICD-10-CM | POA: Diagnosis not present

## 2019-02-21 DIAGNOSIS — K219 Gastro-esophageal reflux disease without esophagitis: Secondary | ICD-10-CM | POA: Diagnosis not present

## 2019-02-21 DIAGNOSIS — E039 Hypothyroidism, unspecified: Secondary | ICD-10-CM | POA: Diagnosis not present

## 2019-02-21 DIAGNOSIS — Z6832 Body mass index (BMI) 32.0-32.9, adult: Secondary | ICD-10-CM | POA: Diagnosis not present

## 2019-02-21 DIAGNOSIS — E669 Obesity, unspecified: Secondary | ICD-10-CM | POA: Diagnosis not present

## 2019-02-22 ENCOUNTER — Other Ambulatory Visit: Payer: Self-pay | Admitting: General Surgery

## 2019-02-22 DIAGNOSIS — R1110 Vomiting, unspecified: Secondary | ICD-10-CM

## 2019-02-27 ENCOUNTER — Ambulatory Visit
Admission: RE | Admit: 2019-02-27 | Discharge: 2019-02-27 | Disposition: A | Discharge status: Home / Self Care | Payer: 59 | Source: Ambulatory Visit | Attending: General Surgery | Admitting: General Surgery

## 2019-02-27 DIAGNOSIS — K224 Dyskinesia of esophagus: Secondary | ICD-10-CM | POA: Diagnosis not present

## 2019-02-27 DIAGNOSIS — R1110 Vomiting, unspecified: Secondary | ICD-10-CM

## 2019-03-07 DIAGNOSIS — E039 Hypothyroidism, unspecified: Secondary | ICD-10-CM | POA: Diagnosis not present

## 2019-03-08 DIAGNOSIS — M797 Fibromyalgia: Secondary | ICD-10-CM | POA: Diagnosis not present

## 2019-03-08 DIAGNOSIS — N3010 Interstitial cystitis (chronic) without hematuria: Secondary | ICD-10-CM | POA: Diagnosis not present

## 2019-03-08 DIAGNOSIS — R5382 Chronic fatigue, unspecified: Secondary | ICD-10-CM | POA: Diagnosis not present

## 2019-03-08 DIAGNOSIS — K589 Irritable bowel syndrome without diarrhea: Secondary | ICD-10-CM | POA: Diagnosis not present

## 2019-03-11 DIAGNOSIS — E039 Hypothyroidism, unspecified: Secondary | ICD-10-CM | POA: Diagnosis not present

## 2019-03-18 ENCOUNTER — Ambulatory Visit: Payer: 59 | Attending: Internal Medicine

## 2019-03-18 DIAGNOSIS — Z20822 Contact with and (suspected) exposure to covid-19: Secondary | ICD-10-CM

## 2019-03-19 LAB — NOVEL CORONAVIRUS, NAA: SARS-CoV-2, NAA: NOT DETECTED

## 2019-03-20 DIAGNOSIS — E039 Hypothyroidism, unspecified: Secondary | ICD-10-CM | POA: Diagnosis not present

## 2019-03-20 DIAGNOSIS — K219 Gastro-esophageal reflux disease without esophagitis: Secondary | ICD-10-CM | POA: Diagnosis not present

## 2019-03-20 DIAGNOSIS — E669 Obesity, unspecified: Secondary | ICD-10-CM | POA: Diagnosis not present

## 2019-03-20 DIAGNOSIS — I10 Essential (primary) hypertension: Secondary | ICD-10-CM | POA: Diagnosis not present

## 2019-03-20 DIAGNOSIS — J4520 Mild intermittent asthma, uncomplicated: Secondary | ICD-10-CM | POA: Diagnosis not present

## 2019-03-20 DIAGNOSIS — Z01810 Encounter for preprocedural cardiovascular examination: Secondary | ICD-10-CM | POA: Diagnosis not present

## 2019-03-20 DIAGNOSIS — Z6832 Body mass index (BMI) 32.0-32.9, adult: Secondary | ICD-10-CM | POA: Diagnosis not present

## 2019-03-29 ENCOUNTER — Other Ambulatory Visit (HOSPITAL_COMMUNITY): Payer: 59

## 2019-04-05 ENCOUNTER — Other Ambulatory Visit: Payer: Self-pay

## 2019-04-05 ENCOUNTER — Other Ambulatory Visit (HOSPITAL_COMMUNITY)
Admission: RE | Admit: 2019-04-05 | Discharge: 2019-04-05 | Disposition: A | Discharge status: Home / Self Care | Payer: 59 | Source: Ambulatory Visit | Attending: Internal Medicine | Admitting: Internal Medicine

## 2019-04-05 DIAGNOSIS — Z20822 Contact with and (suspected) exposure to covid-19: Secondary | ICD-10-CM | POA: Diagnosis not present

## 2019-04-05 DIAGNOSIS — Z01812 Encounter for preprocedural laboratory examination: Secondary | ICD-10-CM | POA: Insufficient documentation

## 2019-04-05 LAB — SARS CORONAVIRUS 2 (TAT 6-24 HRS): SARS Coronavirus 2: NEGATIVE

## 2019-04-09 ENCOUNTER — Other Ambulatory Visit: Payer: Self-pay

## 2019-04-09 ENCOUNTER — Other Ambulatory Visit: Payer: Self-pay | Admitting: Internal Medicine

## 2019-04-09 ENCOUNTER — Encounter (INDEPENDENT_AMBULATORY_CARE_PROVIDER_SITE_OTHER): Payer: 59

## 2019-04-09 DIAGNOSIS — Z01810 Encounter for preprocedural cardiovascular examination: Secondary | ICD-10-CM

## 2019-04-09 LAB — EXERCISE TOLERANCE TEST
Estimated workload: 6.7 METS
Exercise duration (min): 3 min
Exercise duration (sec): 54 s
MPHR: 162 {beats}/min
Peak HR: 148 {beats}/min
Percent HR: 91 %
RPE: 15
Rest HR: 113 {beats}/min

## 2019-04-12 DIAGNOSIS — B370 Candidal stomatitis: Secondary | ICD-10-CM | POA: Diagnosis not present

## 2019-04-12 DIAGNOSIS — M797 Fibromyalgia: Secondary | ICD-10-CM | POA: Diagnosis not present

## 2019-04-12 DIAGNOSIS — J4520 Mild intermittent asthma, uncomplicated: Secondary | ICD-10-CM | POA: Diagnosis not present

## 2019-04-16 DIAGNOSIS — E669 Obesity, unspecified: Secondary | ICD-10-CM | POA: Diagnosis not present

## 2019-04-16 DIAGNOSIS — E039 Hypothyroidism, unspecified: Secondary | ICD-10-CM | POA: Diagnosis not present

## 2019-05-08 DIAGNOSIS — Z9884 Bariatric surgery status: Secondary | ICD-10-CM | POA: Diagnosis not present

## 2019-05-08 DIAGNOSIS — I10 Essential (primary) hypertension: Secondary | ICD-10-CM | POA: Diagnosis not present

## 2019-05-28 DIAGNOSIS — N281 Cyst of kidney, acquired: Secondary | ICD-10-CM | POA: Diagnosis not present

## 2019-05-28 DIAGNOSIS — C641 Malignant neoplasm of right kidney, except renal pelvis: Secondary | ICD-10-CM | POA: Diagnosis not present

## 2019-05-28 DIAGNOSIS — N3010 Interstitial cystitis (chronic) without hematuria: Secondary | ICD-10-CM | POA: Diagnosis not present

## 2019-06-04 DIAGNOSIS — J019 Acute sinusitis, unspecified: Secondary | ICD-10-CM | POA: Diagnosis not present

## 2019-06-04 DIAGNOSIS — B9689 Other specified bacterial agents as the cause of diseases classified elsewhere: Secondary | ICD-10-CM | POA: Diagnosis not present

## 2019-06-04 DIAGNOSIS — Z03818 Encounter for observation for suspected exposure to other biological agents ruled out: Secondary | ICD-10-CM | POA: Diagnosis not present

## 2019-06-04 DIAGNOSIS — R05 Cough: Secondary | ICD-10-CM | POA: Diagnosis not present

## 2019-06-04 DIAGNOSIS — J029 Acute pharyngitis, unspecified: Secondary | ICD-10-CM | POA: Diagnosis not present

## 2019-06-04 DIAGNOSIS — Z1152 Encounter for screening for COVID-19: Secondary | ICD-10-CM | POA: Diagnosis not present

## 2019-06-04 DIAGNOSIS — R509 Fever, unspecified: Secondary | ICD-10-CM | POA: Diagnosis not present

## 2019-06-04 DIAGNOSIS — R0981 Nasal congestion: Secondary | ICD-10-CM | POA: Diagnosis not present

## 2019-06-10 DIAGNOSIS — B9689 Other specified bacterial agents as the cause of diseases classified elsewhere: Secondary | ICD-10-CM | POA: Diagnosis not present

## 2019-06-10 DIAGNOSIS — J019 Acute sinusitis, unspecified: Secondary | ICD-10-CM | POA: Diagnosis not present

## 2019-06-10 DIAGNOSIS — J40 Bronchitis, not specified as acute or chronic: Secondary | ICD-10-CM | POA: Diagnosis not present

## 2019-06-13 ENCOUNTER — Other Ambulatory Visit: Payer: Self-pay | Admitting: Obstetrics & Gynecology

## 2019-06-13 DIAGNOSIS — R921 Mammographic calcification found on diagnostic imaging of breast: Secondary | ICD-10-CM

## 2019-06-19 DIAGNOSIS — G43009 Migraine without aura, not intractable, without status migrainosus: Secondary | ICD-10-CM | POA: Diagnosis not present

## 2019-06-19 DIAGNOSIS — E039 Hypothyroidism, unspecified: Secondary | ICD-10-CM | POA: Diagnosis not present

## 2019-06-25 DIAGNOSIS — I10 Essential (primary) hypertension: Secondary | ICD-10-CM | POA: Diagnosis not present

## 2019-06-25 DIAGNOSIS — E559 Vitamin D deficiency, unspecified: Secondary | ICD-10-CM | POA: Diagnosis not present

## 2019-06-25 DIAGNOSIS — Z9884 Bariatric surgery status: Secondary | ICD-10-CM | POA: Diagnosis not present

## 2019-07-03 ENCOUNTER — Other Ambulatory Visit: Payer: Self-pay

## 2019-07-03 ENCOUNTER — Ambulatory Visit: Payer: 59

## 2019-07-03 ENCOUNTER — Ambulatory Visit
Admission: RE | Admit: 2019-07-03 | Discharge: 2019-07-03 | Disposition: A | Discharge status: Home / Self Care | Payer: 59 | Source: Ambulatory Visit | Attending: Obstetrics & Gynecology | Admitting: Obstetrics & Gynecology

## 2019-07-03 DIAGNOSIS — R921 Mammographic calcification found on diagnostic imaging of breast: Secondary | ICD-10-CM

## 2019-07-03 DIAGNOSIS — R928 Other abnormal and inconclusive findings on diagnostic imaging of breast: Secondary | ICD-10-CM | POA: Diagnosis not present

## 2019-08-14 DIAGNOSIS — E039 Hypothyroidism, unspecified: Secondary | ICD-10-CM | POA: Diagnosis not present

## 2019-08-16 DIAGNOSIS — L659 Nonscarring hair loss, unspecified: Secondary | ICD-10-CM | POA: Diagnosis not present

## 2019-09-25 DIAGNOSIS — E039 Hypothyroidism, unspecified: Secondary | ICD-10-CM | POA: Diagnosis not present

## 2019-09-30 ENCOUNTER — Other Ambulatory Visit: Payer: Self-pay | Admitting: Obstetrics & Gynecology

## 2019-09-30 DIAGNOSIS — Z1231 Encounter for screening mammogram for malignant neoplasm of breast: Secondary | ICD-10-CM

## 2019-10-04 DIAGNOSIS — K8020 Calculus of gallbladder without cholecystitis without obstruction: Secondary | ICD-10-CM | POA: Diagnosis not present

## 2019-10-04 DIAGNOSIS — K760 Fatty (change of) liver, not elsewhere classified: Secondary | ICD-10-CM | POA: Diagnosis not present

## 2019-10-23 ENCOUNTER — Ambulatory Visit
Admission: RE | Admit: 2019-10-23 | Discharge: 2019-10-23 | Disposition: A | Discharge status: Home / Self Care | Payer: 59 | Source: Ambulatory Visit | Attending: Obstetrics & Gynecology | Admitting: Obstetrics & Gynecology

## 2019-10-23 ENCOUNTER — Other Ambulatory Visit: Payer: Self-pay

## 2019-10-23 DIAGNOSIS — Z23 Encounter for immunization: Secondary | ICD-10-CM | POA: Diagnosis not present

## 2019-10-23 DIAGNOSIS — Z1231 Encounter for screening mammogram for malignant neoplasm of breast: Secondary | ICD-10-CM

## 2019-10-30 ENCOUNTER — Ambulatory Visit: Payer: Self-pay | Admitting: General Surgery

## 2019-10-30 DIAGNOSIS — E663 Overweight: Secondary | ICD-10-CM | POA: Diagnosis not present

## 2019-10-30 DIAGNOSIS — K760 Fatty (change of) liver, not elsewhere classified: Secondary | ICD-10-CM | POA: Diagnosis not present

## 2019-10-30 DIAGNOSIS — Z8719 Personal history of other diseases of the digestive system: Secondary | ICD-10-CM | POA: Diagnosis not present

## 2019-10-30 DIAGNOSIS — K8020 Calculus of gallbladder without cholecystitis without obstruction: Secondary | ICD-10-CM | POA: Diagnosis not present

## 2019-10-30 DIAGNOSIS — Z9884 Bariatric surgery status: Secondary | ICD-10-CM | POA: Diagnosis not present

## 2019-10-30 NOTE — H&P (Signed)
Angel Sellers Appointment: 10/30/2019 9:00 AM Location: [facility] Patient #: [id number] DOB: Jul 09, 1960 Married / Language: English / Race: White Female  History of Present Illness Angel Hiss M. [name] MD; 10/30/2019 9:37 AM) The patient is a 59 year old female who presents for evaluation of gallbladder disease. She comes in to discuss some abdominal pain she has been having intermittently over the past few months. She ended up finding and undergoing cashed pay laparoscopic sleeve gastrectomy on April 27, 2019 and [place]. I do not have a copy of that operative note. She states that she lost about 40 pounds. She states that it went well. She states her heartburn has resolved. She states surgeon did have to deal with some scar tissue from the prior mesh. She states that she's been having intermittent episodes of epigastric and right upper quadrant pain radiating to her back. This episodes last from anywhere from 1-3 hours. It is associated with nausea. She had a severe episode in June that lasted 5 hours and it almost caused her to go the emergency room. She states it is generally food related. She denies any fever, chills, vomiting, diarrhea or constipation. She ended up having an ultrasound which showed cholelithiasis, some hepatic steatosis. She had a cystic area in her liver measuring 2.6 x 2.3 x 3 cm in the anterior right lobe. There is some hepatic steatosis.   Problem List/Past Medical Angel Hiss M. 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 Redmond Pulling, MD; 10/30/2019 9:38 AM) Breast Biopsy Left. Cesarean Section -  Multiple Foot Surgery Left. Hysterectomy (not due to cancer) - Complete Mammoplasty; Reduction Bilateral. Tonsillectomy  Diagnostic Studies History Angel Hiss M. Redmond Pulling, MD; 10/30/2019 9:37 AM) Colonoscopy 1-5 years ago Mammogram within last year Pap Smear 1-5 years ago  Allergies (Angel Sellers, RMA; 10/30/2019 8:59 AM) Dilaudid *ANALGESICS - OPIOID* Ciprofloxacin *CHEMICALS* Itching, Rash. Lactose Intolerance *DIGESTIVE AIDS* Meperidine HCl *ANALGESICS - OPIOID* Nausea, Vomiting. Morphine Sulfate (Concentrate) *ANALGESICS - OPIOID* Hives, Rash. Doxycycline *DERMATOLOGICALS* Adderall *ADHD/ANTI-NARCOLEPSY/ANTI-OBESITY/ANOREXIANTS* Dermabond Allergies Reconciled  Medication History (Angel Sellers, [place]; 10/30/2019 9:01 AM) DULoxetine HCl (60MG  Capsule DR Part, Oral) Active. Levothyroxine Sodium (75MCG Tablet, Oral) Active. Olmesartan Medoxomil (5MG  Tablet, Oral) Active. DULoxetine HCl (30MG  Capsule DR Part, Oral) Active. Claritin (10MG  Tablet, Oral) Active. Omeprazole (40MG  Capsule DR, Oral) Active. Medications Reconciled  Social History Angel Hiss M. Redmond Pulling, MD; 10/30/2019 9:38 AM) No alcohol use No caffeine use No drug use Tobacco use Never smoker.  Family History Angel Hiss M. Redmond Pulling, MD; 10/30/2019 9:38 AM) Cancer Brother, Sister. Hypertension Brother, Father, Mother, Sister. Melanoma Mother. Migraine Headache Daughter, Father, Mother, Sister, Son. Thyroid problems Daughter, Father, Mother, Sister. Colon Polyps Brother, Father, Mother. Heart Disease Father. Ischemic Bowel Disease Brother.  Pregnancy / Birth History Angel Hiss M. Redmond Pulling, MD; 10/30/2019 9:38 AM) Age at menarche 70 years, 14 years. Age of menopause <45 Gravida 2 Length (months) of breastfeeding 3-6 Maternal age 59-25 Para 2 Irregular periods  Other Problems Angel Hiss M. Redmond Pulling, MD; 10/30/2019 9:38 AM) Arthritis Asthma Bladder Problems Cancer Gastroesophageal Reflux  Disease High blood pressure Melanoma Migraine Headache Thyroid Disease Umbilical Hernia Repair Other disease, cancer, significant illness    Vitals (Angel Sellers RMA; 10/30/2019 9:01 AM) 10/30/2019 9:01 AM Weight: 154.8 lb Height: 65in Body  Surface Area: 1.77 m Body Mass Index: 25.76 kg/m  Temp.: 97.58F  Pulse: 98 (Regular)  BP: 132/84(Sitting, Left Arm, Standard)        Assessment & Plan Angel Hiss M. Anglia Blakley MD; 10/30/2019 9:37 AM)  HISTORY OF INCISIONAL HERNIA REPAIR [phone]) Impression: Status post laparoscopic assisted ventral incisional hernia repair 2018; primary muscle repair with mesh underlay, bard ventralexST 8.4 cm; we did rediscuss her prior abdominal hernia surgery. We discussed that she would need to alert the surgeons in [facility] about this history. I told her that she may have some adhesions but hopefully they would be mild omental adhesions. She was given her copy of her operative note in case she decides to proceed with weight loss surgery in [place] (E66.3) Impression: had lap sleeve gastrectomy in [place] on 04/27/19 - cash pay; congratulated her on her weight loss. Discussed the importance of taking multivitamin calcium supplementation   SYMPTOMATIC CHOLELITHIASIS (K80.20) Impression: I believe the patient's symptoms are consistent with gallbladder disease.  We discussed gallbladder disease. The patient was given Neurosurgeon. We discussed non-operative and operative management. We discussed the signs & symptoms of acute cholecystitis  I discussed laparoscopic cholecystectomy with IOC in detail. The patient was given educational material as well as diagrams detailing the procedure. We discussed the risks and benefits of a laparoscopic cholecystectomy including, but not limited to bleeding, infection, injury to surrounding structures such as the intestine or liver, bile leak, retained gallstones, need to convert to an  open procedure, prolonged diarrhea, blood clots such as DVT, common bile duct injury, anesthesia risks, and possible need for additional procedures. We discussed the typical post-operative recovery course. I explained that the likelihood of improvement of their symptoms is good.  The patient has elected to proceed with surgery.  Given her prior abdominal surgeries we did discuss that she may have adhesions which could increase her risk for enterotomy/complications. She will attempt to get me a copy of her operative note from [place] since her surgeon did indicate he had to take down some of the adhesions  This patient encounter took 25 minutes today to perform the following: take history, perform exam, review outside records, interpret imaging, counsel the patient on their diagnosis and document encounter, findings & plan in the EHR  Current Plans Pt Education - Pamphlet Given - Laparoscopic Gallbladder Surgery: discussed with patient and provided information. You are being scheduled for surgery- Our schedulers will call you.  You should hear from our office's scheduling department within 5 working days about the location, date, and time of surgery. We try to make accommodations for patient's preferences in scheduling surgery, but sometimes the OR schedule or the surgeon's schedule prevents [place] from making those accommodations.  If you have not heard from our office [phone]) in 5 working days, call the office and ask for your surgeon's nurse.  If you have other questions about your diagnosis, plan, or surgery, call the office and ask for your surgeon's nurse.   S/P LAPAROSCOPIC SLEEVE GASTRECTOMY (Z98.84) Story: in [place], 04/27/19   HEPATIC STEATOSIS (K76.0) Impression: She did inquire about her hepatic steatosis. We did discuss potential of liver biopsy. She states that she thinks her liver function tests are normal. She does also have liver cyst in the right lobe. I  recommended that if her preoperative LFTs are abnormal then we should consider intraoperative liver biopsy. She states that she would be fine with that. We did discuss risk with a liver  biopsy such as bleeding, bile leak, nondiagnostic biopsy. We will make a final decision based on her preoperative blood work  Leighton Ruff. Redmond Pulling, MD, FACS General, Bariatric, & Minimally Invasive Surgery [facility], [place]

## 2019-12-06 ENCOUNTER — Other Ambulatory Visit: Payer: Self-pay

## 2019-12-06 ENCOUNTER — Encounter (HOSPITAL_COMMUNITY): Payer: Self-pay

## 2019-12-06 ENCOUNTER — Encounter (HOSPITAL_COMMUNITY)
Admission: RE | Admit: 2019-12-06 | Discharge: 2019-12-06 | Disposition: A | Discharge status: Home / Self Care | Payer: 59 | Source: Ambulatory Visit | Attending: General Surgery | Admitting: General Surgery

## 2019-12-06 DIAGNOSIS — Z01818 Encounter for other preprocedural examination: Secondary | ICD-10-CM | POA: Insufficient documentation

## 2019-12-06 DIAGNOSIS — I10 Essential (primary) hypertension: Secondary | ICD-10-CM | POA: Insufficient documentation

## 2019-12-06 HISTORY — DX: Pneumonia, unspecified organism: J18.9

## 2019-12-06 LAB — CBC WITH DIFFERENTIAL/PLATELET
Abs Immature Granulocytes: 0.01 10*3/uL (ref 0.00–0.07)
Basophils Absolute: 0.1 10*3/uL (ref 0.0–0.1)
Basophils Relative: 1 %
Eosinophils Absolute: 0.2 10*3/uL (ref 0.0–0.5)
Eosinophils Relative: 2 %
HCT: 44.3 % (ref 36.0–46.0)
Hemoglobin: 14.3 g/dL (ref 12.0–15.0)
Immature Granulocytes: 0 %
Lymphocytes Relative: 32 %
Lymphs Abs: 2.4 10*3/uL (ref 0.7–4.0)
MCH: 28.3 pg (ref 26.0–34.0)
MCHC: 32.3 g/dL (ref 30.0–36.0)
MCV: 87.7 fL (ref 80.0–100.0)
Monocytes Absolute: 0.6 10*3/uL (ref 0.1–1.0)
Monocytes Relative: 8 %
Neutro Abs: 4.4 10*3/uL (ref 1.7–7.7)
Neutrophils Relative %: 57 %
Platelets: 266 10*3/uL (ref 150–400)
RBC: 5.05 MIL/uL (ref 3.87–5.11)
RDW: 12.9 % (ref 11.5–15.5)
WBC: 7.6 10*3/uL (ref 4.0–10.5)
nRBC: 0 % (ref 0.0–0.2)

## 2019-12-06 LAB — COMPREHENSIVE METABOLIC PANEL
ALT: 14 U/L (ref 0–44)
AST: 17 U/L (ref 15–41)
Albumin: 4.3 g/dL (ref 3.5–5.0)
Alkaline Phosphatase: 68 U/L (ref 38–126)
Anion gap: 10 (ref 5–15)
BUN: 18 mg/dL (ref 6–20)
CO2: 27 mmol/L (ref 22–32)
Calcium: 9.4 mg/dL (ref 8.9–10.3)
Chloride: 102 mmol/L (ref 98–111)
Creatinine, Ser: 0.91 mg/dL (ref 0.44–1.00)
GFR, Estimated: >60 mL/min (ref >60)
Glucose, Bld: 96 mg/dL (ref 70–99)
Potassium: 4.1 mmol/L (ref 3.5–5.1)
Sodium: 139 mmol/L (ref 135–145)
Total Bilirubin: 0.6 mg/dL (ref 0.3–1.2)
Total Protein: 7.3 g/dL (ref 6.5–8.1)

## 2019-12-06 NOTE — Pre-Procedure Instructions (Signed)
Angel Sellers  12/06/2019    Your procedure is scheduled on Friday, December 13, 2019 at 11:30 AM.   Report to [facility] Entrance "A" Admitting Office at 9:30 AM.   Call this number if you have problems the morning of surgery: [phone]   Questions prior to day of surgery, please call [phone] between 8 & 4 PM.   Remember:  Do not eat food after midnight Thursday, 12/12/19  You may drink clear liquids until 8:30 AM.  Clear liquids allowed are: Water, Juice (non-citric and without pulp - diabetics please choose diet or no sugar options), Carbonated beverages - (diabetics please choose diet or no sugar options), Clear Tea, Black Coffee only (no creamer, milk or cream including half and half), Plain Jell-O only (diabetics please choose diet or no sugar options), Gatorade (diabetics please choose diet or no sugar options) and Plain Popsicles only    Take these medicines the morning of surgery with A SIP OF WATER: Duloxetine (Cymbalta), Levothyroxine (Synthroid), Loratadine (Claritin), Omeprazole (Prilosec), Methocarbamol (Robaxin)- if needed, Ondansetron (Zofran) - if needed, Tapentadol (Nucynta) - if needed, Nasacort inhaler, Albuterol inhaler (Ventolin) - if needed (bring this inhaler with you day of surgery)  Stop Vitamins, Herbal medications and NSAIDS (Toradol, Ketorolac, Ibuprofen, Aleve, etc) as of today prior to surgery. Do not use Aspirin containing products or Fish Oil prior to surgery.    Do not wear jewelry, make-up or nail polish.  Do not wear lotions, powders, perfumes or deodorant.  Do not shave 48 hours prior to surgery.   Do not bring valuables to the hospital.  [facility] is not responsible for any belongings or valuables.  Contacts, dentures or bridgework may not be worn into surgery.  Leave your suitcase in the car.  After surgery it may be brought to your room.  For patients admitted to the hospital, discharge time will be determined by your  treatment team.  Patients discharged the day of surgery will not be allowed to drive home.   [place] - Preparing for Surgery  Before surgery, you can play an important role.  Because skin is not sterile, your skin needs to be as free of germs as possible.  You can reduce the number of germs on you skin by washing with CHG (chlorahexidine gluconate) soap before surgery.  CHG is an antiseptic cleaner which kills germs and bonds with the skin to continue killing germs even after washing.  Oral Hygiene is also important in reducing the risk of infection.  Remember to brush your teeth with your regular toothpaste the morning of surgery.  Please DO NOT use if you have an allergy to CHG or antibacterial soaps.  If your skin becomes reddened/irritated stop using the CHG and inform your nurse when you arrive at Short Stay.  Do not shave (including legs and underarms) for at least 48 hours prior to the first CHG shower.  You may shave your face.  Please follow these instructions carefully:   1.  Shower with CHG Soap the night before surgery and the morning of Surgery.  2.  If you choose to wash your hair, wash your hair first as usual with your normal shampoo.  3.  After you shampoo, rinse your hair and body thoroughly to remove the shampoo. 4.  Use CHG as you would any other liquid soap.  You can apply chg directly to the skin and wash gently with a      scrungie or washcloth.  5.  Apply the CHG Soap to your body ONLY FROM THE NECK DOWN.   Do not use on open wounds or open sores. Avoid contact with your eyes, ears, mouth and genitals (private parts).  Wash genitals (private parts) with your normal soap - do this prior to using CHG soap.  6.  Wash thoroughly, paying special attention to the area where your surgery will be performed.  7.  Thoroughly rinse your body with warm water from the neck down.  8.  DO NOT shower/wash with your normal soap after using and rinsing off the CHG Soap.  9.   Pat yourself dry with a clean towel.            10.  Wear clean pajamas.            11.  Place clean sheets on your bed the night of your first shower and do not sleep with pets.  Day of Surgery  Shower as above. Do not apply any lotions/deodorants the morning of surgery.   Please wear clean clothes to the hospital. Remember to brush your teeth with toothpaste.  Please read over the fact sheets that you were given.

## 2019-12-06 NOTE — Progress Notes (Signed)
PCP - Dr. Lavone Orn  EKG - today Stress Test - 04/09/19   Sleep Study - "years ago" - negative CPAP -   ERAS Protcol - per Anesthesia protocol   COVID TEST- scheduled for 12/10/19  Anesthesia review: autoimmune hx - pt states she has been told that something is going on with her liver, will be having a liver biopsy during surgery  Patient denies shortness of breath, fever, cough and chest pain at PAT appointment   All instructions explained to the patient, with a verbal understanding of the material. Patient agrees to go over the instructions while at home for a better understanding. Patient also instructed to self quarantine after being tested for COVID-19. The opportunity to ask questions was provided.

## 2019-12-09 DIAGNOSIS — J029 Acute pharyngitis, unspecified: Secondary | ICD-10-CM | POA: Diagnosis not present

## 2019-12-10 ENCOUNTER — Other Ambulatory Visit (HOSPITAL_COMMUNITY)
Admission: RE | Admit: 2019-12-10 | Discharge: 2019-12-10 | Disposition: A | Discharge status: Home / Self Care | Payer: 59 | Source: Ambulatory Visit | Attending: General Surgery | Admitting: General Surgery

## 2019-12-10 DIAGNOSIS — Z01812 Encounter for preprocedural laboratory examination: Secondary | ICD-10-CM | POA: Diagnosis not present

## 2019-12-10 DIAGNOSIS — Z20822 Contact with and (suspected) exposure to covid-19: Secondary | ICD-10-CM | POA: Insufficient documentation

## 2019-12-10 LAB — SARS CORONAVIRUS 2 (TAT 6-24 HRS): SARS Coronavirus 2: NEGATIVE

## 2019-12-13 ENCOUNTER — Encounter (HOSPITAL_COMMUNITY): Payer: Self-pay | Admitting: General Surgery

## 2019-12-13 ENCOUNTER — Ambulatory Visit (HOSPITAL_COMMUNITY): Payer: 59 | Admitting: Vascular Surgery

## 2019-12-13 ENCOUNTER — Ambulatory Visit (HOSPITAL_COMMUNITY)
Admission: RE | Admit: 2019-12-13 | Discharge: 2019-12-13 | Disposition: A | Discharge status: Home / Self Care | Payer: 59 | Source: Ambulatory Visit | Attending: General Surgery | Admitting: General Surgery

## 2019-12-13 ENCOUNTER — Ambulatory Visit (HOSPITAL_COMMUNITY): Payer: 59

## 2019-12-13 ENCOUNTER — Ambulatory Visit (HOSPITAL_COMMUNITY): Payer: 59 | Admitting: Certified Registered Nurse Anesthetist

## 2019-12-13 ENCOUNTER — Other Ambulatory Visit: Payer: Self-pay

## 2019-12-13 ENCOUNTER — Encounter (HOSPITAL_COMMUNITY)
Admission: RE | Disposition: A | Discharge status: Home / Self Care | Payer: Self-pay | Source: Ambulatory Visit | Attending: General Surgery

## 2019-12-13 DIAGNOSIS — Z9884 Bariatric surgery status: Secondary | ICD-10-CM | POA: Diagnosis not present

## 2019-12-13 DIAGNOSIS — Z885 Allergy status to narcotic agent status: Secondary | ICD-10-CM | POA: Diagnosis not present

## 2019-12-13 DIAGNOSIS — K8010 Calculus of gallbladder with chronic cholecystitis without obstruction: Secondary | ICD-10-CM | POA: Diagnosis not present

## 2019-12-13 DIAGNOSIS — I10 Essential (primary) hypertension: Secondary | ICD-10-CM | POA: Diagnosis not present

## 2019-12-13 DIAGNOSIS — Z888 Allergy status to other drugs, medicaments and biological substances status: Secondary | ICD-10-CM | POA: Insufficient documentation

## 2019-12-13 DIAGNOSIS — Z881 Allergy status to other antibiotic agents status: Secondary | ICD-10-CM | POA: Insufficient documentation

## 2019-12-13 DIAGNOSIS — Z419 Encounter for procedure for purposes other than remedying health state, unspecified: Secondary | ICD-10-CM

## 2019-12-13 DIAGNOSIS — E876 Hypokalemia: Secondary | ICD-10-CM | POA: Diagnosis not present

## 2019-12-13 DIAGNOSIS — A419 Sepsis, unspecified organism: Secondary | ICD-10-CM | POA: Diagnosis not present

## 2019-12-13 DIAGNOSIS — K8020 Calculus of gallbladder without cholecystitis without obstruction: Secondary | ICD-10-CM | POA: Diagnosis not present

## 2019-12-13 HISTORY — PX: CHOLECYSTECTOMY: SHX55

## 2019-12-13 SURGERY — LAPAROSCOPIC CHOLECYSTECTOMY WITH INTRAOPERATIVE CHOLANGIOGRAM
Anesthesia: General | Site: Abdomen

## 2019-12-13 MED ORDER — PROMETHAZINE HCL 25 MG/ML IJ SOLN
6.2500 mg | INTRAMUSCULAR | Status: DC | PRN
  Administered 2019-12-13: 6.25 mg via INTRAVENOUS

## 2019-12-13 MED ORDER — HYDROMORPHONE HCL 1 MG/ML IJ SOLN: 0.2500 mg | INTRAMUSCULAR | Status: DC | PRN

## 2019-12-13 MED ORDER — PROPOFOL 10 MG/ML IV BOLUS
INTRAVENOUS | Status: DC | PRN
  Administered 2019-12-13: 20 mg via INTRAVENOUS
  Administered 2019-12-13: 150 mg via INTRAVENOUS
  Administered 2019-12-13: 30 mg via INTRAVENOUS

## 2019-12-13 MED ORDER — PROMETHAZINE HCL 25 MG/ML IJ SOLN
INTRAMUSCULAR | Status: AC
  Filled 2019-12-13: qty 1

## 2019-12-13 MED ORDER — ORAL CARE MOUTH RINSE: 15.0000 mL | Freq: Once | OROMUCOSAL | Status: AC

## 2019-12-13 MED ORDER — DEXAMETHASONE SODIUM PHOSPHATE 10 MG/ML IJ SOLN
INTRAMUSCULAR | Status: DC | PRN
  Administered 2019-12-13: 10 mg via INTRAVENOUS

## 2019-12-13 MED ORDER — ACETAMINOPHEN 500 MG PO TABS
1000.0000 mg | ORAL_TABLET | ORAL | Status: DC
  Filled 2019-12-13: qty 2

## 2019-12-13 MED ORDER — FENTANYL CITRATE (PF) 250 MCG/5ML IJ SOLN
INTRAMUSCULAR | Status: DC | PRN
  Administered 2019-12-13: 75 ug via INTRAVENOUS
  Administered 2019-12-13: 50 ug via INTRAVENOUS
  Administered 2019-12-13: 25 ug via INTRAVENOUS
  Administered 2019-12-13 (×2): 50 ug via INTRAVENOUS

## 2019-12-13 MED ORDER — ONDANSETRON HCL 4 MG/2ML IJ SOLN
INTRAMUSCULAR | Status: AC
  Filled 2019-12-13: qty 2

## 2019-12-13 MED ORDER — MIDAZOLAM HCL 2 MG/2ML IJ SOLN
INTRAMUSCULAR | Status: AC
  Filled 2019-12-13: qty 2

## 2019-12-13 MED ORDER — 0.9 % SODIUM CHLORIDE (POUR BTL) OPTIME
TOPICAL | Status: DC | PRN
  Administered 2019-12-13: 1000 mL

## 2019-12-13 MED ORDER — CHLORHEXIDINE GLUCONATE CLOTH 2 % EX PADS: 6.0000 | MEDICATED_PAD | Freq: Once | CUTANEOUS | Status: DC

## 2019-12-13 MED ORDER — PHENYLEPHRINE 40 MCG/ML (10ML) SYRINGE FOR IV PUSH (FOR BLOOD PRESSURE SUPPORT)
PREFILLED_SYRINGE | INTRAVENOUS | Status: DC | PRN
  Administered 2019-12-13: 160 ug via INTRAVENOUS
  Administered 2019-12-13: 80 ug via INTRAVENOUS
  Administered 2019-12-13: 160 ug via INTRAVENOUS

## 2019-12-13 MED ORDER — PROPOFOL 10 MG/ML IV BOLUS
INTRAVENOUS | Status: AC
  Filled 2019-12-13: qty 20

## 2019-12-13 MED ORDER — ROCURONIUM BROMIDE 10 MG/ML (PF) SYRINGE
PREFILLED_SYRINGE | INTRAVENOUS | Status: DC | PRN
  Administered 2019-12-13: 60 mg via INTRAVENOUS

## 2019-12-13 MED ORDER — ROCURONIUM BROMIDE 10 MG/ML (PF) SYRINGE
PREFILLED_SYRINGE | INTRAVENOUS | Status: AC
  Filled 2019-12-13: qty 10

## 2019-12-13 MED ORDER — MIDAZOLAM HCL 2 MG/2ML IJ SOLN
INTRAMUSCULAR | Status: DC | PRN
  Administered 2019-12-13: 2 mg via INTRAVENOUS

## 2019-12-13 MED ORDER — KETOROLAC TROMETHAMINE 30 MG/ML IJ SOLN
INTRAMUSCULAR | Status: AC
  Filled 2019-12-13: qty 1

## 2019-12-13 MED ORDER — HEMOSTATIC AGENTS (NO CHARGE) OPTIME
TOPICAL | Status: DC | PRN
  Administered 2019-12-13: 1 via TOPICAL

## 2019-12-13 MED ORDER — SCOPOLAMINE 1 MG/3DAYS TD PT72
MEDICATED_PATCH | TRANSDERMAL | Status: AC
  Filled 2019-12-13: qty 1

## 2019-12-13 MED ORDER — BUPIVACAINE-EPINEPHRINE (PF) 0.25% -1:200000 IJ SOLN
INTRAMUSCULAR | Status: AC
  Filled 2019-12-13: qty 30

## 2019-12-13 MED ORDER — PHENYLEPHRINE 40 MCG/ML (10ML) SYRINGE FOR IV PUSH (FOR BLOOD PRESSURE SUPPORT)
PREFILLED_SYRINGE | INTRAVENOUS | Status: AC
  Filled 2019-12-13: qty 10

## 2019-12-13 MED ORDER — ONDANSETRON HCL 4 MG/2ML IJ SOLN
INTRAMUSCULAR | Status: DC | PRN
  Administered 2019-12-13: 4 mg via INTRAVENOUS

## 2019-12-13 MED ORDER — DEXAMETHASONE SODIUM PHOSPHATE 10 MG/ML IJ SOLN
INTRAMUSCULAR | Status: AC
  Filled 2019-12-13: qty 1

## 2019-12-13 MED ORDER — BUPIVACAINE-EPINEPHRINE 0.25% -1:200000 IJ SOLN
INTRAMUSCULAR | Status: DC | PRN
  Administered 2019-12-13: 20 mL

## 2019-12-13 MED ORDER — CHLORHEXIDINE GLUCONATE 0.12 % MT SOLN
15.0000 mL | Freq: Once | OROMUCOSAL | Status: AC
  Administered 2019-12-13: 15 mL via OROMUCOSAL
  Filled 2019-12-13: qty 15

## 2019-12-13 MED ORDER — LIDOCAINE 2% (20 MG/ML) 5 ML SYRINGE
INTRAMUSCULAR | Status: AC
  Filled 2019-12-13: qty 5

## 2019-12-13 MED ORDER — FENTANYL CITRATE (PF) 250 MCG/5ML IJ SOLN
INTRAMUSCULAR | Status: AC
  Filled 2019-12-13: qty 5

## 2019-12-13 MED ORDER — LACTATED RINGERS IV SOLN: INTRAVENOUS | Status: DC

## 2019-12-13 MED ORDER — SUGAMMADEX SODIUM 200 MG/2ML IV SOLN
INTRAVENOUS | Status: DC | PRN
  Administered 2019-12-13: 200 mg via INTRAVENOUS

## 2019-12-13 MED ORDER — KETOROLAC TROMETHAMINE 30 MG/ML IJ SOLN
30.0000 mg | Freq: Once | INTRAMUSCULAR | Status: AC | PRN
  Administered 2019-12-13: 30 mg via INTRAVENOUS

## 2019-12-13 MED ORDER — LIDOCAINE 2% (20 MG/ML) 5 ML SYRINGE
INTRAMUSCULAR | Status: DC | PRN
  Administered 2019-12-13: 100 mg via INTRAVENOUS

## 2019-12-13 MED ORDER — SODIUM CHLORIDE 0.9 % IV SOLN: INTRAVENOUS | Status: DC | PRN

## 2019-12-13 MED ORDER — SODIUM CHLORIDE 0.9 % IV SOLN
2.0000 g | INTRAVENOUS | Status: AC
  Administered 2019-12-13: 2 g via INTRAVENOUS
  Filled 2019-12-13: qty 2

## 2019-12-13 SURGICAL SUPPLY — 54 items
APPLIER CLIP 5 13 M/L LIGAMAX5 (MISCELLANEOUS) ×3
BLADE CLIPPER SURG (BLADE) IMPLANT
CANISTER SUCT 3000ML PPV (MISCELLANEOUS) ×3 IMPLANT
CHLORAPREP W/TINT 26 (MISCELLANEOUS) ×3 IMPLANT
CLIP APPLIE 5 13 M/L LIGAMAX5 (MISCELLANEOUS) ×2 IMPLANT
CLOSURE STERI-STRIP 1/4X4 (GAUZE/BANDAGES/DRESSINGS) ×3 IMPLANT
COVER MAYO STAND STRL (DRAPES) ×3 IMPLANT
COVER SURGICAL LIGHT HANDLE (MISCELLANEOUS) ×3 IMPLANT
COVER WAND RF STERILE (DRAPES) ×3 IMPLANT
DRAPE C-ARM 42X120 X-RAY (DRAPES) ×3 IMPLANT
DRSG TEGADERM 2-3/8X2-3/4 SM (GAUZE/BANDAGES/DRESSINGS) ×9 IMPLANT
DRSG TEGADERM 4X10 (GAUZE/BANDAGES/DRESSINGS) ×3 IMPLANT
DRSG TEGADERM 4X4.75 (GAUZE/BANDAGES/DRESSINGS) ×3 IMPLANT
ELECT REM PT RETURN 9FT ADLT (ELECTROSURGICAL) ×3
ELECTRODE REM PT RTRN 9FT ADLT (ELECTROSURGICAL) ×2 IMPLANT
GAUZE SPONGE 2X2 8PLY STRL LF (GAUZE/BANDAGES/DRESSINGS) ×2 IMPLANT
GLOVE BIO SURGEON STRL SZ 6.5 (GLOVE) ×3 IMPLANT
GLOVE BIOGEL M STRL SZ7.5 (GLOVE) ×3 IMPLANT
GLOVE BIOGEL PI IND STRL 6.5 (GLOVE) ×2 IMPLANT
GLOVE BIOGEL PI INDICATOR 6.5 (GLOVE) ×1
GLOVE INDICATOR 8.0 STRL GRN (GLOVE) ×6 IMPLANT
GOWN STRL REUS W/ TWL LRG LVL3 (GOWN DISPOSABLE) ×6 IMPLANT
GOWN STRL REUS W/TWL 2XL LVL3 (GOWN DISPOSABLE) ×3 IMPLANT
GOWN STRL REUS W/TWL LRG LVL3 (GOWN DISPOSABLE) ×9
GRASPER SUT TROCAR 14GX15 (MISCELLANEOUS) ×3 IMPLANT
HEMOSTAT SNOW SURGICEL 2X4 (HEMOSTASIS) ×3 IMPLANT
IV CATH 14GX2 1/4 (CATHETERS) ×3 IMPLANT
KIT BASIN OR (CUSTOM PROCEDURE TRAY) ×3 IMPLANT
KIT TURNOVER KIT B (KITS) ×3 IMPLANT
NS IRRIG 1000ML POUR BTL (IV SOLUTION) ×3 IMPLANT
PAD ARMBOARD 7.5X6 YLW CONV (MISCELLANEOUS) ×3 IMPLANT
POUCH RETRIEVAL ECOSAC 10 (ENDOMECHANICALS) ×2 IMPLANT
POUCH RETRIEVAL ECOSAC 10MM (ENDOMECHANICALS) ×3
SCISSORS LAP 5X35 DISP (ENDOMECHANICALS) ×3 IMPLANT
SET CHOLANGIOGRAPH 5 50 .035 (SET/KITS/TRAYS/PACK) IMPLANT
SET CHOLANGIOGRAPHY FRANKLIN (SET/KITS/TRAYS/PACK) ×3 IMPLANT
SET IRRIG TUBING LAPAROSCOPIC (IRRIGATION / IRRIGATOR) ×3 IMPLANT
SET TUBE SMOKE EVAC HIGH FLOW (TUBING) ×3 IMPLANT
SLEEVE ENDOPATH XCEL 5M (ENDOMECHANICALS) ×6 IMPLANT
SPECIMEN JAR SMALL (MISCELLANEOUS) ×3 IMPLANT
SPONGE GAUZE 2X2 8PLY STRL LF (GAUZE/BANDAGES/DRESSINGS) ×3 IMPLANT
SPONGE GAUZE 2X2 STER 10/PKG (GAUZE/BANDAGES/DRESSINGS) ×1
STOPCOCK 4 WAY LG BORE MALE ST (IV SETS) ×3 IMPLANT
STRIP CLOSURE SKIN 1/2X4 (GAUZE/BANDAGES/DRESSINGS) ×3 IMPLANT
SUT MNCRL AB 4-0 PS2 18 (SUTURE) ×3 IMPLANT
SUT VIC AB 0 UR5 27 (SUTURE) ×3 IMPLANT
SUT VICRYL 0 UR6 27IN ABS (SUTURE) IMPLANT
TOWEL GREEN STERILE (TOWEL DISPOSABLE) ×3 IMPLANT
TOWEL GREEN STERILE FF (TOWEL DISPOSABLE) ×3 IMPLANT
TRAY LAPAROSCOPIC MC (CUSTOM PROCEDURE TRAY) ×3 IMPLANT
TROCAR XCEL BLUNT TIP 100MML (ENDOMECHANICALS) IMPLANT
TROCAR XCEL NON-BLD 11X100MML (ENDOMECHANICALS) ×3 IMPLANT
TROCAR XCEL NON-BLD 5MMX100MML (ENDOMECHANICALS) ×3 IMPLANT
WATER STERILE IRR 1000ML POUR (IV SOLUTION) ×3 IMPLANT

## 2019-12-13 NOTE — Anesthesia Postprocedure Evaluation (Signed)
Anesthesia Post Note  Patient: Angel Sellers  Procedure(s) Performed: LAPAROSCOPIC CHOLECYSTECTOMY WITH INTRAOPERATIVE CHOLANGIOGRAM (N/A Abdomen)     Patient location during evaluation: PACU Anesthesia Type: General Level of consciousness: awake and alert Pain management: pain level controlled Vital Signs Assessment: post-procedure vital signs reviewed and stable Respiratory status: spontaneous breathing, nonlabored ventilation, respiratory function stable and patient connected to nasal cannula oxygen Cardiovascular status: blood pressure returned to baseline and stable Postop Assessment: no apparent nausea or vomiting Anesthetic complications: no   No complications documented.  Last Vitals:  Vitals:   12/13/19 1253 12/13/19 1323  BP: (!) 145/83 (!) 147/83  Pulse: 97 88  Resp: 15 11  Temp:  36.4 C  SpO2: 96% 98%    Last Pain:  Vitals:   12/13/19 1323  TempSrc:   PainSc: 0-No pain                 Verleen Stuckey S

## 2019-12-13 NOTE — Anesthesia Procedure Notes (Signed)
Procedure Name: Intubation Date/Time: 12/13/2019 11:11 AM Performed by: Verdie Drown, CRNA Pre-anesthesia Checklist: Patient identified, Emergency Drugs available, Suction available and Patient being monitored Patient Re-evaluated:Patient Re-evaluated prior to induction Oxygen Delivery Method: Circle System Utilized Preoxygenation: Pre-oxygenation with 100% oxygen Induction Type: IV induction Ventilation: Mask ventilation without difficulty Laryngoscope Size: Mac and 3 Grade View: Grade II Tube type: Oral Tube size: 7.0 mm Number of attempts: 1 Airway Equipment and Method: Stylet and Oral airway Placement Confirmation: ETT inserted through vocal cords under direct vision,  positive ETCO2 and breath sounds checked- equal and bilateral Secured at: 21 cm Tube secured with: Tape Dental Injury: Teeth and Oropharynx as per pre-operative assessment

## 2019-12-13 NOTE — Transfer of Care (Signed)
Immediate Anesthesia Transfer of Care Note  Patient: Angel Sellers  Procedure(s) Performed: LAPAROSCOPIC CHOLECYSTECTOMY WITH INTRAOPERATIVE CHOLANGIOGRAM (N/A Abdomen)  Patient Location: PACU  Anesthesia Type:General  Level of Consciousness: patient cooperative and responds to stimulation  Airway & Oxygen Therapy: Patient Spontanous Breathing and Patient connected to nasal cannula oxygen  Post-op Assessment: Report given to RN and Post -op Vital signs reviewed and stable  Post vital signs: Reviewed and stable  Last Vitals:  Vitals Value Taken Time  BP 151/80 12/13/19 1238  Temp    Pulse 83 12/13/19 1243  Resp 11 12/13/19 1243  SpO2 100 % 12/13/19 1243  Vitals shown include unvalidated device data.  Last Pain:  Vitals:   12/13/19 1238  TempSrc:   PainSc: (P) Asleep      Patients Stated Pain Goal: 4 (83/29/19 1660)  Complications: No complications documented.

## 2019-12-13 NOTE — Anesthesia Preprocedure Evaluation (Signed)
Anesthesia Evaluation  Patient identified by MRN, date of birth, ID band Patient awake    Reviewed: Allergy & Precautions, NPO status , Patient's Chart, lab work & pertinent test results  History of Anesthesia Complications (+) PONV  Airway Mallampati: II  TM Distance: >3 FB Neck ROM: Full    Dental no notable dental hx.    Pulmonary neg pulmonary ROS,    Pulmonary exam normal breath sounds clear to auscultation       Cardiovascular hypertension, Normal cardiovascular exam Rhythm:Regular Rate:Normal     Neuro/Psych negative neurological ROS  negative psych ROS   GI/Hepatic Neg liver ROS, GERD  ,  Endo/Other  Hypothyroidism   Renal/GU negative Renal ROS  negative genitourinary   Musculoskeletal negative musculoskeletal ROS (+)   Abdominal   Peds negative pediatric ROS (+)  Hematology negative hematology ROS (+)   Anesthesia Other Findings   Reproductive/Obstetrics negative OB ROS                             Anesthesia Physical Anesthesia Plan  ASA: II  Anesthesia Plan: General   Post-op Pain Management:    Induction: Intravenous  PONV Risk Score and Plan: 4 or greater and Ondansetron, Dexamethasone, Midazolam, Scopolamine patch - Pre-op and Treatment may vary due to age or medical condition  Airway Management Planned: Oral ETT  Additional Equipment:   Intra-op Plan:   Post-operative Plan: Extubation in OR  Informed Consent: I have reviewed the patients History and Physical, chart, labs and discussed the procedure including the risks, benefits and alternatives for the proposed anesthesia with the patient or authorized representative who has indicated his/her understanding and acceptance.     Dental advisory given  Plan Discussed with: CRNA and Surgeon  Anesthesia Plan Comments:         Anesthesia Quick Evaluation

## 2019-12-13 NOTE — Op Note (Addendum)
Laparoscopic Cholecystectomy with IOC Procedure Note  Indications: This patient presents with symptomatic gallbladder disease and will undergo laparoscopic cholecystectomy.  Pre-operative Diagnosis:  symptomatic Cholelithiasis  Post-operative Diagnosis: Same  Surgeon: Greer Pickerel, MD FACS  Assistants: Ahmed Prima, MD [facility] -surgery resident  Anesthesia: General endotracheal anesthesia  ASA Class: 2  Procedure Details  The patient was seen again in the Holding Room. The risks, benefits, complications, treatment options, and expected outcomes were discussed with the patient. The possibilities of reaction to medication, pulmonary aspiration, perforation of viscus, bleeding, recurrent infection, finding a normal gallbladder, the need for additional procedures, failure to diagnose a condition, the possible need to convert to an open procedure, and creating a complication requiring transfusion or operation were discussed with the patient. The likelihood of improving the patient's symptoms with return to their baseline status is good.  The patient and/or family concurred with the proposed plan, giving informed consent. The site of surgery properly noted. The patient was taken to Operating Room, identified as Angel Sellers and the procedure verified as Laparoscopic Cholecystectomy with Intraoperative Cholangiogram. A Time Out was held and the above information confirmed.  Prior to the induction of general anesthesia, antibiotic prophylaxis was administered. General endotracheal anesthesia was then administered and tolerated well. After the induction, the abdomen was prepped with Chloraprep and draped in the sterile fashion. The patient was positioned in the supine position.  The attending surgeon entered the abdomen with a right upper quadrant optiview trochar. The right was chosen as she had a prior sleeve gastrectomy and  supraumbilical hernia repair with mesh, so this was the likely safest  entry point. Pneumoperitoneum was then created with CO2 and tolerated well without any adverse changes in the patient's vital signs. An 11-mm port was placed in the subxiphoid position. One 5-mm port was placed inferior to the umbilicus and mesh. Two 5-mm ports were placed in the right upper quadrant. All skin incisions were infiltrated with a local anesthetic agent before making the incision and placing the trocars. There was omentum adhered to the superior abdomen/mesh, medial to the 107mm trochar which was left alone  We positioned the patient in reverse Trendelenburg, tilted slightly to the patient's left.  The gallbladder was identified, the fundus grasped and retracted cephalad by the attending. Adhesions were lysed bluntly and with the electrocautery where indicated, taking care not to injure any adjacent organs or viscus. The infundibulum was grasped and retracted laterally, exposing the peritoneum overlying the triangle of Calot. This was then divided and exposed in a blunt fashion. A critical view of the cystic duct and cystic artery was obtained.  The cystic duct was clearly identified and bluntly dissected circumferentially. The cystic duct was ligated with a clip distally.   An incision was made in the cystic duct and the cholangiogram catheter introduced. The catheter was secured using a clip. A cholangiogram was then obtained which showed good visualization of the distal and proximal biliary tree with no sign of filling defects or obstruction.  Contrast flowed easily into the duodenum. The catheter was then removed.   The cystic duct was then ligated with clips and divided. The cystic artery which had been identified & dissected free beforehand, was ligated with clips and divided as well.   The gallbladder was dissected from the liver bed in retrograde fashion with the electrocautery. One posterior cystic branch was encountered and a single clip was placed on the stay side, and hook  electrocautery was used to divide the  artery. The gallbladder was removed and placed in an Eccoh sac. The liver bed was irrigated and inspected. Hemostasis was achieved with the electrocautery and a piece of Surgicel snow. Copious irrigation was utilized and was repeatedly aspirated until clear.  The gallbladder and Ecco sac were then removed through the subxiphoid port site.  The suture passer was used to place an interrupted 0-vicryl suture.  We again inspected the right upper quadrant for hemostasis.  Pneumoperitoneum was released as we removed the trocars.  4-0 Monocryl was used to close the skin.   Benzoin, steri-strips, and clean dressings were applied. The patient was then extubated and brought to the recovery room in stable condition. Instrument, sponge, and needle counts were correct at closure and at the conclusion of the case. Dr. Redmond Pulling was present for all portions of the case except skin closure  Findings: Cholelithiasis Normal IOC  Estimated Blood Loss: Minimal         Drains: none         Specimens: Gallbladder           Complications: None; patient tolerated the procedure well.         Disposition: PACU - hemodynamically stable.         Condition: stable  Ahmed Prima, MD [facility] PGY 5  General Surgery  I was present for all portions of the surgery except skin closure and I was immediately available throughout the enitre procedure.  I have reviewed/edited and agree with the operative note as documented by the resident.  Leighton Ruff. Redmond Pulling, MD, FACS General, Bariatric, & Minimally Invasive Surgery [facility], [place]

## 2019-12-13 NOTE — Discharge Instructions (Signed)
Angel Sellers, P.A. LAPAROSCOPIC SURGERY: POST OP INSTRUCTIONS Always review your discharge instruction sheet given to you by the facility where your surgery was performed. IF YOU HAVE DISABILITY OR FAMILY LEAVE FORMS, YOU MUST BRING THEM TO THE OFFICE FOR PROCESSING.   DO NOT GIVE THEM TO YOUR DOCTOR.  PAIN CONTROL  1. First take acetaminophen (Tylenol) AND/or ibuprofen (Advil) to control your pain after surgery.  Follow directions on package.  Taking acetaminophen (Tylenol) and/or ibuprofen (Advil) regularly after surgery will help to control your pain and lower the amount of prescription pain medication you may need.  You should not take more than 3,000 mg (3 grams) of acetaminophen (Tylenol) in 24 hours.  You should not take ibuprofen (Advil), aleve, motrin, naprosyn or other NSAIDS if you have a history of stomach ulcers or chronic kidney disease.  2. A prescription for pain medication may be given to you upon discharge.  Take your pain medication as prescribed, if you still have uncontrolled pain after taking acetaminophen (Tylenol) or ibuprofen (Advil). 3. Use ice packs to help control pain. 4. If you need a refill on your pain medication, please contact your pharmacy.  They will contact our office to request authorization. Prescriptions will not be filled after 5pm or on week-ends.  HOME MEDICATIONS 5. Take your usually prescribed medications unless otherwise directed.  DIET 6. You should follow a light diet the first few days after arrival home.  Be sure to include lots of fluids daily. Avoid fatty, fried foods.   CONSTIPATION 7. It is common to experience some constipation after surgery and if you are taking pain medication.  Increasing fluid intake and taking a stool softener (such as Colace) will usually help or prevent this problem from occurring.  A mild laxative (Milk of Magnesia or Miralax) should be taken according to package instructions if there are no bowel  movements after 48 hours.  WOUND/INCISION CARE 8. Most patients will experience some swelling and bruising in the area of the incisions.  Ice packs will help.  Swelling and bruising can take several days to resolve.  9. Unless discharge instructions indicate otherwise, follow guidelines below  a. STERI-STRIPS - you may remove your outer bandages 48 hours after surgery, and you may shower at that time.  You have steri-strips (small skin tapes) in place directly over the incision.  These strips should be left on the skin for 7-10 days.   b. DERMABOND/SKIN GLUE - you may shower in 24 hours.  The glue will flake off over the next 2-3 weeks. 10. Any sutures or staples will be removed at the office during your follow-up visit.  ACTIVITIES 11. You may resume regular (light) daily activities beginning the next day--such as daily self-care, walking, climbing stairs--gradually increasing activities as tolerated.  You may have sexual intercourse when it is comfortable.  Refrain from any heavy lifting or straining until approved by your doctor. a. You may drive when you are no longer taking prescription pain medication, you can comfortably wear a seatbelt, and you can safely maneuver your car and apply brakes.  FOLLOW-UP 12. You should see your doctor in the office for a follow-up appointment approximately 2-3 weeks after your surgery.  You should have been given your post-op/follow-up appointment when your surgery was scheduled.  If you did not receive a post-op/follow-up appointment, make sure that you call for this appointment within a day or two after you arrive home to insure a convenient appointment time.  OTHER  INSTRUCTIONS 13. Do not lift anything greater than 15lbs for 2 weeks  WHEN TO CALL YOUR DOCTOR: 1. Fever over 101.0 2. Inability to urinate 3. Continued bleeding from incision. 4. Increased pain, redness, or drainage from the incision. 5. Increasing abdominal pain  The clinic staff is  available to answer your questions during regular business hours.  Please don't hesitate to call and ask to speak to one of the nurses for clinical concerns.  If you have a medical emergency, go to the nearest emergency room or call 911.  A surgeon from [facility] is always on call at the hospital. [address] [place], [place], [place]  [address] ? [address], [place], [place]   [address] [phone] ? [phone] ? FAX [phone] Web site: [email or web address]

## 2019-12-13 NOTE — H&P (Signed)
.  CC: here for surgery  Requesting provider: n/a  HPI: Angel Sellers is an 59 y.o. female who is here for lap cholecystectomy poss ioc. Denies medical changes since seen in clinic. No trips to ed. No f/c/n/v/cp/sob.   The patient is a 59 year old female who presents for evaluation of gallbladder disease. She comes in to discuss some abdominal pain she has been having intermittently over the past few months. She ended up finding and undergoing cashed pay laparoscopic sleeve gastrectomy on April 27, 2019 and [place]. I do not have a copy of that operative note. She states that she lost about 50 pounds. She states that it went well. She states her heartburn has resolved. She states surgeon did have to deal with some scar tissue from the prior mesh. She states that she's been having intermittent episodes of epigastric and right upper quadrant pain radiating to her back. This episodes last from anywhere from 1-3 hours. It is associated with nausea. She had a severe episode in June that lasted 5 hours and it almost caused her to go the emergency room. She states it is generally food related. She denies any fever, chills, vomiting, diarrhea or constipation. She ended up having an ultrasound which showed cholelithiasis, some hepatic steatosis. She had a cystic area in her liver measuring 2.6 x 2.3 x 3 cm in the anterior right lobe. There is some hepatic steatosis.  Past Medical History:  Diagnosis Date  . Arthritis   . Asthma    mild obstructive poulmonary disease   . Autoimmune disease ([place])   . Cancer ([facility])    renal  . Chronic fatigue   . Fibromyalgia   . GERD (gastroesophageal reflux disease)   . Headache    migraines  . History of kidney cancer   . Hypertension   . Hypothyroidism   . Interstitial cystitis   . Neuromuscular disorder ([place])    nerve  . Norovirus 03/2017  . Pneumonia   . PONV (postoperative nausea and vomiting)    need scope patch  . POTS  (postural orthostatic tachycardia syndrome)   . Recurrent upper respiratory infection (URI)    pneumonia  . Thyroid disease   . Urticaria     Past Surgical History:  Procedure Laterality Date  . ABDOMINAL HYSTERECTOMY  1999  . BONE BIOPSY Right 05/01/2013   Procedure: BIOPSY NAIL BED RIGHT THUMB, BIOPSY RIGHT GREAT TOE ;  Surgeon: Wynonia Sours, MD;  Location: [facility];  Service: Orthopedics;  Laterality: Right;  . BREAST BIOPSY Left   . CESAREAN SECTION     2 c-cestions  . COLONOSCOPY    . Crowns    . CYSTOSCOPY    . DISTAL INTERPHALANGEAL JOINT FUSION Left 02/26/2016   Procedure: LEFT 5TH TOE DISTAL INTERPHALANGEAL JOINT FUSION;  Surgeon: Newt Minion, MD;  Location: [facility];  Service: Orthopedics;  Laterality: Left;  Marland Kitchen GASTRECTOMY     weight loss  . INCISIONAL HERNIA REPAIR N/A 03/31/2016   Procedure: LAPAROSCOPIC ASSISTED REPAIR OF INCISIONAL HERNIA;  Surgeon: Greer Pickerel, MD;  Location: [facility];  Service: General;  Laterality: N/A;  . INSERTION OF MESH N/A 03/31/2016   Procedure: INSERTION OF MESH;  Surgeon: Greer Pickerel, MD;  Location: [facility];  Service: General;  Laterality: N/A;  . KIDNEY SURGERY  2011   rt partial nephrectomy-[facility]  . REDUCTION MAMMAPLASTY Bilateral 2016  . TONSILLECTOMY      Family History  Problem Relation Age  of Onset  . Melanoma Mother   . Kidney cancer Sister   . Kidney cancer Brother     Social:  reports that she has never smoked. She has never used smokeless tobacco. She reports that she does not drink alcohol and does not use drugs.  Allergies:  Allergies  Allergen Reactions  . Other Rash    Derma bond, sunscreen, sun exposure, heat exposure, lotions, shampoos  . Doxycycline Hives and Itching  . Hydromorphone Other (See Comments)    hypotension   . Adderall [Amphetamine-Dextroamphetamine] Itching and Rash  . Ciprofloxacin Itching, Rash and Other (See Comments)    Chest tightness/severe vertigo  . Lactose Intolerance (Gi)  Other (See Comments)    GI upset  . Meperidine Nausea And Vomiting and Other (See Comments)    vomiting  . Morphine Hives and Rash    Medications: I have reviewed the patient's current medications.  No results found for this or any previous visit (from the past 48 hour(s)).  No results found.  ROS - all of the below systems have been reviewed with the patient and positives are indicated with bold text General: chills, fever or night sweats Eyes: blurry vision or double vision ENT: epistaxis or sore throat Allergy/Immunology: itchy/watery eyes or nasal congestion Hematologic/Lymphatic: bleeding problems, blood clots or swollen lymph nodes Endocrine: temperature intolerance or unexpected weight changes Breast: new or changing breast lumps or nipple discharge Resp: cough, shortness of breath, or wheezing CV: chest pain or dyspnea on exertion GI: as per HPI GU: dysuria, trouble voiding, or hematuria MSK: joint pain or joint stiffness Neuro: TIA or stroke symptoms Derm: pruritus and skin lesion changes Psych: anxiety and depression  PE Blood pressure 140/89, pulse 82, temperature 98.5 F (36.9 C), temperature source Oral, resp. rate 18, height 5' 5.5" (1.664 m), weight 69 kg, SpO2 98 %. Constitutional: NAD; conversant; no deformities Eyes: Moist conjunctiva; no lid lag; anicteric; PERRL Neck: Trachea midline; no thyromegaly Lungs: Normal respiratory effort; no tactile fremitus CV: RRR; no palpable thrills; no pitting edema GI: Abd soft, nt, old incisions; no palpable hepatosplenomegaly MSK: Normal gait; no clubbing/cyanosis Psychiatric: Appropriate affect; alert and oriented x3 Lymphatic: No palpable cervical or axillary lymphadenopathy Skin:no rash/lesions  No results found for this or any previous visit (from the past 43 hour(s)).  No results found.  Imaging:   A/P: Angel Sellers is an 58 y.o. female with  Symptomatic cholelithiasis Prior lap sleeve gastrectomy  at [facility] Prior lap VHR hepatitic steatosis   Plan lap chole with poss ioc Since preop LFTs are normal - will not do liver bx Eras protocol Informed/discussed pt of resident's involvement with her case  Leighton Ruff. Redmond Pulling, MD, FACS General, Bariatric, & Minimally Invasive Surgery [facility], [place]

## 2019-12-14 ENCOUNTER — Encounter (HOSPITAL_COMMUNITY): Payer: Self-pay | Admitting: General Surgery

## 2019-12-15 IMAGING — CR DG CHEST 2V
2 series · 2 of 2 positions shown · non-contrast
Comparison: Chest radiograph 04/04/2017

CLINICAL DATA: Patient with cough for 1 week.

EXAM:
CHEST - 2 VIEW

[w chest pa]
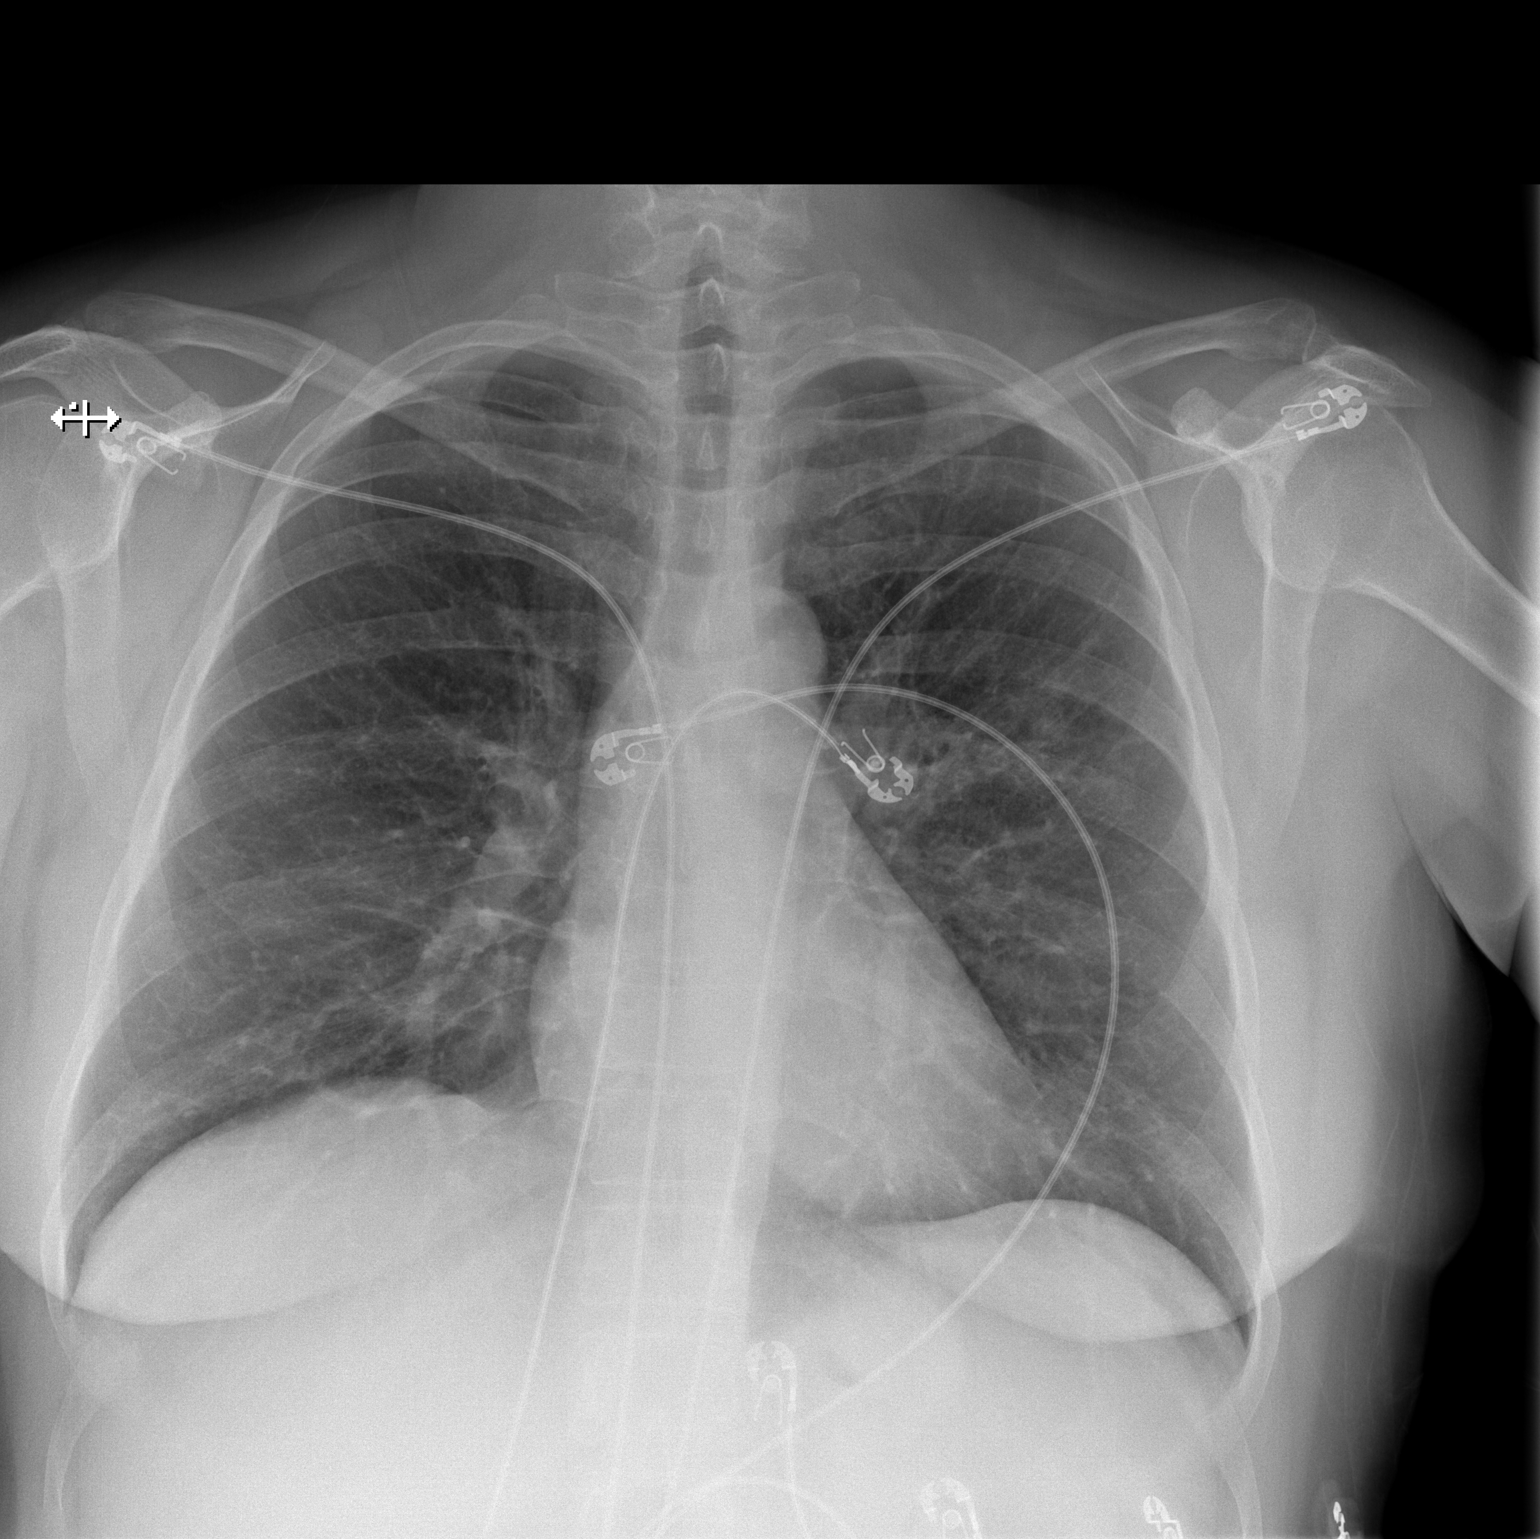

[w chest lat]
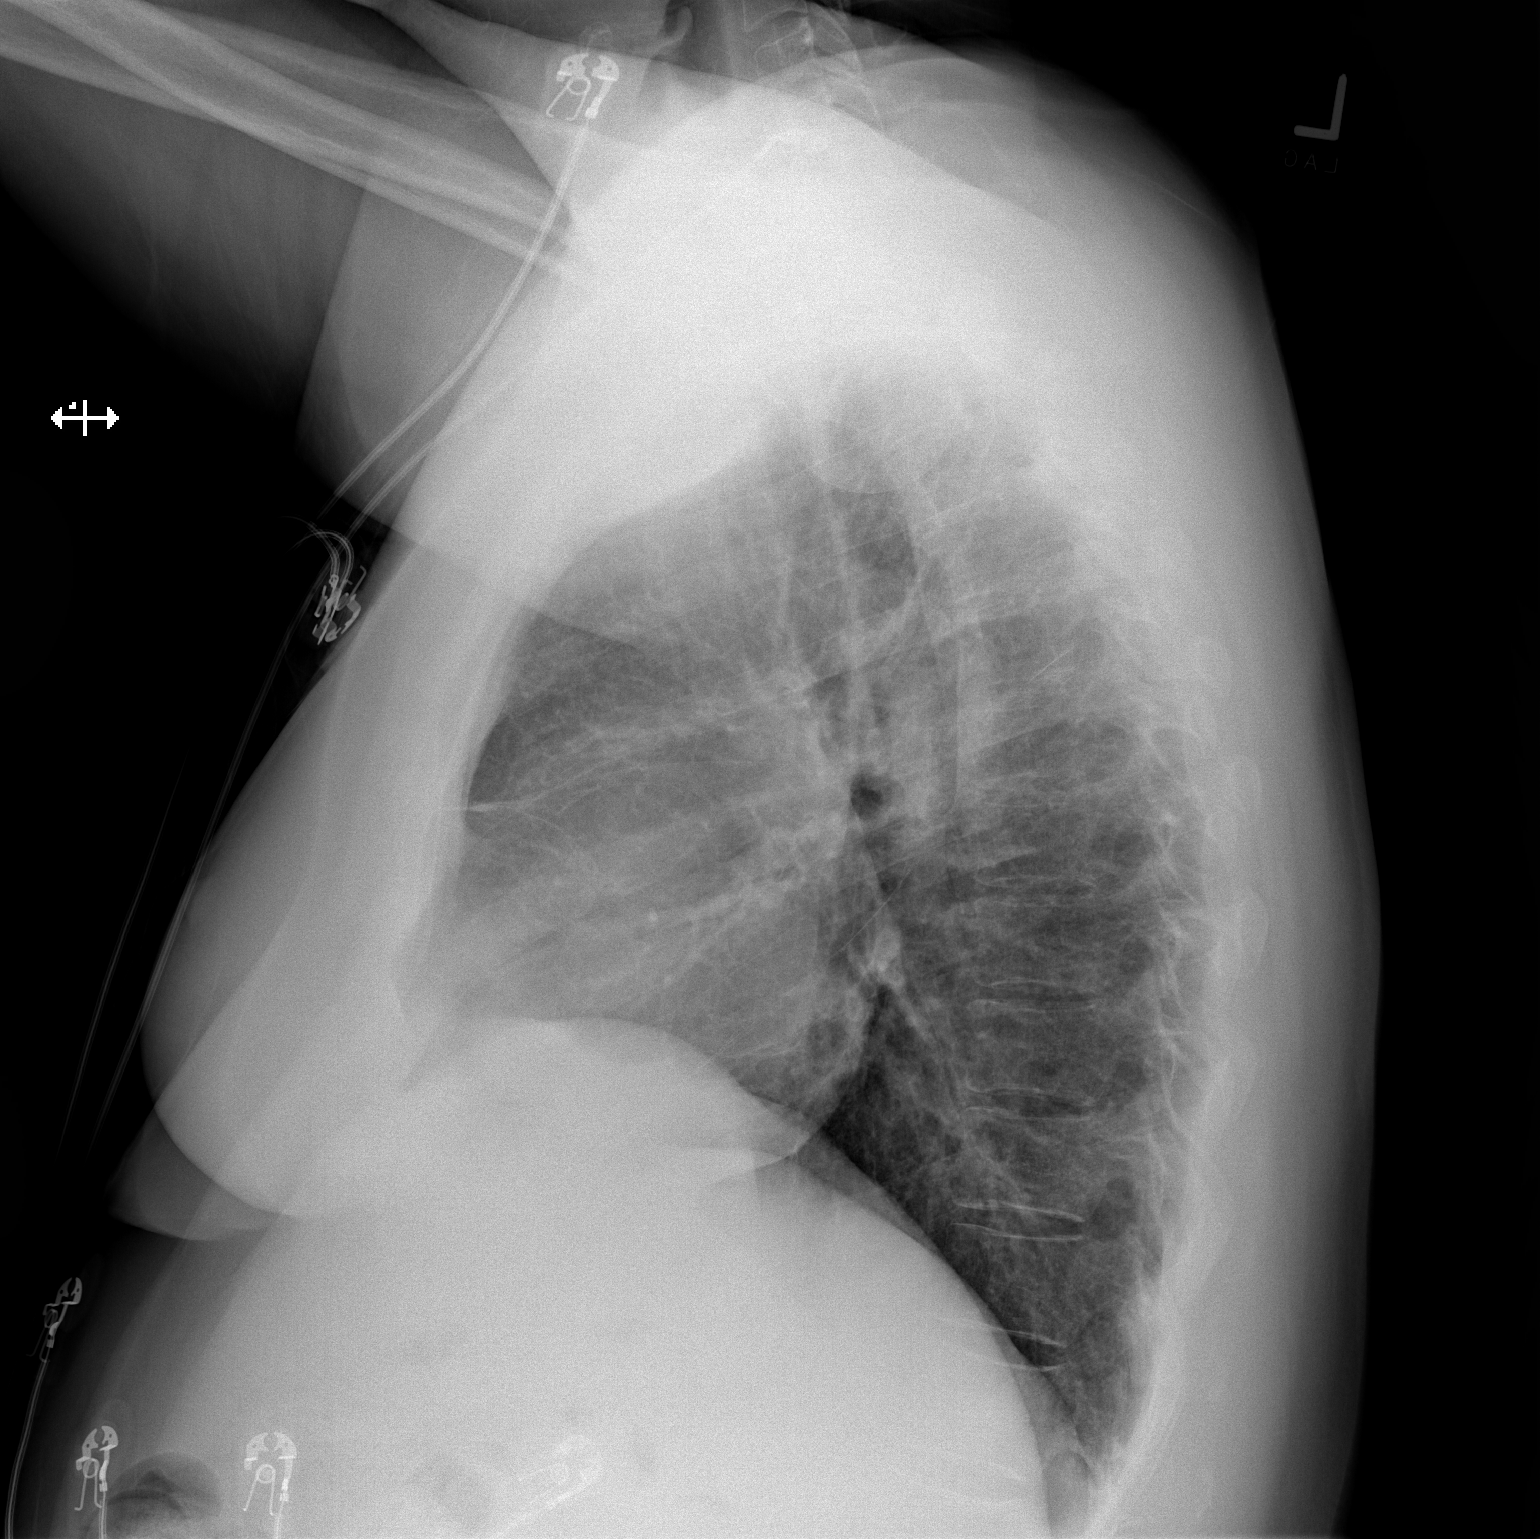

[2 of 2 positions shown; findings below may reference images not displayed]

FINDINGS: Monitoring leads overlie the patient. Normal cardiac and mediastinal
contours. No consolidative pulmonary opacities. No pleural effusion
or pneumothorax. Regional skeleton is unremarkable.
IMPRESSION: No active cardiopulmonary disease.

## 2019-12-16 LAB — SURGICAL PATHOLOGY

## 2019-12-30 DIAGNOSIS — L814 Other melanin hyperpigmentation: Secondary | ICD-10-CM | POA: Diagnosis not present

## 2019-12-30 DIAGNOSIS — D225 Melanocytic nevi of trunk: Secondary | ICD-10-CM | POA: Diagnosis not present

## 2020-03-26 DIAGNOSIS — R5383 Other fatigue: Secondary | ICD-10-CM | POA: Diagnosis not present

## 2020-03-26 DIAGNOSIS — E039 Hypothyroidism, unspecified: Secondary | ICD-10-CM | POA: Diagnosis not present

## 2020-03-31 DIAGNOSIS — I10 Essential (primary) hypertension: Secondary | ICD-10-CM | POA: Diagnosis not present

## 2020-03-31 DIAGNOSIS — R519 Headache, unspecified: Secondary | ICD-10-CM | POA: Diagnosis not present

## 2020-05-08 DIAGNOSIS — E039 Hypothyroidism, unspecified: Secondary | ICD-10-CM | POA: Diagnosis not present

## 2020-05-28 DIAGNOSIS — Z85528 Personal history of other malignant neoplasm of kidney: Secondary | ICD-10-CM | POA: Diagnosis not present

## 2020-05-28 DIAGNOSIS — C641 Malignant neoplasm of right kidney, except renal pelvis: Secondary | ICD-10-CM | POA: Diagnosis not present

## 2020-05-28 DIAGNOSIS — N281 Cyst of kidney, acquired: Secondary | ICD-10-CM | POA: Diagnosis not present

## 2020-07-09 DIAGNOSIS — N281 Cyst of kidney, acquired: Secondary | ICD-10-CM | POA: Diagnosis not present

## 2020-07-09 DIAGNOSIS — Z85528 Personal history of other malignant neoplasm of kidney: Secondary | ICD-10-CM | POA: Diagnosis not present

## 2020-08-19 DIAGNOSIS — M7062 Trochanteric bursitis, left hip: Secondary | ICD-10-CM | POA: Diagnosis not present

## 2020-09-15 DIAGNOSIS — U071 COVID-19: Secondary | ICD-10-CM | POA: Diagnosis not present

## 2020-10-22 ENCOUNTER — Other Ambulatory Visit: Payer: Self-pay | Admitting: Obstetrics & Gynecology

## 2020-10-22 DIAGNOSIS — Z1231 Encounter for screening mammogram for malignant neoplasm of breast: Secondary | ICD-10-CM

## 2020-10-29 DIAGNOSIS — F418 Other specified anxiety disorders: Secondary | ICD-10-CM | POA: Diagnosis not present

## 2020-10-29 DIAGNOSIS — Z8051 Family history of malignant neoplasm of kidney: Secondary | ICD-10-CM | POA: Diagnosis not present

## 2020-10-29 DIAGNOSIS — C641 Malignant neoplasm of right kidney, except renal pelvis: Secondary | ICD-10-CM | POA: Diagnosis not present

## 2020-10-29 DIAGNOSIS — Z905 Acquired absence of kidney: Secondary | ICD-10-CM | POA: Diagnosis not present

## 2020-10-29 DIAGNOSIS — Z23 Encounter for immunization: Secondary | ICD-10-CM | POA: Diagnosis not present

## 2020-10-29 DIAGNOSIS — Z85528 Personal history of other malignant neoplasm of kidney: Secondary | ICD-10-CM | POA: Diagnosis not present

## 2020-10-29 DIAGNOSIS — N281 Cyst of kidney, acquired: Secondary | ICD-10-CM | POA: Diagnosis not present

## 2020-10-29 DIAGNOSIS — D4102 Neoplasm of uncertain behavior of left kidney: Secondary | ICD-10-CM | POA: Diagnosis not present

## 2020-11-03 DIAGNOSIS — G4700 Insomnia, unspecified: Secondary | ICD-10-CM | POA: Diagnosis not present

## 2020-11-03 DIAGNOSIS — Z85528 Personal history of other malignant neoplasm of kidney: Secondary | ICD-10-CM | POA: Diagnosis not present

## 2020-11-19 DIAGNOSIS — E039 Hypothyroidism, unspecified: Secondary | ICD-10-CM | POA: Diagnosis not present

## 2020-11-19 DIAGNOSIS — R002 Palpitations: Secondary | ICD-10-CM | POA: Diagnosis not present

## 2020-11-21 DIAGNOSIS — R002 Palpitations: Secondary | ICD-10-CM | POA: Diagnosis not present

## 2020-11-21 DIAGNOSIS — R000 Tachycardia, unspecified: Secondary | ICD-10-CM | POA: Diagnosis not present

## 2020-11-24 ENCOUNTER — Other Ambulatory Visit: Payer: Self-pay

## 2020-11-24 ENCOUNTER — Ambulatory Visit
Admission: RE | Admit: 2020-11-24 | Discharge: 2020-11-24 | Disposition: A | Discharge status: Home / Self Care | Payer: 59 | Source: Ambulatory Visit | Attending: Obstetrics & Gynecology | Admitting: Obstetrics & Gynecology

## 2020-11-24 DIAGNOSIS — Z1231 Encounter for screening mammogram for malignant neoplasm of breast: Secondary | ICD-10-CM

## 2020-11-25 DIAGNOSIS — I10 Essential (primary) hypertension: Secondary | ICD-10-CM | POA: Diagnosis present

## 2020-11-25 DIAGNOSIS — Z79890 Hormone replacement therapy (postmenopausal): Secondary | ICD-10-CM | POA: Diagnosis not present

## 2020-11-25 DIAGNOSIS — M797 Fibromyalgia: Secondary | ICD-10-CM | POA: Diagnosis present

## 2020-11-25 DIAGNOSIS — K219 Gastro-esophageal reflux disease without esophagitis: Secondary | ICD-10-CM | POA: Diagnosis present

## 2020-11-25 DIAGNOSIS — D4102 Neoplasm of uncertain behavior of left kidney: Secondary | ICD-10-CM | POA: Diagnosis present

## 2020-11-25 DIAGNOSIS — Z90710 Acquired absence of both cervix and uterus: Secondary | ICD-10-CM | POA: Diagnosis not present

## 2020-11-25 DIAGNOSIS — F32A Depression, unspecified: Secondary | ICD-10-CM | POA: Diagnosis present

## 2020-11-25 DIAGNOSIS — Z79899 Other long term (current) drug therapy: Secondary | ICD-10-CM | POA: Diagnosis not present

## 2020-11-25 DIAGNOSIS — K589 Irritable bowel syndrome without diarrhea: Secondary | ICD-10-CM | POA: Diagnosis not present

## 2020-11-25 DIAGNOSIS — N2889 Other specified disorders of kidney and ureter: Secondary | ICD-10-CM | POA: Diagnosis not present

## 2020-11-25 DIAGNOSIS — Z7951 Long term (current) use of inhaled steroids: Secondary | ICD-10-CM | POA: Diagnosis not present

## 2020-11-25 DIAGNOSIS — E039 Hypothyroidism, unspecified: Secondary | ICD-10-CM | POA: Diagnosis present

## 2020-11-25 DIAGNOSIS — C642 Malignant neoplasm of left kidney, except renal pelvis: Secondary | ICD-10-CM | POA: Diagnosis not present

## 2020-11-30 ENCOUNTER — Other Ambulatory Visit: Payer: Self-pay | Admitting: Internal Medicine

## 2020-11-30 DIAGNOSIS — R220 Localized swelling, mass and lump, head: Secondary | ICD-10-CM | POA: Diagnosis not present

## 2020-11-30 DIAGNOSIS — R000 Tachycardia, unspecified: Secondary | ICD-10-CM | POA: Diagnosis not present

## 2020-11-30 DIAGNOSIS — Z1231 Encounter for screening mammogram for malignant neoplasm of breast: Secondary | ICD-10-CM

## 2020-12-10 DIAGNOSIS — D4102 Neoplasm of uncertain behavior of left kidney: Secondary | ICD-10-CM | POA: Diagnosis not present

## 2020-12-10 DIAGNOSIS — C641 Malignant neoplasm of right kidney, except renal pelvis: Secondary | ICD-10-CM | POA: Diagnosis not present

## 2020-12-17 DIAGNOSIS — Z1231 Encounter for screening mammogram for malignant neoplasm of breast: Secondary | ICD-10-CM

## 2020-12-29 DIAGNOSIS — L570 Actinic keratosis: Secondary | ICD-10-CM | POA: Diagnosis not present

## 2020-12-29 DIAGNOSIS — D1801 Hemangioma of skin and subcutaneous tissue: Secondary | ICD-10-CM | POA: Diagnosis not present

## 2020-12-29 DIAGNOSIS — D1723 Benign lipomatous neoplasm of skin and subcutaneous tissue of right leg: Secondary | ICD-10-CM | POA: Diagnosis not present

## 2020-12-29 DIAGNOSIS — L821 Other seborrheic keratosis: Secondary | ICD-10-CM | POA: Diagnosis not present

## 2020-12-29 DIAGNOSIS — D692 Other nonthrombocytopenic purpura: Secondary | ICD-10-CM | POA: Diagnosis not present

## 2020-12-30 ENCOUNTER — Other Ambulatory Visit: Payer: Self-pay | Admitting: Obstetrics & Gynecology

## 2020-12-30 DIAGNOSIS — Z1231 Encounter for screening mammogram for malignant neoplasm of breast: Secondary | ICD-10-CM

## 2021-01-25 DIAGNOSIS — D485 Neoplasm of uncertain behavior of skin: Secondary | ICD-10-CM | POA: Diagnosis not present

## 2021-02-22 ENCOUNTER — Ambulatory Visit
Admission: RE | Admit: 2021-02-22 | Discharge: 2021-02-22 | Disposition: A | Discharge status: Home / Self Care | Payer: 59 | Source: Ambulatory Visit

## 2021-02-22 ENCOUNTER — Other Ambulatory Visit: Payer: Self-pay

## 2021-02-22 DIAGNOSIS — Z1231 Encounter for screening mammogram for malignant neoplasm of breast: Secondary | ICD-10-CM

## 2021-02-23 DIAGNOSIS — M542 Cervicalgia: Secondary | ICD-10-CM | POA: Diagnosis not present

## 2021-03-06 DIAGNOSIS — M5412 Radiculopathy, cervical region: Secondary | ICD-10-CM | POA: Diagnosis not present

## 2021-03-11 DIAGNOSIS — C641 Malignant neoplasm of right kidney, except renal pelvis: Secondary | ICD-10-CM | POA: Diagnosis not present

## 2021-03-11 DIAGNOSIS — D4102 Neoplasm of uncertain behavior of left kidney: Secondary | ICD-10-CM | POA: Diagnosis not present

## 2021-03-11 DIAGNOSIS — N281 Cyst of kidney, acquired: Secondary | ICD-10-CM | POA: Diagnosis not present

## 2021-03-11 DIAGNOSIS — Z85528 Personal history of other malignant neoplasm of kidney: Secondary | ICD-10-CM | POA: Diagnosis not present

## 2021-03-11 DIAGNOSIS — Z08 Encounter for follow-up examination after completed treatment for malignant neoplasm: Secondary | ICD-10-CM | POA: Diagnosis not present

## 2021-03-12 DIAGNOSIS — M542 Cervicalgia: Secondary | ICD-10-CM | POA: Diagnosis not present

## 2021-03-30 DIAGNOSIS — L239 Allergic contact dermatitis, unspecified cause: Secondary | ICD-10-CM | POA: Diagnosis not present

## 2021-04-05 DIAGNOSIS — M542 Cervicalgia: Secondary | ICD-10-CM | POA: Diagnosis not present

## 2021-04-21 DIAGNOSIS — M542 Cervicalgia: Secondary | ICD-10-CM | POA: Diagnosis not present

## 2021-04-26 DIAGNOSIS — Z86010 Personal history of colonic polyps: Secondary | ICD-10-CM | POA: Diagnosis not present

## 2021-04-26 DIAGNOSIS — Z8371 Family history of colonic polyps: Secondary | ICD-10-CM | POA: Diagnosis not present

## 2021-04-26 DIAGNOSIS — R198 Other specified symptoms and signs involving the digestive system and abdomen: Secondary | ICD-10-CM | POA: Diagnosis not present

## 2021-07-21 DIAGNOSIS — R7301 Impaired fasting glucose: Secondary | ICD-10-CM | POA: Diagnosis not present

## 2021-07-21 DIAGNOSIS — E039 Hypothyroidism, unspecified: Secondary | ICD-10-CM | POA: Diagnosis not present

## 2021-07-21 DIAGNOSIS — I10 Essential (primary) hypertension: Secondary | ICD-10-CM | POA: Diagnosis not present

## 2021-07-21 DIAGNOSIS — E559 Vitamin D deficiency, unspecified: Secondary | ICD-10-CM | POA: Diagnosis not present

## 2021-09-07 DIAGNOSIS — M797 Fibromyalgia: Secondary | ICD-10-CM | POA: Diagnosis not present

## 2021-09-07 DIAGNOSIS — E039 Hypothyroidism, unspecified: Secondary | ICD-10-CM | POA: Diagnosis not present

## 2021-11-05 DIAGNOSIS — E039 Hypothyroidism, unspecified: Secondary | ICD-10-CM | POA: Diagnosis not present

## 2021-12-17 DIAGNOSIS — M79671 Pain in right foot: Secondary | ICD-10-CM | POA: Diagnosis not present

## 2021-12-27 DIAGNOSIS — Z79899 Other long term (current) drug therapy: Secondary | ICD-10-CM | POA: Diagnosis not present

## 2021-12-27 DIAGNOSIS — L309 Dermatitis, unspecified: Secondary | ICD-10-CM | POA: Diagnosis not present

## 2021-12-27 DIAGNOSIS — L509 Urticaria, unspecified: Secondary | ICD-10-CM | POA: Diagnosis not present

## 2022-01-26 DIAGNOSIS — E039 Hypothyroidism, unspecified: Secondary | ICD-10-CM | POA: Diagnosis not present

## 2022-01-26 DIAGNOSIS — F322 Major depressive disorder, single episode, severe without psychotic features: Secondary | ICD-10-CM | POA: Diagnosis not present

## 2022-03-09 DIAGNOSIS — F325 Major depressive disorder, single episode, in full remission: Secondary | ICD-10-CM | POA: Diagnosis not present

## 2022-03-09 DIAGNOSIS — E039 Hypothyroidism, unspecified: Secondary | ICD-10-CM | POA: Diagnosis not present

## 2022-03-10 DIAGNOSIS — C641 Malignant neoplasm of right kidney, except renal pelvis: Secondary | ICD-10-CM | POA: Diagnosis not present

## 2022-03-10 DIAGNOSIS — Z905 Acquired absence of kidney: Secondary | ICD-10-CM | POA: Diagnosis not present

## 2022-03-10 DIAGNOSIS — Z85528 Personal history of other malignant neoplasm of kidney: Secondary | ICD-10-CM | POA: Diagnosis not present

## 2022-03-10 DIAGNOSIS — Z08 Encounter for follow-up examination after completed treatment for malignant neoplasm: Secondary | ICD-10-CM | POA: Diagnosis not present

## 2022-03-11 ENCOUNTER — Other Ambulatory Visit: Payer: Self-pay | Admitting: Physician Assistant

## 2022-03-11 DIAGNOSIS — Z1231 Encounter for screening mammogram for malignant neoplasm of breast: Secondary | ICD-10-CM

## 2022-03-17 DIAGNOSIS — E039 Hypothyroidism, unspecified: Secondary | ICD-10-CM | POA: Diagnosis not present

## 2022-03-25 ENCOUNTER — Ambulatory Visit
Admission: RE | Admit: 2022-03-25 | Discharge: 2022-03-25 | Disposition: A | Discharge status: Home / Self Care | Payer: Medicare Other | Source: Ambulatory Visit | Attending: Physician Assistant | Admitting: Physician Assistant

## 2022-03-25 DIAGNOSIS — Z1231 Encounter for screening mammogram for malignant neoplasm of breast: Secondary | ICD-10-CM | POA: Diagnosis not present

## 2023-01-03 ENCOUNTER — Other Ambulatory Visit: Payer: Self-pay | Admitting: Physician Assistant

## 2023-01-03 DIAGNOSIS — Z1231 Encounter for screening mammogram for malignant neoplasm of breast: Secondary | ICD-10-CM
